# Patient Record
Sex: Male | Born: 1960 | Race: White | Hispanic: Yes | Marital: Married | Smoking: Never smoker
Health system: Southern US, Community
[De-identification: ages and names within clinical notes are randomized; demographics above are authoritative.]

## PROBLEM LIST (undated history)

## (undated) DIAGNOSIS — I10 Essential (primary) hypertension: Secondary | ICD-10-CM

---

## 2018-07-01 ENCOUNTER — Other Ambulatory Visit: Payer: Self-pay

## 2018-07-01 DIAGNOSIS — R6889 Other general symptoms and signs: Secondary | ICD-10-CM

## 2018-07-01 NOTE — Progress Notes (Unsigned)
[place] 

## 2018-07-07 LAB — NOVEL CORONAVIRUS, NAA: SARS-CoV-2, NAA: NOT DETECTED

## 2020-03-24 ENCOUNTER — Emergency Department (HOSPITAL_COMMUNITY): Payer: Self-pay

## 2020-03-24 ENCOUNTER — Encounter (HOSPITAL_COMMUNITY): Payer: Self-pay | Admitting: Emergency Medicine

## 2020-03-24 ENCOUNTER — Other Ambulatory Visit: Payer: Self-pay

## 2020-03-24 ENCOUNTER — Inpatient Hospital Stay (HOSPITAL_COMMUNITY)
Admission: EM | Admit: 2020-03-24 | Discharge: 2020-03-29 | DRG: 871 | Disposition: A | Discharge status: Home / Self Care | Payer: Self-pay | Attending: Internal Medicine | Admitting: Internal Medicine

## 2020-03-24 DIAGNOSIS — R7989 Other specified abnormal findings of blood chemistry: Secondary | ICD-10-CM | POA: Diagnosis present

## 2020-03-24 DIAGNOSIS — A044 Other intestinal Escherichia coli infections: Secondary | ICD-10-CM | POA: Diagnosis present

## 2020-03-24 DIAGNOSIS — Z20822 Contact with and (suspected) exposure to covid-19: Secondary | ICD-10-CM | POA: Diagnosis present

## 2020-03-24 DIAGNOSIS — R911 Solitary pulmonary nodule: Secondary | ICD-10-CM | POA: Diagnosis present

## 2020-03-24 DIAGNOSIS — A419 Sepsis, unspecified organism: Secondary | ICD-10-CM | POA: Diagnosis present

## 2020-03-24 DIAGNOSIS — E872 Acidosis: Secondary | ICD-10-CM | POA: Diagnosis present

## 2020-03-24 DIAGNOSIS — R972 Elevated prostate specific antigen [PSA]: Secondary | ICD-10-CM | POA: Diagnosis present

## 2020-03-24 DIAGNOSIS — N179 Acute kidney failure, unspecified: Secondary | ICD-10-CM | POA: Diagnosis present

## 2020-03-24 DIAGNOSIS — E669 Obesity, unspecified: Secondary | ICD-10-CM | POA: Diagnosis present

## 2020-03-24 DIAGNOSIS — D649 Anemia, unspecified: Secondary | ICD-10-CM | POA: Diagnosis present

## 2020-03-24 DIAGNOSIS — R778 Other specified abnormalities of plasma proteins: Secondary | ICD-10-CM | POA: Diagnosis present

## 2020-03-24 DIAGNOSIS — K8689 Other specified diseases of pancreas: Secondary | ICD-10-CM | POA: Diagnosis present

## 2020-03-24 DIAGNOSIS — K869 Disease of pancreas, unspecified: Secondary | ICD-10-CM | POA: Diagnosis present

## 2020-03-24 DIAGNOSIS — Z6831 Body mass index (BMI) 31.0-31.9, adult: Secondary | ICD-10-CM

## 2020-03-24 DIAGNOSIS — I248 Other forms of acute ischemic heart disease: Secondary | ICD-10-CM | POA: Diagnosis present

## 2020-03-24 DIAGNOSIS — A4152 Sepsis due to Pseudomonas: Secondary | ICD-10-CM | POA: Diagnosis present

## 2020-03-24 DIAGNOSIS — K529 Noninfective gastroenteritis and colitis, unspecified: Secondary | ICD-10-CM

## 2020-03-24 DIAGNOSIS — E871 Hypo-osmolality and hyponatremia: Secondary | ICD-10-CM | POA: Diagnosis present

## 2020-03-24 DIAGNOSIS — I10 Essential (primary) hypertension: Secondary | ICD-10-CM | POA: Diagnosis present

## 2020-03-24 DIAGNOSIS — E876 Hypokalemia: Secondary | ICD-10-CM | POA: Diagnosis present

## 2020-03-24 DIAGNOSIS — A4151 Sepsis due to Escherichia coli [E. coli]: Principal | ICD-10-CM | POA: Diagnosis present

## 2020-03-24 DIAGNOSIS — R6521 Severe sepsis with septic shock: Secondary | ICD-10-CM | POA: Diagnosis present

## 2020-03-24 DIAGNOSIS — E861 Hypovolemia: Secondary | ICD-10-CM | POA: Diagnosis present

## 2020-03-24 HISTORY — DX: Essential (primary) hypertension: I10

## 2020-03-24 LAB — LACTIC ACID, PLASMA: Lactic Acid, Venous: 5.6 mmol/L (ref 0.5–1.9)

## 2020-03-24 LAB — COMPREHENSIVE METABOLIC PANEL
ALT: 93 U/L — ABNORMAL HIGH (ref 0–44)
AST: 137 U/L — ABNORMAL HIGH (ref 15–41)
Albumin: 3.2 g/dL — ABNORMAL LOW (ref 3.5–5.0)
Alkaline Phosphatase: 49 U/L (ref 38–126)
Anion gap: 12 (ref 5–15)
BUN: 21 mg/dL — ABNORMAL HIGH (ref 6–20)
CO2: 19 mmol/L — ABNORMAL LOW (ref 22–32)
Calcium: 8.3 mg/dL — ABNORMAL LOW (ref 8.9–10.3)
Chloride: 101 mmol/L (ref 98–111)
Creatinine, Ser: 1.98 mg/dL — ABNORMAL HIGH (ref 0.61–1.24)
GFR, Estimated: 39 mL/min — ABNORMAL LOW (ref >60)
Glucose, Bld: 113 mg/dL — ABNORMAL HIGH (ref 70–99)
Potassium: 3.2 mmol/L — ABNORMAL LOW (ref 3.5–5.1)
Sodium: 132 mmol/L — ABNORMAL LOW (ref 135–145)
Total Bilirubin: 0.8 mg/dL (ref 0.3–1.2)
Total Protein: 6.5 g/dL (ref 6.5–8.1)

## 2020-03-24 LAB — TYPE AND SCREEN
ABO/RH(D): O POS
Antibody Screen: NEGATIVE

## 2020-03-24 LAB — TROPONIN I (HIGH SENSITIVITY): Troponin I (High Sensitivity): 1266 ng/L (ref <18)

## 2020-03-24 LAB — CBC WITH DIFFERENTIAL/PLATELET
Abs Immature Granulocytes: 0.2 10*3/uL — ABNORMAL HIGH (ref 0.00–0.07)
Basophils Absolute: 0.1 10*3/uL (ref 0.0–0.1)
Basophils Relative: 1 %
Eosinophils Absolute: 0 10*3/uL (ref 0.0–0.5)
Eosinophils Relative: 1 %
HCT: 46.4 % (ref 39.0–52.0)
Hemoglobin: 16.2 g/dL (ref 13.0–17.0)
Immature Granulocytes: 3 %
Lymphocytes Relative: 22 %
Lymphs Abs: 1.3 10*3/uL (ref 0.7–4.0)
MCH: 32.9 pg (ref 26.0–34.0)
MCHC: 34.9 g/dL (ref 30.0–36.0)
MCV: 94.3 fL (ref 80.0–100.0)
Monocytes Absolute: 0.1 10*3/uL (ref 0.1–1.0)
Monocytes Relative: 2 %
Neutro Abs: 4.1 10*3/uL (ref 1.7–7.7)
Neutrophils Relative %: 71 %
Platelets: 189 10*3/uL (ref 150–400)
RBC: 4.92 MIL/uL (ref 4.22–5.81)
RDW: 13.3 % (ref 11.5–15.5)
WBC: 5.6 10*3/uL (ref 4.0–10.5)
nRBC: 0 % (ref 0.0–0.2)

## 2020-03-24 LAB — I-STAT CHEM 8, ED
BUN: 22 mg/dL — ABNORMAL HIGH (ref 6–20)
Calcium, Ion: 1.14 mmol/L — ABNORMAL LOW (ref 1.15–1.40)
Chloride: 100 mmol/L (ref 98–111)
Creatinine, Ser: 1.8 mg/dL — ABNORMAL HIGH (ref 0.61–1.24)
Glucose, Bld: 106 mg/dL — ABNORMAL HIGH (ref 70–99)
HCT: 40 % (ref 39.0–52.0)
Hemoglobin: 13.6 g/dL (ref 13.0–17.0)
Potassium: 3.3 mmol/L — ABNORMAL LOW (ref 3.5–5.1)
Sodium: 133 mmol/L — ABNORMAL LOW (ref 135–145)
TCO2: 20 mmol/L — ABNORMAL LOW (ref 22–32)

## 2020-03-24 LAB — PROTIME-INR
INR: 1 (ref 0.8–1.2)
Prothrombin Time: 13 seconds (ref 11.4–15.2)

## 2020-03-24 LAB — APTT: aPTT: 22 seconds — ABNORMAL LOW (ref 24–36)

## 2020-03-24 LAB — ABO/RH: ABO/RH(D): O POS

## 2020-03-24 MED ORDER — ACETAMINOPHEN 650 MG RE SUPP: 650.0000 mg | Freq: Four times a day (QID) | RECTAL | Status: DC | PRN

## 2020-03-24 MED ORDER — SODIUM CHLORIDE 0.9 % IV SOLN: INTRAVENOUS | Status: DC

## 2020-03-24 MED ORDER — ACETAMINOPHEN 325 MG PO TABS
650.0000 mg | ORAL_TABLET | Freq: Four times a day (QID) | ORAL | Status: DC | PRN
  Administered 2020-03-27 – 2020-03-28 (×4): 650 mg via ORAL
  Filled 2020-03-24 (×5): qty 2

## 2020-03-24 MED ORDER — ONDANSETRON HCL 4 MG/2ML IJ SOLN: 4.0000 mg | Freq: Four times a day (QID) | INTRAMUSCULAR | Status: DC | PRN

## 2020-03-24 MED ORDER — VANCOMYCIN HCL IN DEXTROSE 1-5 GM/200ML-% IV SOLN
1000.0000 mg | Freq: Once | INTRAVENOUS | Status: AC
  Administered 2020-03-25: 01:00:00 1000 mg via INTRAVENOUS
  Filled 2020-03-24: qty 200

## 2020-03-24 MED ORDER — ACETAMINOPHEN 500 MG PO TABS
1000.0000 mg | ORAL_TABLET | Freq: Once | ORAL | Status: AC
  Administered 2020-03-25: 1000 mg via ORAL
  Filled 2020-03-24: qty 2

## 2020-03-24 MED ORDER — ONDANSETRON HCL 4 MG PO TABS: 4.0000 mg | ORAL_TABLET | Freq: Four times a day (QID) | ORAL | Status: DC | PRN

## 2020-03-24 MED ORDER — LACTATED RINGERS IV BOLUS
1000.0000 mL | Freq: Once | INTRAVENOUS | Status: AC
  Administered 2020-03-25: 01:00:00 1000 mL via INTRAVENOUS

## 2020-03-24 MED ORDER — SODIUM CHLORIDE 0.9 % IV BOLUS
1000.0000 mL | Freq: Once | INTRAVENOUS | Status: AC
  Administered 2020-03-24: 1000 mL via INTRAVENOUS

## 2020-03-24 MED ORDER — SODIUM CHLORIDE 0.9 % IV SOLN
1.0000 g | Freq: Once | INTRAVENOUS | Status: DC
  Filled 2020-03-24: qty 10

## 2020-03-24 MED ORDER — SODIUM CHLORIDE 0.9 % IV SOLN
2.0000 g | Freq: Once | INTRAVENOUS | Status: AC
  Administered 2020-03-25: 01:00:00 2 g via INTRAVENOUS
  Filled 2020-03-24: qty 2

## 2020-03-24 MED ORDER — IOHEXOL 350 MG/ML SOLN
100.0000 mL | Freq: Once | INTRAVENOUS | Status: AC | PRN
  Administered 2020-03-24: 100 mL via INTRAVENOUS

## 2020-03-24 MED ORDER — ENOXAPARIN SODIUM 40 MG/0.4ML ~~LOC~~ SOLN
40.0000 mg | SUBCUTANEOUS | Status: DC
  Administered 2020-03-25 – 2020-03-27 (×3): 40 mg via SUBCUTANEOUS
  Filled 2020-03-24 (×4): qty 0.4

## 2020-03-24 MED ORDER — METRONIDAZOLE 500 MG PO TABS
500.0000 mg | ORAL_TABLET | Freq: Three times a day (TID) | ORAL | Status: DC
  Administered 2020-03-25 – 2020-03-29 (×12): 500 mg via ORAL
  Filled 2020-03-24 (×12): qty 1

## 2020-03-24 NOTE — ED Provider Notes (Signed)
11:53 PM Assumed care.  Patient here with rigors, diaphoresis, hypotension.  Concern for possible sepsis.  CT showed no dissection or large pulmonary embolus.  Rectal temperature here 101.3.  Blood pressure improving with IV fluids.  He has received 30 mL/kg IV fluid bolus.  Broad-spectrum antibiotics have been ordered.  Unclear source at this time - ? colitis.  CT scan shows possible colitis and he has had copious diarrhea here per nursing staff but is on stool softeners per previous provider.  We will add on stool studies. Troponin also elevated but patient complains of no chest pain and EKG is nonischemic.  Likely secondary to sepsis.  Urine, blood cultures, Covid pending.     Discussed patient's case with hospitalist, Dr. Julian Reil.  I have recommended admission and patient (and family if present) agree with this plan. Admitting physician will place admission orders.   I reviewed all nursing notes, vitals, pertinent previous records and reviewed/interpreted all EKGs, lab and urine results, imaging (as available).     EKG Interpretation  Date/Time:  Friday March 24 2020 23:46:44 EST Ventricular Rate:  98 PR Interval:    QRS Duration: 107 QT Interval:  333 QTC Calculation: 426 R Axis:   29 Text Interpretation: Sinus rhythm Rate improved compared to prior Confirmed by Timya Trimmer, Baxter Hire [phone]) on 03/24/2020 11:49:11 PM       CRITICAL CARE Performed by: Baxter Hire Amarra Sawyer   Total critical care time: 35 minutes  Critical care time was exclusive of separately billable procedures and treating other patients.  Critical care was necessary to treat or prevent imminent or life-threatening deterioration.  Critical care was time spent personally by me on the following activities: development of treatment plan with patient and/or surrogate as well as nursing, discussions with consultants, evaluation of patient's response to treatment, examination of patient, obtaining history from patient or surrogate,  ordering and performing treatments and interventions, ordering and review of laboratory studies, ordering and review of radiographic studies, pulse oximetry and re-evaluation of patient's condition.    Ginger Leeth, Layla Maw, DO 03/25/20 0005

## 2020-03-24 NOTE — ED Notes (Signed)
Date and time results received: 03/24/20 Test: Lactic Critical Value: 5.6 Name of Provider Notified: Hyacinth Meeker

## 2020-03-24 NOTE — ED Notes (Signed)
Pt transported to CT at this time.

## 2020-03-24 NOTE — ED Provider Notes (Signed)
[facility] EMERGENCY DEPARTMENT Provider Note   CSN: [id number] Arrival date & time: 03/24/20  2056     History Chief Complaint  Patient presents with  . Near Syncope    Pt brought to ED by EMS from home for near syncope episode, AMS and CP, pt very anxious, and diaphoretic on arrival to ED. HR 130, BP 80/50, SPO2 90 on RA, 98% 2L [place], CBG 130    Perry Zamora is a 59 y.o. male.  HPI   Patient is an ill-appearing 59 year old male arriving by paramedic transport after being found to be tachycardic at home, he had a near syncopal episode, the patient was diaphoretic, tachycardic to 140, hypotensive at 80/50 and oxygen of 90% on room air.  He reports that he has had some recent back discomfort, he feels like he is trembling all over, very shaky, having a hard time breathing and occasional chest pain though he is not having any chest pain at this time.  He reports having mild hypertension for which he takes a small amount of blood pressure medication, cannot recall the name, he is not a diabetic, he does not smoke, he was a heavy drinker until 6 months ago when he went and stopped drinking.  He is not had any alcohol in 6 months.   Past Medical History:  Diagnosis Date  . HTN (hypertension)     Patient Active Problem List   Diagnosis Date Noted  . Pulmonary nodule 03/25/2020  . 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 AKI (acute kidney injury) (HCC) 03/24/2020  . Elevated troponin 03/24/2020  . Severe sepsis with septic shock (HCC) 03/24/2020    History reviewed. No pertinent surgical history.     History reviewed. No pertinent family history.  Social History   Tobacco Use  . Smoking status: Never Smoker  . Smokeless tobacco: Never Used  Vaping Use  . Vaping Use: Never used  Substance Use Topics  . Alcohol use: Not Currently    Comment: heavy until Quit 6 months ago  . Drug use: Never    Home Medications Prior to Admission medications   Medication Sig  Start Date End Date Taking? Authorizing Provider  hydroxypropyl methylcellulose / hypromellose (ISOPTO TEARS / GONIOVISC) 2.5 % ophthalmic solution Place 1 drop into both eyes 4 (four) times daily as needed for dry eyes.   Yes [provider]  lisinopril-hydrochlorothiazide (ZESTORETIC) 10-12.5 MG tablet Take 1 tablet by mouth daily. 11/14/19  Yes [provider]    Allergies    Patient has no known allergies.  Review of Systems   Review of Systems  All other systems reviewed and are negative.   Physical Exam Updated Vital Signs BP (!) 143/102 (BP Location: Left Arm)   Pulse 61   Temp 98.7 F (37.1 C) (Oral)   Resp 17   Ht 1.6 m (5\' 3" )   Wt 79.1 kg   SpO2 98%   BMI 30.89 kg/m   Physical Exam Vitals and nursing note reviewed.  Constitutional:      General: He is not in acute distress.    Appearance: He is well-developed and well-nourished. He is ill-appearing and diaphoretic.  HENT:     Head: Normocephalic and atraumatic.     Mouth/Throat:     Mouth: Oropharynx is clear and moist.     Pharynx: No oropharyngeal exudate.  Eyes:     General: No scleral icterus.       Right eye: No discharge.  Left eye: No discharge.     Extraocular Movements: EOM normal.     Conjunctiva/sclera: Conjunctivae normal.     Pupils: Pupils are equal, round, and reactive to light.  Neck:     Thyroid: No thyromegaly.     Vascular: No JVD.  Cardiovascular:     Rate and Rhythm: Regular rhythm. Tachycardia present.     Pulses: Intact distal pulses.     Heart sounds: Normal heart sounds. No murmur heard. No friction rub. No gallop.   Pulmonary:     Effort: Pulmonary effort is normal. No respiratory distress.     Breath sounds: Normal breath sounds. No wheezing or rales.  Abdominal:     General: Bowel sounds are normal. There is no distension.     Palpations: Abdomen is soft. There is no mass.     Tenderness: There is no abdominal tenderness.  Musculoskeletal:         General: No tenderness or edema. Normal range of motion.     Cervical back: Normal range of motion and neck supple.  Lymphadenopathy:     Cervical: No cervical adenopathy.  Skin:    General: Skin is warm.     Findings: No erythema or rash.  Neurological:     Mental Status: He is alert.     Coordination: Coordination normal.  Psychiatric:        Mood and Affect: Mood and affect normal.        Behavior: Behavior normal.     ED Results / Procedures / Treatments   Labs (all labs ordered are listed, but only abnormal results are displayed) Labs Reviewed  BLOOD CULTURE ID PANEL (REFLEXED) - BCID2 - Abnormal; Notable for the following components:      Result Value   Enterobacterales DETECTED (*)    Escherichia coli DETECTED (*)    Pseudomonas aeruginosa DETECTED (*)    All other components within normal limits  LACTIC ACID, PLASMA - Abnormal; Notable for the following components:   Lactic Acid, Venous 5.6 (*)    All other components within normal limits  CBC WITH DIFFERENTIAL/PLATELET - Abnormal; Notable for the following components:   Abs Immature Granulocytes 0.20 (*)    All other components within normal limits  URINALYSIS, ROUTINE W REFLEX MICROSCOPIC - Abnormal; Notable for the following components:   APPearance HAZY (*)    Specific Gravity, Urine >1.046 (*)    Hgb urine dipstick MODERATE (*)    Protein, ur 30 (*)    All other components within normal limits  APTT - Abnormal; Notable for the following components:   aPTT 22 (*)    All other components within normal limits  COMPREHENSIVE METABOLIC PANEL - Abnormal; Notable for the following components:   Sodium 132 (*)    Potassium 3.2 (*)    CO2 19 (*)    Glucose, Bld 113 (*)    BUN 21 (*)    Creatinine, Ser 1.98 (*)    Calcium 8.3 (*)    Albumin 3.2 (*)    AST 137 (*)    ALT 93 (*)    GFR, Estimated 39 (*)    All other components within normal limits  LACTIC ACID, PLASMA - Abnormal; Notable for the following  components:   Lactic Acid, Venous 2.0 (*)    All other components within normal limits  CBC - Abnormal; Notable for the following components:   RBC 3.93 (*)    HCT 37.3 (*)    All other components  within normal limits  COMPREHENSIVE METABOLIC PANEL - Abnormal; Notable for the following components:   Sodium 131 (*)    CO2 21 (*)    Glucose, Bld 120 (*)    Creatinine, Ser 1.60 (*)    Calcium 8.6 (*)    Albumin 3.2 (*)    AST 272 (*)    ALT 162 (*)    GFR, Estimated 50 (*)    All other components within normal limits  CBC - Abnormal; Notable for the following components:   RBC 3.70 (*)    Hemoglobin 11.9 (*)    HCT 35.5 (*)    All other components within normal limits  COMPREHENSIVE METABOLIC PANEL - Abnormal; Notable for the following components:   Sodium 134 (*)    CO2 19 (*)    Calcium 8.4 (*)    Total Protein 6.1 (*)    Albumin 2.9 (*)    AST 311 (*)    ALT 348 (*)    All other components within normal limits  HEMOGLOBIN A1C - Abnormal; Notable for the following components:   Hgb A1c MFr Bld 5.7 (*)    All other components within normal limits  CBC - Abnormal; Notable for the following components:   RBC 3.75 (*)    Hemoglobin 12.3 (*)    HCT 35.4 (*)    All other components within normal limits  COMPREHENSIVE METABOLIC PANEL - Abnormal; Notable for the following components:   Glucose, Bld 108 (*)    Calcium 8.8 (*)    Total Protein 6.2 (*)    Albumin 3.0 (*)    AST 129 (*)    ALT 228 (*)    All other components within normal limits  I-STAT CHEM 8, ED - Abnormal; Notable for the following components:   Sodium 133 (*)    Potassium 3.3 (*)    BUN 22 (*)    Creatinine, Ser 1.80 (*)    Glucose, Bld 106 (*)    Calcium, Ion 1.14 (*)    TCO2 20 (*)    All other components within normal limits  TROPONIN I (HIGH SENSITIVITY) - Abnormal; Notable for the following components:   Troponin I (High Sensitivity) 1,266 (*)    All other components within normal limits   TROPONIN I (HIGH SENSITIVITY) - Abnormal; Notable for the following components:   Troponin I (High Sensitivity) 1,390 (*)    All other components within normal limits  TROPONIN I (HIGH SENSITIVITY) - Abnormal; Notable for the following components:   Troponin I (High Sensitivity) 160 (*)    All other components within normal limits  CULTURE, BLOOD (ROUTINE X 2)  CULTURE, BLOOD (ROUTINE X 2)  RESP PANEL BY RT-PCR (FLU A&B, COVID) ARPGX2  GASTROINTESTINAL PANEL BY PCR, STOOL (REPLACES STOOL CULTURE)  C DIFFICILE QUICK SCREEN W PCR REFLEX  URINE CULTURE  MRSA PCR SCREENING  PROTIME-INR  LACTIC ACID, PLASMA  HIV ANTIBODY (ROUTINE TESTING W REFLEX)  PROTIME-INR  CORTISOL-AM, BLOOD  PROCALCITONIN  MAGNESIUM  HEPATITIS PANEL, ACUTE  PROCALCITONIN  PROCALCITONIN  TYPE AND SCREEN  ABO/RH    EKG EKG Interpretation  Date/Time:  Friday March 24 2020 23:46:44 EST Ventricular Rate:  98 PR Interval:    QRS Duration: 107 QT Interval:  333 QTC Calculation: 426 R Axis:   29 Text Interpretation: Sinus rhythm Rate improved compared to prior Confirmed by Rochele RaringWard, Kristen [phone]([id number]) on 03/24/2020 11:49:11 PM Also confirmed by Ward, Baxter HireKristen [phone]), editor Erenest RasherWray, Angela ([phone])  on 03/25/2020 8:17:00  AM   Radiology MR ABDOMEN W WO CONTRAST  Result Date: 03/26/2020 CLINICAL DATA:  Characterize pancreatic lesion EXAM: MRI ABDOMEN WITHOUT AND WITH CONTRAST TECHNIQUE: Multiplanar multisequence MR imaging of the abdomen was performed both before and after the administration of intravenous contrast. CONTRAST:  29mL GADAVIST GADOBUTROL 1 MMOL/ML IV SOLN COMPARISON:  CT chest abdomen pelvis, 03/24/2020 FINDINGS: Lower chest: No acute findings. Hepatobiliary: Hepatic steatosis. No mass or other parenchymal abnormality identified. No biliary ductal dilatation. Pancreas: There is no mass or abnormal contrast enhancement in the central pancreatic head to correlate to finding of prior CT (series 21, image 57).  No other evidence of mass, inflammatory changes, or other parenchymal abnormality identified. No pancreatic ductal dilatation. Spleen:  Within normal limits in size and appearance. Adrenals/Urinary Tract: No masses identified. No evidence of hydronephrosis. Stomach/Bowel: Colonic diverticula. Visualized portions within the abdomen are otherwise unremarkable. Vascular/Lymphatic: No pathologically enlarged lymph nodes identified. No abdominal aortic aneurysm demonstrated. Other:  None. Musculoskeletal: No suspicious bone lesions identified. IMPRESSION: 1. No pancreatic mass or other significant abnormality identified. There is no MR correlate for a small lesion in the central pancreatic head suspected to be arterially hyperenhancing on prior CT, although not included in noncontrast images of the chest performed on that examination. In particular there is no focal signal abnormality or hyperenhancement on multiphasic contrast enhanced examination, and this finding on prior CT is of uncertain significance, possibly reflecting a small focus of calcification in the pancreatic parenchyma. In the absence of any suspicious MR finding, a pancreatic neuroendocrine tumor is not favored, and there are no concerning secondary features such as lymphadenopathy or pancreatic ductal dilatation. Consider follow-up CT of the abdomen without and with contrast in 6 months to assess for stability and true contrast enhancement characteristics as lesion was originally appreciated on this modality. 2. Hepatic steatosis. Electronically Signed   By: Lauralyn Primes M.D.   On: 03/26/2020 18:16    Procedures .Critical Care Performed by: Eber Hong, MD Authorized by: Eber Hong, MD   Critical care provider statement:    Critical care time (minutes):  35   Critical care time was exclusive of:  Separately billable procedures and treating other patients and teaching time   Critical care was necessary to treat or prevent imminent or  life-threatening deterioration of the following conditions:  Sepsis   Critical care was time spent personally by me on the following activities:  Blood draw for specimens, development of treatment plan with patient or surrogate, discussions with consultants, evaluation of patient's response to treatment, examination of patient, obtaining history from patient or surrogate, ordering and performing treatments and interventions, ordering and review of laboratory studies, ordering and review of radiographic studies, pulse oximetry, re-evaluation of patient's condition and review of old charts   (including critical care time)  Medications Ordered in ED Medications  metroNIDAZOLE (FLAGYL) tablet 500 mg (500 mg Oral Given 03/27/20 1438)  acetaminophen (TYLENOL) tablet 650 mg (650 mg Oral Given 03/27/20 0517)    Or  acetaminophen (TYLENOL) suppository 650 mg ( Rectal See Alternative 03/27/20 0517)  ondansetron (ZOFRAN) tablet 4 mg (has no administration in time range)    Or  ondansetron (ZOFRAN) injection 4 mg (has no administration in time range)  enoxaparin (LOVENOX) injection 40 mg (40 mg Subcutaneous Given 03/27/20 0517)  aspirin EC tablet 81 mg (81 mg Oral Given 03/27/20 1002)  LORazepam (ATIVAN) injection 0.5 mg (has no administration in time range)  ceFEPIme (MAXIPIME) 2 g in sodium chloride 0.9 %  100 mL IVPB (2 g Intravenous New Bag/Given 03/27/20 1441)  sodium chloride 0.9 % bolus 1,000 mL (0 mLs Intravenous Stopped 03/24/20 2301)  sodium chloride 0.9 % bolus 1,000 mL (0 mLs Intravenous Stopped 03/24/20 2300)  iohexol (OMNIPAQUE) 350 MG/ML injection 100 mL (100 mLs Intravenous Contrast Given 03/24/20 2120)  acetaminophen (TYLENOL) tablet 1,000 mg (1,000 mg Oral Given 03/25/20 0014)  lactated ringers bolus 1,000 mL (0 mLs Intravenous Stopped 03/25/20 0118)  ceFEPIme (MAXIPIME) 2 g in sodium chloride 0.9 % 100 mL IVPB (0 g Intravenous Stopped 03/25/20 0118)  vancomycin (VANCOCIN) IVPB 1000  mg/200 mL premix (0 mg Intravenous Stopped 03/25/20 0217)  potassium chloride SA (KLOR-CON) CR tablet 40 mEq (40 mEq Oral Given 03/25/20 0457)  gadobutrol (GADAVIST) 1 MMOL/ML injection 8 mL (8 mLs Intravenous Contrast Given 03/26/20 1546)  potassium chloride SA (KLOR-CON) CR tablet 40 mEq (40 mEq Oral Given 03/27/20 1007)    ED Course  I have reviewed the triage vital signs and the nursing notes.  Pertinent labs & imaging results that were available during my care of the patient were reviewed by me and considered in my medical decision making (see chart for details).    MDM Rules/Calculators/A&P                          This patient is ill-appearing, heart rate of 140, blood pressure of 85 systolic, weak pulses, no JVD, no peripheral edema and on bedside ultrasound no pericardial effusion, no abdominal aortic aneurysm or free fluid in the abdomen.  He will go for a CT scan of the chest abdomen pelvis angiogram to rule out dissection or pulmonary embolism.  EKG reveals sinus tachycardia, there does appear to be some abnormal ST and T waves which may does be related to repolarization.  Patient is critically ill, 2 L of IV fluid bolus has been started as well as blood cultures.  Pt has ongoing tachycardia and borderline hypotension - has responded to 2 L of IVF with pressures of 90-100 systolic - he has a lactic of > 5 and antibiotics have been given, he has had several days of progressive shaking spells and some urinary frequency - he has an AKI and very little fluid in the bladder after 2 L, will admit for acute kidney injury  Treat for likely sepsis.  Final Clinical Impression(s) / ED Diagnoses Final diagnoses:  Sepsis associated hypotension (HCC)  Colitis      Eber Hong, MD 03/27/20 [phone]

## 2020-03-25 ENCOUNTER — Encounter (HOSPITAL_COMMUNITY): Payer: Self-pay | Admitting: Internal Medicine

## 2020-03-25 ENCOUNTER — Inpatient Hospital Stay (HOSPITAL_COMMUNITY): Payer: Self-pay

## 2020-03-25 DIAGNOSIS — R6521 Severe sepsis with septic shock: Secondary | ICD-10-CM

## 2020-03-25 DIAGNOSIS — R911 Solitary pulmonary nodule: Secondary | ICD-10-CM

## 2020-03-25 DIAGNOSIS — I959 Hypotension, unspecified: Secondary | ICD-10-CM

## 2020-03-25 DIAGNOSIS — K8689 Other specified diseases of pancreas: Secondary | ICD-10-CM

## 2020-03-25 DIAGNOSIS — A419 Sepsis, unspecified organism: Secondary | ICD-10-CM

## 2020-03-25 DIAGNOSIS — R55 Syncope and collapse: Secondary | ICD-10-CM

## 2020-03-25 LAB — C DIFFICILE QUICK SCREEN W PCR REFLEX
C Diff antigen: NEGATIVE
C Diff interpretation: NOT DETECTED
C Diff toxin: NEGATIVE

## 2020-03-25 LAB — GASTROINTESTINAL PANEL BY PCR, STOOL (REPLACES STOOL CULTURE)

## 2020-03-25 LAB — URINALYSIS, ROUTINE W REFLEX MICROSCOPIC
Bacteria, UA: NONE SEEN
Bilirubin Urine: NEGATIVE
Glucose, UA: NEGATIVE mg/dL
Ketones, ur: NEGATIVE mg/dL
Leukocytes,Ua: NEGATIVE
Nitrite: NEGATIVE
Protein, ur: 30 mg/dL — AB
Specific Gravity, Urine: >1.046 — ABNORMAL HIGH (ref 1.005–1.030)
pH: 5 (ref 5.0–8.0)

## 2020-03-25 LAB — HIV ANTIBODY (ROUTINE TESTING W REFLEX): HIV Screen 4th Generation wRfx: NONREACTIVE

## 2020-03-25 LAB — ECHOCARDIOGRAM COMPLETE
Area-P 1/2: 3.17 cm2
Height: 63 in
S' Lateral: 3 cm
Weight: 2790.4 oz

## 2020-03-25 LAB — CBC
HCT: 37.3 % — ABNORMAL LOW (ref 39.0–52.0)
Hemoglobin: 13.1 g/dL (ref 13.0–17.0)
MCH: 33.3 pg (ref 26.0–34.0)
MCHC: 35.1 g/dL (ref 30.0–36.0)
MCV: 94.9 fL (ref 80.0–100.0)
Platelets: 156 10*3/uL (ref 150–400)
RBC: 3.93 MIL/uL — ABNORMAL LOW (ref 4.22–5.81)
RDW: 13.2 % (ref 11.5–15.5)
WBC: 8.6 10*3/uL (ref 4.0–10.5)
nRBC: 0 % (ref 0.0–0.2)

## 2020-03-25 LAB — COMPREHENSIVE METABOLIC PANEL
ALT: 162 U/L — ABNORMAL HIGH (ref 0–44)
AST: 272 U/L — ABNORMAL HIGH (ref 15–41)
Albumin: 3.2 g/dL — ABNORMAL LOW (ref 3.5–5.0)
Alkaline Phosphatase: 43 U/L (ref 38–126)
Anion gap: 8 (ref 5–15)
BUN: 19 mg/dL (ref 6–20)
CO2: 21 mmol/L — ABNORMAL LOW (ref 22–32)
Calcium: 8.6 mg/dL — ABNORMAL LOW (ref 8.9–10.3)
Chloride: 102 mmol/L (ref 98–111)
Creatinine, Ser: 1.6 mg/dL — ABNORMAL HIGH (ref 0.61–1.24)
GFR, Estimated: 50 mL/min — ABNORMAL LOW (ref >60)
Glucose, Bld: 120 mg/dL — ABNORMAL HIGH (ref 70–99)
Potassium: 3.7 mmol/L (ref 3.5–5.1)
Sodium: 131 mmol/L — ABNORMAL LOW (ref 135–145)
Total Bilirubin: 0.4 mg/dL (ref 0.3–1.2)
Total Protein: 6.8 g/dL (ref 6.5–8.1)

## 2020-03-25 LAB — RESP PANEL BY RT-PCR (FLU A&B, COVID) ARPGX2
Influenza A by PCR: NEGATIVE
Influenza B by PCR: NEGATIVE
SARS Coronavirus 2 by RT PCR: NEGATIVE

## 2020-03-25 LAB — PROCALCITONIN: Procalcitonin: 52 ng/mL

## 2020-03-25 LAB — MRSA PCR SCREENING: MRSA by PCR: NEGATIVE

## 2020-03-25 LAB — PROTIME-INR
INR: 1.2 (ref 0.8–1.2)
Prothrombin Time: 14.7 seconds (ref 11.4–15.2)

## 2020-03-25 LAB — CORTISOL-AM, BLOOD: Cortisol - AM: 21.4 ug/dL (ref 6.7–22.6)

## 2020-03-25 LAB — LACTIC ACID, PLASMA
Lactic Acid, Venous: 1.4 mmol/L (ref 0.5–1.9)
Lactic Acid, Venous: 2 mmol/L (ref 0.5–1.9)

## 2020-03-25 LAB — TROPONIN I (HIGH SENSITIVITY): Troponin I (High Sensitivity): 1390 ng/L (ref <18)

## 2020-03-25 MED ORDER — ASPIRIN EC 81 MG PO TBEC
81.0000 mg | DELAYED_RELEASE_TABLET | Freq: Every day | ORAL | Status: DC
  Administered 2020-03-25 – 2020-03-29 (×5): 81 mg via ORAL
  Filled 2020-03-25 (×5): qty 1

## 2020-03-25 MED ORDER — POTASSIUM CHLORIDE CRYS ER 20 MEQ PO TBCR
40.0000 meq | EXTENDED_RELEASE_TABLET | Freq: Once | ORAL | Status: AC
  Administered 2020-03-25: 05:00:00 40 meq via ORAL
  Filled 2020-03-25: qty 2

## 2020-03-25 MED ORDER — LACTATED RINGERS IV SOLN: INTRAVENOUS | Status: DC

## 2020-03-25 MED ORDER — SODIUM CHLORIDE 0.9 % IV SOLN
2.0000 g | Freq: Two times a day (BID) | INTRAVENOUS | Status: DC
  Administered 2020-03-25 – 2020-03-26 (×3): 2 g via INTRAVENOUS
  Filled 2020-03-25 (×3): qty 2

## 2020-03-25 MED ORDER — VANCOMYCIN HCL IN DEXTROSE 1-5 GM/200ML-% IV SOLN
1000.0000 mg | INTRAVENOUS | Status: DC
  Administered 2020-03-26: 06:00:00 1000 mg via INTRAVENOUS
  Filled 2020-03-25 (×2): qty 200

## 2020-03-25 NOTE — Progress Notes (Signed)
Pharmacy Antibiotic Note  Perry Zamora is a 59 y.o. male admitted on 03/24/2020 with sepsis.  Pharmacy has been consulted for vancomycin and cefepime dosing. Cefepime 2gm and vancomycin 1gm ordered in ED  Plan: Continue cefepime 2gm IV q12 hours Vancomycin 1gm IV q24 hours F.u renal function, cultures and clinical course  Height: 5\' 3"  (160 cm) Weight: 63.5 kg (140 lb) IBW/kg (Calculated) : 56.9  Temp (24hrs), Avg:100.5 F (38.1 C), Min:99.4 F (37.4 C), Max:101.6 F (38.7 C)  Recent Labs  Lab 03/24/20 2116 03/24/20 2125 03/24/20 2239 03/24/20 2247  WBC 5.6  --   --   --   CREATININE  --   --  1.98* 1.80*  LATICACIDVEN  --  5.6*  --   --     Estimated Creatinine Clearance: 36.4 mL/min (A) (by C-G formula based on SCr of 1.8 mg/dL (H)).    No Known Allergies  Thank you for allowing pharmacy to be a part of this patient's care.  [name] 03/25/2020 12:09 AM

## 2020-03-25 NOTE — ED Notes (Signed)
Report given to 6E. 

## 2020-03-25 NOTE — Progress Notes (Signed)
[facility] PROGRESS NOTE   Perry Zamora XBM:[id number] DOB: 1960-06-03 DOA: 03/24/2020  PCP: Patient, No Pcp Per  Brief History/Interval Summary: 59 y.o. male with medical history significant of HTN on just lisinopril.  Has not followed up with his primary care provider in many years.  Presented with acute diarrhea feeling poorly.  Noted to be hypotensive in the emergency department.  Was also tachycardic and diaphoretic.  Had a fever.  Was given IV fluid bolus.  Underwent CT scan which raised concern for colitis.  He was hospitalized for further management.  Reason for Visit: Severe sepsis with septic shock  Consultants: None  Procedures: None yet  Antibiotics: Anti-infectives (From admission, onward)   Start     Dose/Rate Route Frequency Ordered Stop   03/26/20 0000  vancomycin (VANCOCIN) IVPB 1000 mg/200 mL premix        1,000 mg 200 mL/hr over 60 Minutes Intravenous Every 24 hours 03/25/20 0017     03/25/20 1000  ceFEPIme (MAXIPIME) 2 g in sodium chloride 0.9 % 100 mL IVPB        2 g 200 mL/hr over 30 Minutes Intravenous Every 12 hours 03/25/20 0017     03/25/20 0000  ceFEPIme (MAXIPIME) 2 g in sodium chloride 0.9 % 100 mL IVPB        2 g 200 mL/hr over 30 Minutes Intravenous  Once 03/24/20 2359 03/25/20 0118   03/25/20 0000  vancomycin (VANCOCIN) IVPB 1000 mg/200 mL premix        1,000 mg 200 mL/hr over 60 Minutes Intravenous  Once 03/24/20 2359 03/25/20 0217   03/25/20 0000  metroNIDAZOLE (FLAGYL) tablet 500 mg        500 mg Oral Every 8 hours 03/24/20 2359     03/24/20 2330  cefTRIAXone (ROCEPHIN) 1 g in sodium chloride 0.9 % 100 mL IVPB  Status:  Discontinued        1 g 200 mL/hr over 30 Minutes Intravenous  Once 03/24/20 2319 03/25/20 0000      Subjective/Interval History: Patient states that he is feeling much better this morning.  Continues to feel a little fatigued but denies any abdominal pain nausea vomiting.  Has not had any diarrhea since he has been  up on the floor.  No chest pain or shortness of breath    Assessment/Plan:  Acute colitis with severe sepsis with septic shock No other infectious etiology found.  Elevated lactic acid level at 5.6 and was noted to be hypotensive and tachycardic.  Procalcitonin level noted to be extremely elevated at.  Surprisingly patient did not have any leukocytosis however.  Cortisol level 21.4.  C. difficile testing negative.  GI pathogen panel is pending.  Continue broad-spectrum antibiotics with vancomycin and cefepime for now.  He is also on metronidazole.  Follow-up on cultures.  MRSA PCR is negative.  If cultures remain negative anticipate vancomycin can be discontinued tomorrow. We will also trend procalcitonin levels. Lactic acid level has improved.    Acute kidney injury/hyponatremia/hypokalemia Creatinine 1.98 at presentation.  Improved to 1.6 this morning.  Likely due to hypovolemia and sepsis.  Continue to hydrate.  Monitor urine output.  Monitor sodium levels.  Potassium noted to be better.  Transaminitis Most likely due to sepsis.  Does not have any abdominal tenderness on the right upper quadrant.  CT scan did not raise any concern for any hepatobiliary process.  Continue to trend LFTs.  Check hepatitis panel.  Elevated troponin Patient denies any chest  pain.  Initial troponin was 1266, then 1390.  EKG.  Patient denies any history of heart disease.  We will proceed with echocardiogram.  This could be due to demand ischemia.  No active bleeding noted.  We will place him on aspirin for now.  Pancreatic nodule Incidentally noted on CT scan.  Will need MRI at some point in time.  May need to do this in the hospital prior to discharge since the patient does not have reliable follow-up in the outpatient setting.  Pulmonary nodule He denies being a smoker.  May not need further work-up or repeat CT in 1 year could be considered.  Obesity Estimated body mass index is 30.89 kg/m as calculated  from the following:   Height as of this encounter: 5\' 3"  (1.6 m).   Weight as of this encounter: 79.1 kg.   DVT Prophylaxis: Lovenox Code Status: Full code Family Communication: Discussed with the patient.  No family at bedside Disposition Plan: Hopefully return home when improved  Status is: Inpatient  Remains inpatient appropriate because:IV treatments appropriate due to intensity of illness or inability to take PO and Inpatient level of care appropriate due to severity of illness   Dispo: The patient is from: Home              Anticipated d/c is to: Home              Anticipated d/c date is: 2 days              Patient currently is not medically stable to d/c.       Medications:  Scheduled: . enoxaparin (LOVENOX) injection  40 mg Subcutaneous Q24H  . metroNIDAZOLE  500 mg Oral Q8H   Continuous: . ceFEPime (MAXIPIME) IV 2 g (03/25/20 0943)  . lactated ringers 150 mL/hr at 03/25/20 0305  . [START ON 03/26/2020] vancomycin     14/03/2020 **OR** acetaminophen, ondansetron **OR** ondansetron (ZOFRAN) IV   Objective:  Vital Signs  Vitals:   03/25/20 0315 03/25/20 0400 03/25/20 0417 03/25/20 0900  BP: 98/71  91/70 91/68  Pulse: 73  66 60  Resp: (!) 24  16 16   Temp:   97.7 F (36.5 C) 98.2 F (36.8 C)  TempSrc:   Oral Oral  SpO2: 96%  99% 98%  Weight:  79.1 kg    Height:  5\' 3"  (1.6 m)      Intake/Output Summary (Last 24 hours) at 03/25/2020 0948 Last data filed at 03/24/2020 2301 Gross per 24 hour  Intake 2000 ml  Output --  Net 2000 ml   Filed Weights   03/24/20 2137 03/25/20 0400  Weight: 63.5 kg 79.1 kg    General appearance: Awake alert.  In no distress Resp: Clear to auscultation bilaterally.  Normal effort Cardio: S1-S2 is normal regular.  No S3-S4.  No rubs murmurs or bruit GI: Abdomen is soft.  Nontender nondistended.  Bowel sounds are present normal.  No masses organomegaly Extremities: No edema.  Full range of motion of lower  extremities. Neurologic: Alert and oriented x3.  No focal neurological deficits.    Lab Results:  Data Reviewed: I have personally reviewed following labs and imaging studies  CBC: Recent Labs  Lab 03/24/20 2116 03/24/20 2247 03/25/20 0406  WBC 5.6  --  8.6  NEUTROABS 4.1  --   --   HGB 16.2 13.6 13.1  HCT 46.4 40.0 37.3*  MCV 94.3  --  94.9  PLT 189  --  156  Basic Metabolic Panel: Recent Labs  Lab 03/24/20 2239 03/24/20 2247 03/25/20 0406  NA 132* 133* 131*  K 3.2* 3.3* 3.7  CL 101 100 102  CO2 19*  --  21*  GLUCOSE 113* 106* 120*  BUN 21* 22* 19  CREATININE 1.98* 1.80* 1.60*  CALCIUM 8.3*  --  8.6*    GFR: Estimated Creatinine Clearance: 47.4 mL/min (A) (by C-G formula based on SCr of 1.6 mg/dL (H)).  Liver Function Tests: Recent Labs  Lab 03/24/20 2239 03/25/20 0406  AST 137* 272*  ALT 93* 162*  ALKPHOS 49 43  BILITOT 0.8 0.4  PROT 6.5 6.8  ALBUMIN 3.2* 3.2*     Coagulation Profile: Recent Labs  Lab 03/24/20 2121 03/25/20 0406  INR 1.0 1.2     Recent Results (from the past 240 hour(s))  Resp Panel by RT-PCR (Flu A&B, Covid) Nasopharyngeal Swab     Status: None   Collection Time: 03/25/20 12:14 AM   Specimen: Nasopharyngeal Swab; Nasopharyngeal(NP) swabs in vial transport medium  Result Value Ref Range Status   SARS Coronavirus 2 by RT PCR NEGATIVE NEGATIVE Final    Comment: (NOTE) SARS-CoV-2 target nucleic acids are NOT DETECTED.  The SARS-CoV-2 RNA is generally detectable in upper respiratory specimens during the acute phase of infection. The lowest concentration of SARS-CoV-2 viral copies this assay can detect is 138 copies/mL. A negative result does not preclude SARS-Cov-2 infection and should not be used as the sole basis for treatment or other patient management decisions. A negative result may occur with  improper specimen collection/handling, submission of specimen other than nasopharyngeal swab, presence of viral  mutation(s) within the areas targeted by this assay, and inadequate number of viral copies(<138 copies/mL). A negative result must be combined with clinical observations, patient history, and epidemiological information. The expected result is Negative.  Fact Sheet for Patients:  BloggerCourse.comhttps://www.fda.gov/media/152166/download  Fact Sheet for Healthcare Providers:  SeriousBroker.ithttps://www.fda.gov/media/152162/download  This test is no t yet approved or cleared by the [place] FDA and  has been authorized for detection and/or diagnosis of SARS-CoV-2 by FDA under an Emergency Use Authorization (EUA). This EUA will remain  in effect (meaning this test can be used) for the duration of the COVID-19 declaration under Section 564(b)(1) of the Act, 21 U.S.C.section 360bbb-3(b)(1), unless the authorization is terminated  or revoked sooner.       Influenza A by PCR NEGATIVE NEGATIVE Final   Influenza B by PCR NEGATIVE NEGATIVE Final    Comment: (NOTE) The Xpert Xpress SARS-CoV-2/FLU/RSV plus assay is intended as an aid in the diagnosis of influenza from Nasopharyngeal swab specimens and should not be used as a sole basis for treatment. Nasal washings and aspirates are unacceptable for Xpert Xpress SARS-CoV-2/FLU/RSV testing.  Fact Sheet for Patients: BloggerCourse.comhttps://www.fda.gov/media/152166/download  Fact Sheet for Healthcare Providers: SeriousBroker.ithttps://www.fda.gov/media/152162/download  This test is not yet approved or cleared by the [place] FDA and has been authorized for detection and/or diagnosis of SARS-CoV-2 by FDA under an Emergency Use Authorization (EUA). This EUA will remain in effect (meaning this test can be used) for the duration of the COVID-19 declaration under Section 564(b)(1) of the Act, 21 U.S.C. section 360bbb-3(b)(1), unless the authorization is terminated or revoked.  Performed at [facility], [address] [place], [place] [address]   C Difficile Quick Screen w PCR  reflex     Status: None   Collection Time: 03/25/20  1:35 AM   Specimen: STOOL  Result Value Ref Range Status  C Diff antigen NEGATIVE NEGATIVE Final   C Diff toxin NEGATIVE NEGATIVE Final   C Diff interpretation No C. difficile detected.  Final    Comment: Performed at [facility], [address] [place], [place] [address]  MRSA PCR Screening     Status: None   Collection Time: 03/25/20  5:06 AM   Specimen: Nasal Mucosa; Nasopharyngeal  Result Value Ref Range Status   MRSA by PCR NEGATIVE NEGATIVE Final    Comment:        The GeneXpert MRSA Assay (FDA approved for NASAL specimens only), is one component of a comprehensive MRSA colonization surveillance program. It is not intended to diagnose MRSA infection nor to guide or monitor treatment for MRSA infections. Performed at [facility], [address], [place], [place] [address]       Radiology Studies: CT Angio Chest/Abd/Pel for Dissection W and/or Wo Contrast  Result Date: 03/24/2020 CLINICAL DATA:  Abdominal pain.  Concern for aortic dissection. EXAM: CT ANGIOGRAPHY CHEST, ABDOMEN AND PELVIS TECHNIQUE: Non-contrast CT of the chest was initially obtained. Multidetector CT imaging through the chest, abdomen and pelvis was performed using the standard protocol during bolus administration of intravenous contrast. Multiplanar reconstructed images and MIPs were obtained and reviewed to evaluate the vascular anatomy. CONTRAST:  OMNIPAQUE IOHEXOL 350 MG/ML SOLN COMPARISON:  None. FINDINGS: CTA CHEST FINDINGS Cardiovascular: There is no evidence for thoracic aortic dissection or aneurysm. The heart size is normal. There is no large pulmonary embolism. Mediastinum/Nodes: -- No mediastinal lymphadenopathy. -- No hilar lymphadenopathy. -- No axillary lymphadenopathy. -- No supraclavicular lymphadenopathy. -- Normal thyroid gland where visualized. -  Unremarkable esophagus. Lungs/Pleura: There are two 4 mm pulmonary  nodules in the left lower lobe (axial series 8, image 89). There is no pneumothorax. No large pleural effusion. No focal infiltrate. The trachea is unremarkable. Musculoskeletal: No chest wall abnormality. No bony spinal canal stenosis. Review of the MIP images confirms the above findings. CTA ABDOMEN AND PELVIS FINDINGS VASCULAR Aorta: Normal caliber aorta without aneurysm, dissection, vasculitis or significant stenosis. Celiac: Patent without evidence of aneurysm, dissection, vasculitis or significant stenosis. SMA: Patent without evidence of aneurysm, dissection, vasculitis or significant stenosis. Renals: Both renal arteries are patent without evidence of aneurysm, dissection, vasculitis, fibromuscular dysplasia or significant stenosis. IMA: Patent without evidence of aneurysm, dissection, vasculitis or significant stenosis. Inflow: Patent without evidence of aneurysm, dissection, vasculitis or significant stenosis. Veins: No obvious venous abnormality within the limitations of this arterial phase study. Review of the MIP images confirms the above findings. NON-VASCULAR Hepatobiliary: The liver is normal. Normal gallbladder.There is no biliary ductal dilation. Pancreas: There is a small hyperattenuating nodule in the pancreatic head measuring approximately 7 mm (series 6, image 156). Spleen: Unremarkable. Adrenals/Urinary Tract: --Adrenal glands: Unremarkable. --Right kidney/ureter: No hydronephrosis or radiopaque kidney stones. --Left kidney/ureter: No hydronephrosis or radiopaque kidney stones. --Urinary bladder: Unremarkable. Stomach/Bowel: --Stomach/Duodenum: No hiatal hernia or other gastric abnormality. Normal duodenal course and caliber. --Small bowel: Unremarkable. --Colon: There is diffuse circumferential wall thickening the sigmoid colon. There is scattered colonic diverticula. There is liquid stool throughout the. --Appendix: Normal. Lymphatic: --No retroperitoneal lymphadenopathy. --No mesenteric  lymphadenopathy. --No pelvic or inguinal lymphadenopathy. Reproductive: The prostate gland is enlarged. Other: No ascites or free air. The abdominal wall is normal. Musculoskeletal. No acute displaced fractures. Review of the MIP images confirms the above findings. IMPRESSION: 1. No evidence for aortic dissection or aneurysm. 2. Diffuse circumferential wall thickening the sigmoid colon, consistent  with infectious or inflammatory colitis. 3. There is a small hyperattenuating nodule in the pancreatic head measuring approximately 7 mm. Further evaluation with a contrast-enhanced outpatient MRI is recommended as this could represent a neuroendocrine tumor. 4. Enlarged prostate gland. 5. There is a 4 mm pulmonary nodule in the left lower lobe. No follow-up needed if patient is low-risk. Non-contrast chest CT can be considered in 12 months if patient is high-risk. This recommendation follows the consensus statement: Guidelines for Management of Incidental Pulmonary Nodules Detected on CT Images: From the Fleischner Society 2017; Radiology 2017; 284:228-243. Aortic Atherosclerosis (ICD10-I70.0). Electronically Signed   By: Katherine Mantle M.D.   On: 03/24/2020 22:00       LOS: 0 days   Annick Dimaio Rito Ehrlich  [facility] Pager on [email or web address]  03/25/2020, 9:48 AM

## 2020-03-25 NOTE — Progress Notes (Signed)
  Echocardiogram 2D Echocardiogram has been performed.  Pieter Partridge 03/25/2020, 2:23 PM

## 2020-03-25 NOTE — H&P (Signed)
History and Physical    Perry Zamora:[id number] DOB: 15-Apr-1961 DOA: 03/24/2020  PCP: No primary care provider on file.  Patient coming from: Home  I have personally briefly reviewed patient's old medical records in [facility]  Chief Complaint: Sick, diarrhea  HPI: Perry Zamora is a 59 y.o. male with medical history significant of HTN on just lisinopril.  Pt presents to ED with c/o acute onset of illness today.  Feeling very ill initially.  Pts only localizing signs are diarrhea and headache.  Diarrhea described as watery.  Not diabetic.  Was heavy drinker until he quit 6 months ago, no EtOH in 6 months now.  Denies neck stiffness, denies chest pain, denies skin changes, rash, or foot ulcers.  No recent courses of antibiotics before today.   ED Course: Initially pt in septic shock with BP 80/50, tachy to 140s, diaphoretic, Tm 101.6 now in ED, WBC only 5.6k, Lactate also 5.6 initially.  Has creat 1.98 (presumably acute).  Got 2L NS bolus, Tachycardia resolved, BP still remains soft (mid 80s systolic).  3rd bolus with LR is starting now.  Trop 1233, pt denies CP.  EKG shows Q waves and inverted T waves in lead III.  CTA chest/abd/pelvis: 1) no big PE 2) no dissection 3) has colitis of sigmoid colon (infectious vs inflammatory) 4) has incidental 85mm pancreatic nodule - recommend f/u MRI 5) lungs clear other than a 36mm pulm nodule  UA pending.   Review of Systems: As per HPI, otherwise all review of systems negative.  Past Medical History:  Diagnosis Date  . HTN (hypertension)     History reviewed. No pertinent surgical history.   reports that he has never smoked. He does not have any smokeless tobacco history on file. He reports previous alcohol use. He reports that he does not use drugs.  No Known Allergies  No family history on file. No sick contacts in household.  Prior to Admission medications   Medication Sig Start Date End Date Taking?  Authorizing Provider  hydroxypropyl methylcellulose / hypromellose (ISOPTO TEARS / GONIOVISC) 2.5 % ophthalmic solution Place 1 drop into both eyes 4 (four) times daily as needed for dry eyes.   Yes [provider]  lisinopril-hydrochlorothiazide (ZESTORETIC) 10-12.5 MG tablet Take 1 tablet by mouth daily. 11/14/19  Yes [provider]    Physical Exam: Vitals:   03/24/20 2225 03/24/20 2230 03/24/20 2304 03/24/20 2358  BP: 101/70 100/68 92/69   Pulse: (!) 107 (!) 108 99   Resp: (!) 25 (!) 21 20   Temp:    (!) 101.6 F (38.7 C)  TempSrc:    Rectal  SpO2: 96% 97% 94%   Weight:      Height:        Constitutional: NAD, calm, comfortable Eyes: PERRL, lids and conjunctivae normal ENMT: Mucous membranes are moist. Posterior pharynx clear of any exudate or lesions.Normal dentition.  Neck: normal, supple, no masses, no thyromegaly Respiratory: clear to auscultation bilaterally, no wheezing, no crackles. Normal respiratory effort. No accessory muscle use.  Cardiovascular: Regular rate and rhythm, no murmurs / rubs / gallops. No extremity edema. 2+ pedal pulses. No carotid bruits.  Abdomen: no tenderness, no masses palpated. No hepatosplenomegaly. Bowel sounds positive.  Musculoskeletal: no clubbing / cyanosis. No joint deformity upper and lower extremities. Good ROM, no contractures. Normal muscle tone.  Skin: no rashes, lesions, ulcers. No induration Neurologic: CN 2-12 grossly intact. Sensation intact, DTR normal. Strength 5/5 in all 4.  Psychiatric: Normal judgment and insight. Alert and oriented x 3. Normal mood.    Labs on Admission: I have personally reviewed following labs and imaging studies  CBC: Recent Labs  Lab 03/24/20 2116 03/24/20 2247  WBC 5.6  --   NEUTROABS 4.1  --   HGB 16.2 13.6  HCT 46.4 40.0  MCV 94.3  --   PLT 189  --    Basic Metabolic Panel: Recent Labs  Lab 03/24/20 2239 03/24/20 2247  NA 132* 133*  K 3.2* 3.3*  CL 101 100  CO2 19*   --   GLUCOSE 113* 106*  BUN 21* 22*  CREATININE 1.98* 1.80*  CALCIUM 8.3*  --    GFR: Estimated Creatinine Clearance: 36.4 mL/min (A) (by C-G formula based on SCr of 1.8 mg/dL (H)). Liver Function Tests: Recent Labs  Lab 03/24/20 2239  AST 137*  ALT 93*  ALKPHOS 49  BILITOT 0.8  PROT 6.5  ALBUMIN 3.2*   No results for input(s): LIPASE, AMYLASE in the last 168 hours. No results for input(s): AMMONIA in the last 168 hours. Coagulation Profile: Recent Labs  Lab 03/24/20 2121  INR 1.0   Cardiac Enzymes: No results for input(s): CKTOTAL, CKMB, CKMBINDEX, TROPONINI in the last 168 hours. BNP (last 3 results) No results for input(s): PROBNP in the last 8760 hours. HbA1C: No results for input(s): HGBA1C in the last 72 hours. CBG: No results for input(s): GLUCAP in the last 168 hours. Lipid Profile: No results for input(s): CHOL, HDL, LDLCALC, TRIG, CHOLHDL, LDLDIRECT in the last 72 hours. Thyroid Function Tests: No results for input(s): TSH, T4TOTAL, FREET4, T3FREE, THYROIDAB in the last 72 hours. Anemia Panel: No results for input(s): VITAMINB12, FOLATE, FERRITIN, TIBC, IRON, RETICCTPCT in the last 72 hours. Urine analysis: No results found for: COLORURINE, APPEARANCEUR, LABSPEC, PHURINE, GLUCOSEU, HGBUR, BILIRUBINUR, KETONESUR, PROTEINUR, UROBILINOGEN, NITRITE, LEUKOCYTESUR  Radiological Exams on Admission: CT Angio Chest/Abd/Pel for Dissection W and/or Wo Contrast  Result Date: 03/24/2020 CLINICAL DATA:  Abdominal pain.  Concern for aortic dissection. EXAM: CT ANGIOGRAPHY CHEST, ABDOMEN AND PELVIS TECHNIQUE: Non-contrast CT of the chest was initially obtained. Multidetector CT imaging through the chest, abdomen and pelvis was performed using the standard protocol during bolus administration of intravenous contrast. Multiplanar reconstructed images and MIPs were obtained and reviewed to evaluate the vascular anatomy. CONTRAST:  100mL OMNIPAQUE IOHEXOL 350 MG/ML SOLN  COMPARISON:  None. FINDINGS: CTA CHEST FINDINGS Cardiovascular: There is no evidence for thoracic aortic dissection or aneurysm. The heart size is normal. There is no large pulmonary embolism. Mediastinum/Nodes: -- No mediastinal lymphadenopathy. -- No hilar lymphadenopathy. -- No axillary lymphadenopathy. -- No supraclavicular lymphadenopathy. -- Normal thyroid gland where visualized. -  Unremarkable esophagus. Lungs/Pleura: There are two 4 mm pulmonary nodules in the left lower lobe (axial series 8, image 89). There is no pneumothorax. No large pleural effusion. No focal infiltrate. The trachea is unremarkable. Musculoskeletal: No chest wall abnormality. No bony spinal canal stenosis. Review of the MIP images confirms the above findings. CTA ABDOMEN AND PELVIS FINDINGS VASCULAR Aorta: Normal caliber aorta without aneurysm, dissection, vasculitis or significant stenosis. Celiac: Patent without evidence of aneurysm, dissection, vasculitis or significant stenosis. SMA: Patent without evidence of aneurysm, dissection, vasculitis or significant stenosis. Renals: Both renal arteries are patent without evidence of aneurysm, dissection, vasculitis, fibromuscular dysplasia or significant stenosis. IMA: Patent without evidence of aneurysm, dissection, vasculitis or significant stenosis. Inflow: Patent without evidence of aneurysm, dissection, vasculitis or significant stenosis. Veins: No obvious venous abnormality  within the limitations of this arterial phase study. Review of the MIP images confirms the above findings. NON-VASCULAR Hepatobiliary: The liver is normal. Normal gallbladder.There is no biliary ductal dilation. Pancreas: There is a small hyperattenuating nodule in the pancreatic head measuring approximately 7 mm (series 6, image 156). Spleen: Unremarkable. Adrenals/Urinary Tract: --Adrenal glands: Unremarkable. --Right kidney/ureter: No hydronephrosis or radiopaque kidney stones. --Left kidney/ureter: No  hydronephrosis or radiopaque kidney stones. --Urinary bladder: Unremarkable. Stomach/Bowel: --Stomach/Duodenum: No hiatal hernia or other gastric abnormality. Normal duodenal course and caliber. --Small bowel: Unremarkable. --Colon: There is diffuse circumferential wall thickening the sigmoid colon. There is scattered colonic diverticula. There is liquid stool throughout the. --Appendix: Normal. Lymphatic: --No retroperitoneal lymphadenopathy. --No mesenteric lymphadenopathy. --No pelvic or inguinal lymphadenopathy. Reproductive: The prostate gland is enlarged. Other: No ascites or free air. The abdominal wall is normal. Musculoskeletal. No acute displaced fractures. Review of the MIP images confirms the above findings. IMPRESSION: 1. No evidence for aortic dissection or aneurysm. 2. Diffuse circumferential wall thickening the sigmoid colon, consistent with infectious or inflammatory colitis. 3. There is a small hyperattenuating nodule in the pancreatic head measuring approximately 7 mm. Further evaluation with a contrast-enhanced outpatient MRI is recommended as this could represent a neuroendocrine tumor. 4. Enlarged prostate gland. 5. There is a 4 mm pulmonary nodule in the left lower lobe. No follow-up needed if patient is low-risk. Non-contrast chest CT can be considered in 12 months if patient is high-risk. This recommendation follows the consensus statement: Guidelines for Management of Incidental Pulmonary Nodules Detected on CT Images: From the Fleischner Society 2017; Radiology 2017; 284:228-243. Aortic Atherosclerosis (ICD10-I70.0). Electronically Signed   By: Katherine Mantle M.D.   On: 03/24/2020 22:00    EKG: Independently reviewed.  Assessment/Plan Principal Problem:   Severe sepsis with septic shock (HCC) Active Problems:   Mass of pancreas   AKI (acute kidney injury) (HCC)   Elevated troponin   Pulmonary nodule    1. Severe sepsis with septic shock - 1. Sepsis pathway 2. IVF: 2L  bolus completed -> tachycardia resolved, still soft BPs -> 3rd bolus started now, will then start LR at 150. 3. Empiric cefepime / flagyl / vanc 4. Right now source seems to be colitis 1. GI pathogen pnl pending 2. C diff ordered by EDP and pending 5. Serial lactates 2. AKI - 1. Due to #1 above 2. Strict intake and output 3. IVF as above 4. Hold lisinopril 5. Repeat BMP in AM 3. Mild transaminitis - 1. Repeat CMP in AM 2. No liver or gallbladder pathology mentioned on CT today... 4. Troponin elevation - 1. Demand ischemia due to #1 2. Serial trops 3. Tele monitor 5. Pancreatic nodule - 1. Needs f/u MRI after acute issues managed 6. Pulmonary nodule - 1. Consider f/u CT in 1 yr 7. Hypokalemia - 1. Replace K  DVT prophylaxis: Lovenox Code Status: Full Family Communication: Wife at bedside Disposition Plan: Home after sepsis resolved Consults called: None Admission status: Admit to inpatient  Severity of Illness: The appropriate patient status for this patient is INPATIENT. Inpatient status is judged to be reasonable and necessary in order to provide the required intensity of service to ensure the patient's safety. The patient's presenting symptoms, physical exam findings, and initial radiographic and laboratory data in the context of their chronic comorbidities is felt to place them at high risk for further clinical deterioration. Furthermore, it is not anticipated that the patient will be medically stable for discharge from the hospital within  2 midnights of admission. The following factors support the patient status of inpatient.   IP status due to sepsis with hypotension, AKI, and lactate of 5.6.  * I certify that at the point of admission it is my clinical judgment that the patient will require inpatient hospital care spanning beyond 2 midnights from the point of admission due to high intensity of service, high risk for further deterioration and high frequency of surveillance  required.*    Tillman Kazmierski M. DO [facility]  How to contact the [facility] Attending or Consulting provider 7A - 7P or covering provider during after hours 7P -7A, for this patient?  1. Check the care team in [facility] and look for a) attending/consulting [facility] provider listed and b) the [facility] team listed 2. Log into [email or web address]  Am[employer] Physician Scheduling and messaging for groups and whole hospitals  On call and physician scheduling software for group practices, residents, hospitalists and other medical providers for call, clinic, rotation and shift schedules. On[employer] is a hospital-wide system for scheduling doctors and paging doctors on call. EasyPlot is for scientific plotting and data analysis.  [email or web address]  and use [place]'s universal password to access. If you do not have the password, please contact the hospital operator.  3. Locate the [facility] provider you are looking for under [facility] and page to a number that you can be directly reached. 4. If you still have difficulty reaching the provider, please page the [facility] (Director on Call) for the Hospitalists listed on amion for assistance.  03/25/2020, 12:27 AM

## 2020-03-26 ENCOUNTER — Inpatient Hospital Stay (HOSPITAL_COMMUNITY): Payer: Self-pay

## 2020-03-26 DIAGNOSIS — R778 Other specified abnormalities of plasma proteins: Secondary | ICD-10-CM

## 2020-03-26 LAB — CBC
HCT: 35.5 % — ABNORMAL LOW (ref 39.0–52.0)
Hemoglobin: 11.9 g/dL — ABNORMAL LOW (ref 13.0–17.0)
MCH: 32.2 pg (ref 26.0–34.0)
MCHC: 33.5 g/dL (ref 30.0–36.0)
MCV: 95.9 fL (ref 80.0–100.0)
Platelets: 163 10*3/uL (ref 150–400)
RBC: 3.7 MIL/uL — ABNORMAL LOW (ref 4.22–5.81)
RDW: 13.4 % (ref 11.5–15.5)
WBC: 6.7 10*3/uL (ref 4.0–10.5)
nRBC: 0 % (ref 0.0–0.2)

## 2020-03-26 LAB — BLOOD CULTURE ID PANEL (REFLEXED) - BCID2
A.calcoaceticus-baumannii: NOT DETECTED
Bacteroides fragilis: NOT DETECTED
CTX-M ESBL: NOT DETECTED
Candida albicans: NOT DETECTED
Candida auris: NOT DETECTED
Candida glabrata: NOT DETECTED
Candida krusei: NOT DETECTED
Candida parapsilosis: NOT DETECTED
Candida tropicalis: NOT DETECTED
Carbapenem resist OXA 48 LIKE: NOT DETECTED
Carbapenem resistance IMP: NOT DETECTED
Carbapenem resistance KPC: NOT DETECTED
Carbapenem resistance NDM: NOT DETECTED
Carbapenem resistance VIM: NOT DETECTED
Cryptococcus neoformans/gattii: NOT DETECTED
Enterobacter cloacae complex: NOT DETECTED
Enterobacterales: DETECTED — AB
Enterococcus Faecium: NOT DETECTED
Enterococcus faecalis: NOT DETECTED
Escherichia coli: DETECTED — AB
Haemophilus influenzae: NOT DETECTED
Klebsiella aerogenes: NOT DETECTED
Klebsiella oxytoca: NOT DETECTED
Klebsiella pneumoniae: NOT DETECTED
Listeria monocytogenes: NOT DETECTED
Neisseria meningitidis: NOT DETECTED
Proteus species: NOT DETECTED
Pseudomonas aeruginosa: DETECTED — AB
Salmonella species: NOT DETECTED
Serratia marcescens: NOT DETECTED
Staphylococcus aureus (BCID): NOT DETECTED
Staphylococcus epidermidis: NOT DETECTED
Staphylococcus lugdunensis: NOT DETECTED
Staphylococcus species: NOT DETECTED
Stenotrophomonas maltophilia: NOT DETECTED
Streptococcus agalactiae: NOT DETECTED
Streptococcus pneumoniae: NOT DETECTED
Streptococcus pyogenes: NOT DETECTED
Streptococcus species: NOT DETECTED

## 2020-03-26 LAB — HEPATITIS PANEL, ACUTE
HCV Ab: NONREACTIVE
Hep A IgM: NONREACTIVE
Hep B C IgM: NONREACTIVE
Hepatitis B Surface Ag: NONREACTIVE

## 2020-03-26 LAB — URINE CULTURE: Culture: NO GROWTH

## 2020-03-26 LAB — COMPREHENSIVE METABOLIC PANEL
ALT: 348 U/L — ABNORMAL HIGH (ref 0–44)
AST: 311 U/L — ABNORMAL HIGH (ref 15–41)
Albumin: 2.9 g/dL — ABNORMAL LOW (ref 3.5–5.0)
Alkaline Phosphatase: 41 U/L (ref 38–126)
Anion gap: 10 (ref 5–15)
BUN: 16 mg/dL (ref 6–20)
CO2: 19 mmol/L — ABNORMAL LOW (ref 22–32)
Calcium: 8.4 mg/dL — ABNORMAL LOW (ref 8.9–10.3)
Chloride: 105 mmol/L (ref 98–111)
Creatinine, Ser: 1.08 mg/dL (ref 0.61–1.24)
GFR, Estimated: >60 mL/min (ref >60)
Glucose, Bld: 81 mg/dL (ref 70–99)
Potassium: 3.8 mmol/L (ref 3.5–5.1)
Sodium: 134 mmol/L — ABNORMAL LOW (ref 135–145)
Total Bilirubin: 0.8 mg/dL (ref 0.3–1.2)
Total Protein: 6.1 g/dL — ABNORMAL LOW (ref 6.5–8.1)

## 2020-03-26 LAB — HEMOGLOBIN A1C
Hgb A1c MFr Bld: 5.7 % — ABNORMAL HIGH (ref 4.8–5.6)
Mean Plasma Glucose: 116.89 mg/dL

## 2020-03-26 LAB — MAGNESIUM: Magnesium: 1.8 mg/dL (ref 1.7–2.4)

## 2020-03-26 LAB — PROCALCITONIN: Procalcitonin: 26.99 ng/mL

## 2020-03-26 MED ORDER — SODIUM CHLORIDE 0.9 % IV SOLN
2.0000 g | Freq: Three times a day (TID) | INTRAVENOUS | Status: DC
  Administered 2020-03-26 – 2020-03-29 (×8): 2 g via INTRAVENOUS
  Filled 2020-03-26 (×9): qty 2

## 2020-03-26 MED ORDER — LORAZEPAM 2 MG/ML IJ SOLN: 0.5000 mg | Freq: Once | INTRAMUSCULAR | Status: DC | PRN

## 2020-03-26 MED ORDER — CIPROFLOXACIN HCL 500 MG PO TABS: 500.0000 mg | ORAL_TABLET | Freq: Two times a day (BID) | ORAL | Status: DC

## 2020-03-26 MED ORDER — GADOBUTROL 1 MMOL/ML IV SOLN
8.0000 mL | Freq: Once | INTRAVENOUS | Status: AC | PRN
  Administered 2020-03-26: 16:00:00 8 mL via INTRAVENOUS

## 2020-03-26 MED ORDER — SODIUM CHLORIDE 0.9 % IV SOLN: 2.0000 g | Freq: Three times a day (TID) | INTRAVENOUS | Status: DC

## 2020-03-26 NOTE — Progress Notes (Signed)
[facility] PROGRESS NOTE   Georgina Snellercy C Sloniker QMV:[id number] DOB: 03/07/1961 DOA: 03/24/2020  PCP: Patient, No Pcp Per  Brief History/Interval Summary: 59 y.o. male with medical history significant of HTN on just lisinopril.  Has not followed up with his primary care provider in many years.  Presented with acute diarrhea feeling poorly.  Noted to be hypotensive in the emergency department.  Was also tachycardic and diaphoretic.  Had a fever.  Was given IV fluid bolus.  Underwent CT scan which raised concern for colitis.  He was hospitalized for further management.  Reason for Visit: Severe sepsis with septic shock  Consultants: None  Procedures: None yet  Antibiotics: Anti-infectives (From admission, onward)   Start     Dose/Rate Route Frequency Ordered Stop   03/26/20 1700  ceFEPIme (MAXIPIME) 2 g in sodium chloride 0.9 % 100 mL IVPB        2 g 200 mL/hr over 30 Minutes Intravenous Every 8 hours 03/26/20 0917     03/26/20 0000  vancomycin (VANCOCIN) IVPB 1000 mg/200 mL premix        1,000 mg 200 mL/hr over 60 Minutes Intravenous Every 24 hours 03/25/20 0017     03/25/20 1000  ceFEPIme (MAXIPIME) 2 g in sodium chloride 0.9 % 100 mL IVPB  Status:  Discontinued        2 g 200 mL/hr over 30 Minutes Intravenous Every 12 hours 03/25/20 0017 03/26/20 0917   03/25/20 0000  ceFEPIme (MAXIPIME) 2 g in sodium chloride 0.9 % 100 mL IVPB        2 g 200 mL/hr over 30 Minutes Intravenous  Once 03/24/20 2359 03/25/20 0118   03/25/20 0000  vancomycin (VANCOCIN) IVPB 1000 mg/200 mL premix        1,000 mg 200 mL/hr over 60 Minutes Intravenous  Once 03/24/20 2359 03/25/20 0217   03/25/20 0000  metroNIDAZOLE (FLAGYL) tablet 500 mg        500 mg Oral Every 8 hours 03/24/20 2359     03/24/20 2330  cefTRIAXone (ROCEPHIN) 1 g in sodium chloride 0.9 % 100 mL IVPB  Status:  Discontinued        1 g 200 mL/hr over 30 Minutes Intravenous  Once 03/24/20 2319 03/25/20 0000      Subjective/Interval  History: Patient mentions that he is feeling better.  Occasional soft stools but not as watery as before.  No abdominal pain.  No nausea vomiting.  Denies any chest pain or shortness of breath.      Assessment/Plan:  Acute colitis with severe sepsis with septic shock No other infectious etiology found.  Elevated lactic acid level at 5.6 and was noted to be hypotensive and tachycardic.  Procalcitonin level noted to be extremely elevated at 52.  Surprisingly patient did not have any leukocytosis however.  Cortisol level 21.4.  C. difficile testing negative.  GI pathogen panel is pending.   Procalcitonin improved to 26.99 today.  Lactic acid level had also improved.   Patient was placed on broad-spectrum antibiotics with vancomycin and cefepime and metronidazole.  Cultures are negative so far.  MRSA PCR is negative.  We will stop vancomycin.  Will change cefepime to ciprofloxacin.    Acute kidney injury/hyponatremia/hypokalemia Creatinine 1.98 at presentation.  Now back to normal.  Continue to monitor urine output.  Sodium level improved.  Potassium is normal today.    Transaminitis Most likely due to sepsis.  Does not have any abdominal tenderness on the right upper quadrant.  CT scan did not raise any concern for any hepatobiliary process.  AST ALT continue to rise.  Abdomen remains benign.  Hepatitis panel is pending.   Elevated troponin Patient denies any chest pain.  Initial troponin was 1266, then 1390.  EKG did not show any acute ischemic changes.  Patient denies any history of heart disease.   Echocardiogram shows normal systolic function.  No wall motion abnormalities noted.  Presentation likely due to demand ischemia as the patient was quite hypotensive and tachycardic at presentation.  Continue aspirin for now.  Check troponin level to make sure that it has been trending down.  Do not anticipate any further inpatient work-up.  May need to refer him to cardiology in the outpatient  setting.  Pancreatic nodule Incidentally noted on CT scan.  Proceed with MRI in the hospital since patient without insurance and does not have a primary care provider.    Pulmonary nodule He denies being a smoker.  May not need further work-up or repeat CT in 1 year could be considered.  Normocytic anemia No evidence of overt bleeding.  Drop in hemoglobin is likely dilutional.  Obesity Estimated body mass index is 30.89 kg/m as calculated from the following:   Height as of this encounter:  (1.6 m).   Weight as of this encounter: 79.1 kg.   DVT Prophylaxis: Lovenox Code Status: Full code Family Communication: Discussed with the patient.  With his sister over the phone with his permission. Disposition Plan: Hopefully return home when improved  Status is: Inpatient  Remains inpatient appropriate because:IV treatments appropriate due to intensity of illness or inability to take PO and Inpatient level of care appropriate due to severity of illness   Dispo: The patient is from: Home              Anticipated d/c is to: Home              Anticipated d/c date is: 2 days              Patient currently is not medically stable to d/c.       Medications:  Scheduled: . aspirin EC  81 mg Oral Daily  . enoxaparin (LOVENOX) injection  40 mg Subcutaneous Q24H  . metroNIDAZOLE  500 mg Oral Q8H   Continuous: . ceFEPime (MAXIPIME) IV    . lactated ringers 100 mL/hr at 03/25/20 1205  . vancomycin 1,000 mg (03/26/20 0606)   ZOX:[id number] **OR** acetaminophen, LORazepam, ondansetron **OR** ondansetron (ZOFRAN) IV   Objective:  Vital Signs  Vitals:   03/25/20 0900 03/25/20 1400 03/25/20 1954 03/26/20 0600  BP: 91/68 105/75 102/72 112/68  Pulse: 60  64 61  Resp: 16  17   Temp: 98.2 F (36.8 C)  98.8 F (37.1 C) 98.5 F (36.9 C)  TempSrc: Oral  Oral Oral  SpO2: 98%  99% 97%  Weight:      Height:        Intake/Output Summary (Last 24 hours) at 03/26/2020 1041 Last  data filed at 03/26/2020 0700 Gross per 24 hour  Intake 2575.58 ml  Output 1100 ml  Net 1475.58 ml   Filed Weights   03/24/20 2137 03/25/20 0400  Weight: 63.5 kg 79.1 kg    General appearance: Awake alert.  In no distress Resp: Clear to auscultation bilaterally.  Normal effort Cardio: S1-S2 is normal regular.  No S3-S4.  No rubs murmurs or bruit GI: Abdomen is soft.  Nontender nondistended.  Bowel sounds are present  normal.  No masses organomegaly Extremities: No edema.  Full range of motion of lower extremities. Neurologic: Alert and oriented x3.  No focal neurological deficits.     Lab Results:  Data Reviewed: I have personally reviewed following labs and imaging studies  CBC: Recent Labs  Lab 03/24/20 2116 03/24/20 2247 03/25/20 0406 03/26/20 0545  WBC 5.6  --  8.6 6.7  NEUTROABS 4.1  --   --   --   HGB 16.2 13.6 13.1 11.9*  HCT 46.4 40.0 37.3* 35.5*  MCV 94.3  --  94.9 95.9  PLT 189  --  156 163    Basic Metabolic Panel: Recent Labs  Lab 03/24/20 2239 03/24/20 2247 03/25/20 0406 03/26/20 0545  NA 132* 133* 131* 134*  K 3.2* 3.3* 3.7 3.8  CL 101 100 102 105  CO2 19*  --  21* 19*  GLUCOSE 113* 106* 120* 81  BUN 21* 22* 19 16  CREATININE 1.98* 1.80* 1.60* 1.08  CALCIUM 8.3*  --  8.6* 8.4*  MG  --   --   --  1.8    GFR: Estimated Creatinine Clearance: 70.2 mL/min (by C-G formula based on SCr of 1.08 mg/dL).  Liver Function Tests: Recent Labs  Lab 03/24/20 2239 03/25/20 0406 03/26/20 0545  AST 137* 272* 311*  ALT 93* 162* 348*  ALKPHOS 49 43 41  BILITOT 0.8 0.4 0.8  PROT 6.5 6.8 6.1*  ALBUMIN 3.2* 3.2* 2.9*     Coagulation Profile: Recent Labs  Lab 03/24/20 2121 03/25/20 0406  INR 1.0 1.2     Recent Results (from the past 240 hour(s))  Blood culture (routine x 2)     Status: None (Preliminary result)   Collection Time: 03/24/20  9:36 PM   Specimen: BLOOD  Result Value Ref Range Status   Specimen Description BLOOD RIGHT  ANTECUBITAL  Final   Special Requests   Final    BOTTLES DRAWN AEROBIC AND ANAEROBIC Blood Culture results may not be optimal due to an excessive volume of blood received in culture bottles   Culture   Final    NO GROWTH < 24 HOURS Performed at [facility], [address], [place], [place] [address]    Report Status PENDING  Incomplete  Blood culture (routine x 2)     Status: None (Preliminary result)   Collection Time: 03/24/20  9:37 PM   Specimen: BLOOD  Result Value Ref Range Status   Specimen Description BLOOD BLOOD LEFT FOREARM  Final   Special Requests   Final    BOTTLES DRAWN AEROBIC AND ANAEROBIC Blood Culture results may not be optimal due to an excessive volume of blood received in culture bottles   Culture   Final    NO GROWTH < 24 HOURS Performed at [facility], [address] [place], [place] [address]    Report Status PENDING  Incomplete  Culture, Urine     Status: None   Collection Time: 03/25/20 12:00 AM   Specimen: Urine, Random  Result Value Ref Range Status   Specimen Description URINE, RANDOM  Final   Special Requests NONE  Final   Culture   Final    NO GROWTH Performed at [facility], [address], [place], [place] [address]    Report Status 03/26/2020 FINAL  Final  Resp Panel by RT-PCR (Flu A&B, Covid) Nasopharyngeal Swab     Status: None   Collection Time: 03/25/20 12:14 AM   Specimen:  Nasopharyngeal Swab; Nasopharyngeal(NP) swabs in vial transport medium  Result Value Ref Range Status   SARS Coronavirus 2 by RT PCR NEGATIVE NEGATIVE Final    Comment: (NOTE) SARS-CoV-2 target nucleic acids are NOT DETECTED.  The SARS-CoV-2 RNA is generally detectable in upper respiratory specimens during the acute phase of infection. The lowest concentration of SARS-CoV-2 viral copies this assay can detect is 138 copies/mL. A negative result does not preclude SARS-Cov-2 infection and should not be used as the sole basis for treatment or other  patient management decisions. A negative result may occur with  improper specimen collection/handling, submission of specimen other than nasopharyngeal swab, presence of viral mutation(s) within the areas targeted by this assay, and inadequate number of viral copies(<138 copies/mL). A negative result must be combined with clinical observations, patient history, and epidemiological information. The expected result is Negative.  Fact Sheet for Patients:  BloggerCourse.com  Fact Sheet for Healthcare Providers:  SeriousBroker.it  This test is no t yet approved or cleared by the [place] FDA and  has been authorized for detection and/or diagnosis of SARS-CoV-2 by FDA under an Emergency Use Authorization (EUA). This EUA will remain  in effect (meaning this test can be used) for the duration of the COVID-19 declaration under Section 564(b)(1) of the Act, 21 U.S.C.section 360bbb-3(b)(1), unless the authorization is terminated  or revoked sooner.       Influenza A by PCR NEGATIVE NEGATIVE Final   Influenza B by PCR NEGATIVE NEGATIVE Final    Comment: (NOTE) The Xpert Xpress SARS-CoV-2/FLU/RSV plus assay is intended as an aid in the diagnosis of influenza from Nasopharyngeal swab specimens and should not be used as a sole basis for treatment. Nasal washings and aspirates are unacceptable for Xpert Xpress SARS-CoV-2/FLU/RSV testing.  Fact Sheet for Patients: BloggerCourse.com  Fact Sheet for Healthcare Providers: SeriousBroker.it  This test is not yet approved or cleared by the [place] FDA and has been authorized for detection and/or diagnosis of SARS-CoV-2 by FDA under an Emergency Use Authorization (EUA). This EUA will remain in effect (meaning this test can be used) for the duration of the COVID-19 declaration under Section 564(b)(1) of the Act, 21 U.S.C. section  360bbb-3(b)(1), unless the authorization is terminated or revoked.  Performed at [facility], [address] [place], [place] [address]   Gastrointestinal Panel by PCR , Stool     Status: None   Collection Time: 03/25/20  1:35 AM   Specimen: STOOL  Result Value Ref Range Status   Campylobacter species NOT DETECTED NOT DETECTED Final   Plesimonas shigelloides NOT DETECTED NOT DETECTED Final   Salmonella species NOT DETECTED NOT DETECTED Final   Yersinia enterocolitica NOT DETECTED NOT DETECTED Final   Vibrio species NOT DETECTED NOT DETECTED Final   Vibrio cholerae NOT DETECTED NOT DETECTED Final   Enteroaggregative E coli (EAEC) NOT DETECTED NOT DETECTED Final   Enteropathogenic E coli (EPEC) NOT DETECTED NOT DETECTED Final   Enterotoxigenic E coli (ETEC) NOT DETECTED NOT DETECTED Final   Shiga like toxin producing E coli (STEC) NOT DETECTED NOT DETECTED Final   Shigella/Enteroinvasive E coli (EIEC) NOT DETECTED NOT DETECTED Final   Cryptosporidium NOT DETECTED NOT DETECTED Final   Cyclospora cayetanensis NOT DETECTED NOT DETECTED Final   Entamoeba histolytica NOT DETECTED NOT DETECTED Final   Giardia lamblia NOT DETECTED NOT DETECTED Final   Adenovirus F40/41 NOT DETECTED NOT DETECTED Final   Astrovirus NOT DETECTED NOT DETECTED Final   Norovirus GI/GII NOT  DETECTED NOT DETECTED Final   Rotavirus A NOT DETECTED NOT DETECTED Final   Sapovirus (I, II, IV, and V) NOT DETECTED NOT DETECTED Final    Comment: Performed at [facility], [address] [place], [place] [address]  C Difficile Quick Screen w PCR reflex     Status: None   Collection Time: 03/25/20  1:35 AM   Specimen: STOOL  Result Value Ref Range Status   C Diff antigen NEGATIVE NEGATIVE Final   C Diff toxin NEGATIVE NEGATIVE Final   C Diff interpretation No C. difficile detected.  Final    Comment: Performed at [facility], [address], [place], [place] [address]  MRSA PCR Screening      Status: None   Collection Time: 03/25/20  5:06 AM   Specimen: Nasal Mucosa; Nasopharyngeal  Result Value Ref Range Status   MRSA by PCR NEGATIVE NEGATIVE Final    Comment:        The GeneXpert MRSA Assay (FDA approved for NASAL specimens only), is one component of a comprehensive MRSA colonization surveillance program. It is not intended to diagnose MRSA infection nor to guide or monitor treatment for MRSA infections. Performed at [facility], [address], [place], [place] [address]       Radiology Studies: ECHOCARDIOGRAM COMPLETE  Result Date: 03/25/2020    ECHOCARDIOGRAM REPORT   Patient Name:   ABDUL BEIRNE Date of Exam: 03/25/2020 Medical Rec #:  [id number]     Height:       63.0 in Accession #:    [id number]    Weight:       174.4 lb Date of Birth:  12/05/1960      BSA:          1.824 m Patient Age:    59 years      BP:           91/68 mmHg Patient Gender: M             HR:           60 bpm. Exam Location:  Inpatient Procedure: 2D Echo, Cardiac Doppler and Color Doppler Indications:    Elevated troponin  History:        Patient has no prior history of Echocardiogram examinations.                 Risk Factors:Hypertension. Sepsis, colitis.  Sonographer:    Lavenia Atlas Referring Phys: [id number] Osvaldo Shipper IMPRESSIONS  1. Left ventricular ejection fraction, by estimation, is 55 to 60%. The left ventricle has normal function. The left ventricle has no regional wall motion abnormalities. Left ventricular diastolic parameters were normal.  2. Right ventricular systolic function is normal. The right ventricular size is mildly enlarged.  3. The mitral valve is normal in structure. Trivial mitral valve regurgitation. No evidence of mitral stenosis.  4. The aortic valve is tricuspid. Aortic valve regurgitation is not visualized. No aortic stenosis is present.  5. The inferior vena cava is normal in size with greater than 50% respiratory variability, suggesting right atrial pressure  of 3 mmHg. FINDINGS  Left Ventricle: Left ventricular ejection fraction, by estimation, is 55 to 60%. The left ventricle has normal function. The left ventricle has no regional wall motion abnormalities. The left ventricular internal cavity size was normal in size. There is  no left ventricular hypertrophy. Left ventricular diastolic parameters were normal. Right Ventricle: The right ventricular size is mildly enlarged. No increase  in right ventricular wall thickness. Right ventricular systolic function is normal. Left Atrium: Left atrial size was normal in size. Right Atrium: Right atrial size was normal in size. Pericardium: There is no evidence of pericardial effusion. Mitral Valve: The mitral valve is normal in structure. Trivial mitral valve regurgitation. No evidence of mitral valve stenosis. Tricuspid Valve: The tricuspid valve is normal in structure. Tricuspid valve regurgitation is not demonstrated. No evidence of tricuspid stenosis. Aortic Valve: The aortic valve is tricuspid. Aortic valve regurgitation is not visualized. No aortic stenosis is present. Pulmonic Valve: The pulmonic valve was normal in structure. Pulmonic valve regurgitation is not visualized. No evidence of pulmonic stenosis. Aorta: The aortic root is normal in size and structure. Venous: The inferior vena cava is normal in size with greater than 50% respiratory variability, suggesting right atrial pressure of 3 mmHg. IAS/Shunts: No atrial level shunt detected by color flow Doppler.  LEFT VENTRICLE PLAX 2D LVIDd:         5.10 cm  Diastology LVIDs:         3.00 cm  LV e' medial:    9.46 cm/s LV PW:         1.10 cm  LV E/e' medial:  5.7 LV IVS:        1.00 cm  LV e' lateral:   12.30 cm/s LVOT diam:     2.00 cm  LV E/e' lateral: 4.4 LV SV:         51 LV SV Index:   28 LVOT Area:     3.14 cm  RIGHT VENTRICLE RV Basal diam:  3.30 cm RV S prime:     5.87 cm/s TAPSE (M-mode): 2.5 cm LEFT ATRIUM             Index       RIGHT ATRIUM           Index  LA diam:        3.40 cm 1.86 cm/m  RA Area:     12.40 cm LA Vol (A2C):   47.0 ml 25.76 ml/m RA Volume:   30.50 ml  16.72 ml/m LA Vol (A4C):   33.8 ml 18.53 ml/m LA Biplane Vol: 43.9 ml 24.06 ml/m  AORTIC VALVE LVOT Vmax:   75.70 cm/s LVOT Vmean:  53.200 cm/s LVOT VTI:    0.161 m  AORTA Ao Root diam: 2.80 cm MITRAL VALVE MV Area (PHT): 3.17 cm    SHUNTS MV Decel Time: 239 msec    Systemic VTI:  0.16 m MV E velocity: 53.60 cm/s  Systemic Diam: 2.00 cm MV A velocity: 39.40 cm/s MV E/A ratio:  1.36 Charlton Haws MD Electronically signed by Charlton Haws MD Signature Date/Time: 03/25/2020/2:37:29 PM    Final    CT Angio Chest/Abd/Pel for Dissection W and/or Wo Contrast  Result Date: 03/24/2020 CLINICAL DATA:  Abdominal pain.  Concern for aortic dissection. EXAM: CT ANGIOGRAPHY CHEST, ABDOMEN AND PELVIS TECHNIQUE: Non-contrast CT of the chest was initially obtained. Multidetector CT imaging through the chest, abdomen and pelvis was performed using the standard protocol during bolus administration of intravenous contrast. Multiplanar reconstructed images and MIPs were obtained and reviewed to evaluate the vascular anatomy. CONTRAST:  OMNIPAQUE IOHEXOL 350 MG/ML SOLN COMPARISON:  None. FINDINGS: CTA CHEST FINDINGS Cardiovascular: There is no evidence for thoracic aortic dissection or aneurysm. The heart size is normal. There is no large pulmonary embolism. Mediastinum/Nodes: -- No mediastinal lymphadenopathy. -- No hilar lymphadenopathy. -- No axillary lymphadenopathy. -- No  supraclavicular lymphadenopathy. -- Normal thyroid gland where visualized. -  Unremarkable esophagus. Lungs/Pleura: There are two 4 mm pulmonary nodules in the left lower lobe (axial series 8, image 89). There is no pneumothorax. No large pleural effusion. No focal infiltrate. The trachea is unremarkable. Musculoskeletal: No chest wall abnormality. No bony spinal canal stenosis. Review of the MIP images confirms the above findings. CTA  ABDOMEN AND PELVIS FINDINGS VASCULAR Aorta: Normal caliber aorta without aneurysm, dissection, vasculitis or significant stenosis. Celiac: Patent without evidence of aneurysm, dissection, vasculitis or significant stenosis. SMA: Patent without evidence of aneurysm, dissection, vasculitis or significant stenosis. Renals: Both renal arteries are patent without evidence of aneurysm, dissection, vasculitis, fibromuscular dysplasia or significant stenosis. IMA: Patent without evidence of aneurysm, dissection, vasculitis or significant stenosis. Inflow: Patent without evidence of aneurysm, dissection, vasculitis or significant stenosis. Veins: No obvious venous abnormality within the limitations of this arterial phase study. Review of the MIP images confirms the above findings. NON-VASCULAR Hepatobiliary: The liver is normal. Normal gallbladder.There is no biliary ductal dilation. Pancreas: There is a small hyperattenuating nodule in the pancreatic head measuring approximately 7 mm (series 6, image 156). Spleen: Unremarkable. Adrenals/Urinary Tract: --Adrenal glands: Unremarkable. --Right kidney/ureter: No hydronephrosis or radiopaque kidney stones. --Left kidney/ureter: No hydronephrosis or radiopaque kidney stones. --Urinary bladder: Unremarkable. Stomach/Bowel: --Stomach/Duodenum: No hiatal hernia or other gastric abnormality. Normal duodenal course and caliber. --Small bowel: Unremarkable. --Colon: There is diffuse circumferential wall thickening the sigmoid colon. There is scattered colonic diverticula. There is liquid stool throughout the. --Appendix: Normal. Lymphatic: --No retroperitoneal lymphadenopathy. --No mesenteric lymphadenopathy. --No pelvic or inguinal lymphadenopathy. Reproductive: The prostate gland is enlarged. Other: No ascites or free air. The abdominal wall is normal. Musculoskeletal. No acute displaced fractures. Review of the MIP images confirms the above findings. IMPRESSION: 1. No evidence for  aortic dissection or aneurysm. 2. Diffuse circumferential wall thickening the sigmoid colon, consistent with infectious or inflammatory colitis. 3. There is a small hyperattenuating nodule in the pancreatic head measuring approximately 7 mm. Further evaluation with a contrast-enhanced outpatient MRI is recommended as this could represent a neuroendocrine tumor. 4. Enlarged prostate gland. 5. There is a 4 mm pulmonary nodule in the left lower lobe. No follow-up needed if patient is low-risk. Non-contrast chest CT can be considered in 12 months if patient is high-risk. This recommendation follows the consensus statement: Guidelines for Management of Incidental Pulmonary Nodules Detected on CT Images: From the Fleischner Society 2017; Radiology 2017; 284:228-243. Aortic Atherosclerosis (ICD10-I70.0). Electronically Signed   By: Katherine Mantle M.D.   On: 03/24/2020 22:00       LOS: 1 day   Corydon Schweiss [employer] on [email or web address]  03/26/2020, 10:41 AM

## 2020-03-26 NOTE — Progress Notes (Signed)
PHARMACY - PHYSICIAN COMMUNICATION CRITICAL VALUE ALERT - BLOOD CULTURE IDENTIFICATION (BCID)  Perry Zamora is an 59 y.o. male who presented to [facility] on 03/24/2020 with severe sepsis from colitis  Assessment: blood cultures show GNR with BCID showing ecoli and pseudomonas.   Name of physician (or Provider) Contacted: Dr. Cindie Laroche  Current antibiotics: cipro and flagyl  Changes to prescribed antibiotics recommended:  -Change to cefepime and follow sensitivities -Continue flagyl for now per MD  Results for orders placed or performed during the hospital encounter of 03/24/20  Blood Culture ID Panel (Reflexed) (Collected: 03/24/2020  9:37 PM)  Result Value Ref Range   Enterococcus faecalis NOT DETECTED NOT DETECTED   Enterococcus Faecium NOT DETECTED NOT DETECTED   Listeria monocytogenes NOT DETECTED NOT DETECTED   Staphylococcus species NOT DETECTED NOT DETECTED   Staphylococcus aureus (BCID) NOT DETECTED NOT DETECTED   Staphylococcus epidermidis NOT DETECTED NOT DETECTED   Staphylococcus lugdunensis NOT DETECTED NOT DETECTED   Streptococcus species NOT DETECTED NOT DETECTED   Streptococcus agalactiae NOT DETECTED NOT DETECTED   Streptococcus pneumoniae NOT DETECTED NOT DETECTED   Streptococcus pyogenes NOT DETECTED NOT DETECTED   A.calcoaceticus-baumannii NOT DETECTED NOT DETECTED   Bacteroides fragilis NOT DETECTED NOT DETECTED   Enterobacterales DETECTED (A) NOT DETECTED   Enterobacter cloacae complex NOT DETECTED NOT DETECTED   Escherichia coli DETECTED (A) NOT DETECTED   Klebsiella aerogenes NOT DETECTED NOT DETECTED   Klebsiella oxytoca NOT DETECTED NOT DETECTED   Klebsiella pneumoniae NOT DETECTED NOT DETECTED   Proteus species NOT DETECTED NOT DETECTED   Salmonella species NOT DETECTED NOT DETECTED   Serratia marcescens NOT DETECTED NOT DETECTED   Haemophilus influenzae NOT DETECTED NOT DETECTED   Neisseria meningitidis NOT DETECTED NOT DETECTED   Pseudomonas  aeruginosa DETECTED (A) NOT DETECTED   Stenotrophomonas maltophilia NOT DETECTED NOT DETECTED   Candida albicans NOT DETECTED NOT DETECTED   Candida auris NOT DETECTED NOT DETECTED   Candida glabrata NOT DETECTED NOT DETECTED   Candida krusei NOT DETECTED NOT DETECTED   Candida parapsilosis NOT DETECTED NOT DETECTED   Candida tropicalis NOT DETECTED NOT DETECTED   Cryptococcus neoformans/gattii NOT DETECTED NOT DETECTED   CTX-M ESBL NOT DETECTED NOT DETECTED   Carbapenem resistance IMP NOT DETECTED NOT DETECTED   Carbapenem resistance KPC NOT DETECTED NOT DETECTED   Carbapenem resistance NDM NOT DETECTED NOT DETECTED   Carbapenem resist OXA 48 LIKE NOT DETECTED NOT DETECTED   Carbapenem resistance VIM NOT DETECTED NOT DETECTED   Harland German, PharmD Clinical Pharmacist **Pharmacist phone directory can now be found on [email or web address] ([email or web address]).  Listed under [facility].

## 2020-03-27 LAB — CBC
HCT: 35.4 % — ABNORMAL LOW (ref 39.0–52.0)
Hemoglobin: 12.3 g/dL — ABNORMAL LOW (ref 13.0–17.0)
MCH: 32.8 pg (ref 26.0–34.0)
MCHC: 34.7 g/dL (ref 30.0–36.0)
MCV: 94.4 fL (ref 80.0–100.0)
Platelets: 192 10*3/uL (ref 150–400)
RBC: 3.75 MIL/uL — ABNORMAL LOW (ref 4.22–5.81)
RDW: 13.2 % (ref 11.5–15.5)
WBC: 5 10*3/uL (ref 4.0–10.5)
nRBC: 0 % (ref 0.0–0.2)

## 2020-03-27 LAB — COMPREHENSIVE METABOLIC PANEL
ALT: 228 U/L — ABNORMAL HIGH (ref 0–44)
AST: 129 U/L — ABNORMAL HIGH (ref 15–41)
Albumin: 3 g/dL — ABNORMAL LOW (ref 3.5–5.0)
Alkaline Phosphatase: 56 U/L (ref 38–126)
Anion gap: 10 (ref 5–15)
BUN: 13 mg/dL (ref 6–20)
CO2: 23 mmol/L (ref 22–32)
Calcium: 8.8 mg/dL — ABNORMAL LOW (ref 8.9–10.3)
Chloride: 104 mmol/L (ref 98–111)
Creatinine, Ser: 0.93 mg/dL (ref 0.61–1.24)
GFR, Estimated: >60 mL/min (ref >60)
Glucose, Bld: 108 mg/dL — ABNORMAL HIGH (ref 70–99)
Potassium: 3.5 mmol/L (ref 3.5–5.1)
Sodium: 137 mmol/L (ref 135–145)
Total Bilirubin: 0.7 mg/dL (ref 0.3–1.2)
Total Protein: 6.2 g/dL — ABNORMAL LOW (ref 6.5–8.1)

## 2020-03-27 LAB — TROPONIN I (HIGH SENSITIVITY): Troponin I (High Sensitivity): 160 ng/L (ref <18)

## 2020-03-27 LAB — PROCALCITONIN: Procalcitonin: 9.3 ng/mL

## 2020-03-27 MED ORDER — POTASSIUM CHLORIDE CRYS ER 20 MEQ PO TBCR
40.0000 meq | EXTENDED_RELEASE_TABLET | Freq: Once | ORAL | Status: AC
  Administered 2020-03-27: 40 meq via ORAL

## 2020-03-27 NOTE — Progress Notes (Signed)
[facility] PROGRESS NOTE   Perry Zamora JJO:[id number] DOB: Aug 14, 1960 DOA: 03/24/2020  PCP: Patient, No Pcp Per  Brief History/Interval Summary: 59 y.o. male with medical history significant of HTN on just lisinopril.  Has not followed up with his primary care provider in many years.  Presented with acute diarrhea feeling poorly.  Noted to be hypotensive in the emergency department.  Was also tachycardic and diaphoretic.  Had a fever.  Was given IV fluid bolus.  Underwent CT scan which raised concern for colitis.  He was hospitalized for further management.  Reason for Visit: Severe sepsis with septic shock.  Gram-negative bacteremia  Consultants: None  Procedures: None yet  Antibiotics: Anti-infectives (From admission, onward)   Start     Dose/Rate Route Frequency Ordered Stop   03/26/20 2000  ciprofloxacin (CIPRO) tablet 500 mg  Status:  Discontinued        500 mg Oral 2 times daily 03/26/20 1048 03/26/20 1803   03/26/20 1900  ceFEPIme (MAXIPIME) 2 g in sodium chloride 0.9 % 100 mL IVPB        2 g 200 mL/hr over 30 Minutes Intravenous Every 8 hours 03/26/20 1803     03/26/20 1700  ceFEPIme (MAXIPIME) 2 g in sodium chloride 0.9 % 100 mL IVPB  Status:  Discontinued        2 g 200 mL/hr over 30 Minutes Intravenous Every 8 hours 03/26/20 0917 03/26/20 1048   03/26/20 0000  vancomycin (VANCOCIN) IVPB 1000 mg/200 mL premix  Status:  Discontinued        1,000 mg 200 mL/hr over 60 Minutes Intravenous Every 24 hours 03/25/20 0017 03/26/20 1048   03/25/20 1000  ceFEPIme (MAXIPIME) 2 g in sodium chloride 0.9 % 100 mL IVPB  Status:  Discontinued        2 g 200 mL/hr over 30 Minutes Intravenous Every 12 hours 03/25/20 0017 03/26/20 0917   03/25/20 0000  ceFEPIme (MAXIPIME) 2 g in sodium chloride 0.9 % 100 mL IVPB        2 g 200 mL/hr over 30 Minutes Intravenous  Once 03/24/20 2359 03/25/20 0118   03/25/20 0000  vancomycin (VANCOCIN) IVPB 1000 mg/200 mL premix        1,000 mg 200  mL/hr over 60 Minutes Intravenous  Once 03/24/20 2359 03/25/20 0217   03/25/20 0000  metroNIDAZOLE (FLAGYL) tablet 500 mg        500 mg Oral Every 8 hours 03/24/20 2359     03/24/20 2330  cefTRIAXone (ROCEPHIN) 1 g in sodium chloride 0.9 % 100 mL IVPB  Status:  Discontinued        1 g 200 mL/hr over 30 Minutes Intravenous  Once 03/24/20 2319 03/25/20 0000      Subjective/Interval History: Patient mentions that he is feeling well.  Denies any complaints.  Diarrhea appears to have resolved.  No abdominal pain.  No nausea vomiting.  Denies any shortness of breath.      Assessment/Plan:  Acute colitis with severe sepsis with septic shock/gram-negative bacteremia Patient found to have evidence for colitis on CT scan.  Elevated lactic acid level at 5.6 and was noted to be hypotensive and tachycardic.  Procalcitonin level noted to be extremely elevated at 52.  Surprisingly patient did not have any leukocytosis however.  Cortisol level 21.4.  C. difficile testing negative.  GI pathogen panel is pending.   She was aggressively hydrated.  Improvement in lactic acid level noted.  Procalcitonin improved to  26.99 from 52.  Patient was initially placed on broad-spectrum antibiotics with vancomycin cefepime and metronidazole.  MRSA PCR was negative.  Vancomycin was discontinued.  Cefepime was changed to ciprofloxacin. However late yesterday evening his cultures started growing bacteria.  Noted to have E. coli and Pseudomonas.  Waiting on final reports.  Ciprofloxacin was changed back to cefepime.  Continue Flagyl for total of 5 days.  Reports discussed with patient and his sister.  He was told that he will need to stay in the hospital for now.  Acute kidney injury/hyponatremia/hypokalemia Creatinine 1.98 at presentation.  Resolved with IV hydration.  Sodium is now normal.  Give additional dose of potassium today.  Magnesium was 1.8 yesterday.  Transaminitis Most likely due to sepsis.  Abdomen remains  benign.  Hepatitis panel as noted to be unremarkable.  Liver function tests are improving.  Continue to monitor.    Elevated troponin Patient denied any chest pain.  Initial troponin was 1266, then 1390.  EKG did not show any acute ischemic changes.  Patient denies any history of heart disease.   Echocardiogram shows normal systolic function.  No wall motion abnormalities noted.  Presentation likely due to demand ischemia as the patient was quite hypotensive and tachycardic at presentation.   Patient started on aspirin.  His troponin level is noted to be 160 this morning.  Do not anticipate any further inpatient work-up.  He will need to be referred to cardiology in the outpatient setting.    Pancreatic nodule Incidentally noted on CT scan.  MRI was done which does not show any pancreatic lesions.  Communicated to the patient.     Pulmonary nodule He denies being a smoker.  May not need further work-up or repeat CT in 1 year could be considered.  Normocytic anemia No evidence of overt bleeding.  Drop in hemoglobin is likely dilutional.  Hemoglobin remained stable.  Obesity Estimated body mass index is 30.89 kg/m as calculated from the following:   Height as of this encounter:  (1.6 m).   Weight as of this encounter: 79.1 kg.   DVT Prophylaxis: Lovenox Code Status: Full code Family Communication: Discussed with the patient.  Sister updated as well.   Disposition Plan: Hopefully return home when improved  Status is: Inpatient  Remains inpatient appropriate because:IV treatments appropriate due to intensity of illness or inability to take PO and Inpatient level of care appropriate due to severity of illness   Dispo: The patient is from: Home              Anticipated d/c is to: Home              Anticipated d/c date is: 2 days              Patient currently is not medically stable to d/c.       Medications:  Scheduled: . aspirin EC  81 mg Oral Daily  . enoxaparin  (LOVENOX) injection  40 mg Subcutaneous Q24H  . metroNIDAZOLE  500 mg Oral Q8H   Continuous: . ceFEPime (MAXIPIME) IV 2 g (03/27/20 0519)  . lactated ringers 50 mL/hr at 03/26/20 1146   ZOX:[id number] **OR** acetaminophen, LORazepam, ondansetron **OR** ondansetron (ZOFRAN) IV   Objective:  Vital Signs  Vitals:   03/26/20 0600 03/26/20 1301 03/26/20 1909 03/27/20 0517  BP: 112/68 (!) 138/92 (!) 132/96 (!) 143/102  Pulse: 61 64 69 61  Resp:  Temp: 98.5 F (36.9 C) 98.7 F (37.1  C) 99.1 F (37.3 C) 98.7 F (37.1 C)  TempSrc: Oral Oral Oral Oral  SpO2: 97% 100% 100% 98%  Weight:      Height:        Intake/Output Summary (Last 24 hours) at 03/27/2020 0937 Last data filed at 03/27/2020 0600 Gross per 24 hour  Intake 1775.75 ml  Output 1750 ml  Net 25.75 ml   Filed Weights   03/24/20 2137 03/25/20 0400  Weight: 63.5 kg 79.1 kg    General appearance: Awake alert.  In no distress Resp: Clear to auscultation bilaterally.  Normal effort Cardio: S1-S2 is normal regular.  No S3-S4.  No rubs murmurs or bruit GI: Abdomen is soft.  Nontender nondistended.  Bowel sounds are present normal.  No masses organomegaly Extremities: No edema.  Full range of motion of lower extremities. Neurologic: Alert and oriented x3.  No focal neurological deficits.      Lab Results:  Data Reviewed: I have personally reviewed following labs and imaging studies  CBC: Recent Labs  Lab 03/24/20 2116 03/24/20 2247 03/25/20 0406 03/26/20 0545 03/27/20 0628  WBC 5.6  --  8.6 6.7 5.0  NEUTROABS 4.1  --   --   --   --   HGB 16.2 13.6 13.1 11.9* 12.3*  HCT 46.4 40.0 37.3* 35.5* 35.4*  MCV 94.3  --  94.9 95.9 94.4  PLT 189  --  156 163 192    Basic Metabolic Panel: Recent Labs  Lab 03/24/20 2239 03/24/20 2247 03/25/20 0406 03/26/20 0545 03/27/20 0628  NA 132* 133* 131* 134* 137  K 3.2* 3.3* 3.7 3.8 3.5  CL 101 100 102 105 104  CO2 19*  --  21* 19* 23  GLUCOSE 113*  106* 120* 81 108*  BUN 21* 22* 19 16 13   CREATININE 1.98* 1.80* 1.60* 1.08 0.93  CALCIUM 8.3*  --  8.6* 8.4* 8.8*  MG  --   --   --  1.8  --     GFR: Estimated Creatinine Clearance: 81.6 mL/min (by C-G formula based on SCr of 0.93 mg/dL).  Liver Function Tests: Recent Labs  Lab 03/24/20 2239 03/25/20 0406 03/26/20 0545 03/27/20 0628  AST 137* 272* 311* 129*  ALT 93* 162* 348* 228*  ALKPHOS 49 43 41 56  BILITOT 0.8 0.4 0.8 0.7  PROT 6.5 6.8 6.1* 6.2*  ALBUMIN 3.2* 3.2* 2.9* 3.0*     Coagulation Profile: Recent Labs  Lab 03/24/20 2121 03/25/20 0406  INR 1.0 1.2     Recent Results (from the past 240 hour(s))  Blood culture (routine x 2)     Status: None (Preliminary result)   Collection Time: 03/24/20  9:36 PM   Specimen: BLOOD  Result Value Ref Range Status   Specimen Description BLOOD RIGHT ANTECUBITAL  Final   Special Requests   Final    BOTTLES DRAWN AEROBIC AND ANAEROBIC Blood Culture results may not be optimal due to an excessive volume of blood received in culture bottles   Culture   Final    NO GROWTH 2 DAYS Performed at [facility], [address], [place], [place]    Report Status PENDING  Incomplete  Blood culture (routine x 2)     Status: None (Preliminary result)   Collection Time: 03/24/20  9:37 PM   Specimen: BLOOD  Result Value Ref Range Status   Specimen Description BLOOD BLOOD LEFT FOREARM  Final   Special Requests   Final    BOTTLES  DRAWN AEROBIC AND ANAEROBIC Blood Culture results may not be optimal due to an excessive volume of blood received in culture bottles   Culture  Setup Time   Final    GRAM NEGATIVE RODS ANAEROBIC BOTTLE ONLY Organism ID to follow CRITICAL RESULT CALLED TO, READ BACK BY AND VERIFIED WITH: PHARMD A MEYER [id number] Performed at [facility], [address] [place], [place] [address]    Culture GRAM NEGATIVE RODS  Final   Report Status PENDING  Incomplete  Blood Culture ID Panel  (Reflexed)     Status: Abnormal   Collection Time: 03/24/20  9:37 PM  Result Value Ref Range Status   Enterococcus faecalis NOT DETECTED NOT DETECTED Final   Enterococcus Faecium NOT DETECTED NOT DETECTED Final   Listeria monocytogenes NOT DETECTED NOT DETECTED Final   Staphylococcus species NOT DETECTED NOT DETECTED Final   Staphylococcus aureus (BCID) NOT DETECTED NOT DETECTED Final   Staphylococcus epidermidis NOT DETECTED NOT DETECTED Final   Staphylococcus lugdunensis NOT DETECTED NOT DETECTED Final   Streptococcus species NOT DETECTED NOT DETECTED Final   Streptococcus agalactiae NOT DETECTED NOT DETECTED Final   Streptococcus pneumoniae NOT DETECTED NOT DETECTED Final   Streptococcus pyogenes NOT DETECTED NOT DETECTED Final   A.calcoaceticus-baumannii NOT DETECTED NOT DETECTED Final   Bacteroides fragilis NOT DETECTED NOT DETECTED Final   Enterobacterales DETECTED (A) NOT DETECTED Final    Comment: Enterobacterales represent a large order of gram negative bacteria, not a single organism. CRITICAL RESULT CALLED TO, READ BACK BY AND VERIFIED WITH: PHARMD A MEYER [phone]    Enterobacter cloacae complex NOT DETECTED NOT DETECTED Final   Escherichia coli DETECTED (A) NOT DETECTED Final    Comment: CRITICAL RESULT CALLED TO, READ BACK BY AND VERIFIED WITH: PHARMD A MEYER [phone]    Klebsiella aerogenes NOT DETECTED NOT DETECTED Final   Klebsiella oxytoca NOT DETECTED NOT DETECTED Final   Klebsiella pneumoniae NOT DETECTED NOT DETECTED Final   Proteus species NOT DETECTED NOT DETECTED Final   Salmonella species NOT DETECTED NOT DETECTED Final   Serratia marcescens NOT DETECTED NOT DETECTED Final   Haemophilus influenzae NOT DETECTED NOT DETECTED Final   Neisseria meningitidis NOT DETECTED NOT DETECTED Final   Pseudomonas aeruginosa DETECTED (A) NOT DETECTED Final    Comment: CRITICAL RESULT CALLED TO, READ BACK BY AND VERIFIED WITH: PHARMD A MEYER [phone]     Stenotrophomonas maltophilia NOT DETECTED NOT DETECTED Final   Candida albicans NOT DETECTED NOT DETECTED Final   Candida auris NOT DETECTED NOT DETECTED Final   Candida glabrata NOT DETECTED NOT DETECTED Final   Candida krusei NOT DETECTED NOT DETECTED Final   Candida parapsilosis NOT DETECTED NOT DETECTED Final   Candida tropicalis NOT DETECTED NOT DETECTED Final   Cryptococcus neoformans/gattii NOT DETECTED NOT DETECTED Final   CTX-M ESBL NOT DETECTED NOT DETECTED Final   Carbapenem resistance IMP NOT DETECTED NOT DETECTED Final   Carbapenem resistance KPC NOT DETECTED NOT DETECTED Final   Carbapenem resistance NDM NOT DETECTED NOT DETECTED Final   Carbapenem resist OXA 48 LIKE NOT DETECTED NOT DETECTED Final   Carbapenem resistance VIM NOT DETECTED NOT DETECTED Final    Comment: Performed at [facility], [address] [place], [place] [address]  Culture, Urine     Status: None   Collection Time: 03/25/20 12:00 AM   Specimen: Urine, Random  Result Value Ref Range Status   Specimen Description URINE,  RANDOM  Final   Special Requests NONE  Final   Culture   Final    NO GROWTH Performed at [facility], [address] [place], [place] [address]    Report Status 03/26/2020 FINAL  Final  Resp Panel by RT-PCR (Flu A&B, Covid) Nasopharyngeal Swab     Status: None   Collection Time: 03/25/20 12:14 AM   Specimen: Nasopharyngeal Swab; Nasopharyngeal(NP) swabs in vial transport medium  Result Value Ref Range Status   SARS Coronavirus 2 by RT PCR NEGATIVE NEGATIVE Final    Comment: (NOTE) SARS-CoV-2 target nucleic acids are NOT DETECTED.  The SARS-CoV-2 RNA is generally detectable in upper respiratory specimens during the acute phase of infection. The lowest concentration of SARS-CoV-2 viral copies this assay can detect is 138 copies/mL. A negative result does not preclude SARS-Cov-2 infection and should not be used as the sole basis for treatment or other patient  management decisions. A negative result may occur with  improper specimen collection/handling, submission of specimen other than nasopharyngeal swab, presence of viral mutation(s) within the areas targeted by this assay, and inadequate number of viral copies(<138 copies/mL). A negative result must be combined with clinical observations, patient history, and epidemiological information. The expected result is Negative.  Fact Sheet for Patients:  BloggerCourse.com  Fact Sheet for Healthcare Providers:  SeriousBroker.it  This test is no t yet approved or cleared by the [place] FDA and  has been authorized for detection and/or diagnosis of SARS-CoV-2 by FDA under an Emergency Use Authorization (EUA). This EUA will remain  in effect (meaning this test can be used) for the duration of the COVID-19 declaration under Section 564(b)(1) of the Act, 21 U.S.C.section 360bbb-3(b)(1), unless the authorization is terminated  or revoked sooner.       Influenza A by PCR NEGATIVE NEGATIVE Final   Influenza B by PCR NEGATIVE NEGATIVE Final    Comment: (NOTE) The Xpert Xpress SARS-CoV-2/FLU/RSV plus assay is intended as an aid in the diagnosis of influenza from Nasopharyngeal swab specimens and should not be used as a sole basis for treatment. Nasal washings and aspirates are unacceptable for Xpert Xpress SARS-CoV-2/FLU/RSV testing.  Fact Sheet for Patients: BloggerCourse.com  Fact Sheet for Healthcare Providers: SeriousBroker.it  This test is not yet approved or cleared by the [place] FDA and has been authorized for detection and/or diagnosis of SARS-CoV-2 by FDA under an Emergency Use Authorization (EUA). This EUA will remain in effect (meaning this test can be used) for the duration of the COVID-19 declaration under Section 564(b)(1) of the Act, 21 U.S.C. section 360bbb-3(b)(1),  unless the authorization is terminated or revoked.  Performed at [facility], [address] [place], [place] [address]   Gastrointestinal Panel by PCR , Stool     Status: None   Collection Time: 03/25/20  1:35 AM   Specimen: STOOL  Result Value Ref Range Status   Campylobacter species NOT DETECTED NOT DETECTED Final   Plesimonas shigelloides NOT DETECTED NOT DETECTED Final   Salmonella species NOT DETECTED NOT DETECTED Final   Yersinia enterocolitica NOT DETECTED NOT DETECTED Final   Vibrio species NOT DETECTED NOT DETECTED Final   Vibrio cholerae NOT DETECTED NOT DETECTED Final   Enteroaggregative E coli (EAEC) NOT DETECTED NOT DETECTED Final   Enteropathogenic E coli (EPEC) NOT DETECTED NOT DETECTED Final   Enterotoxigenic E coli (ETEC) NOT DETECTED NOT DETECTED Final   Shiga like toxin producing E coli (STEC) NOT DETECTED NOT DETECTED  Final   Shigella/Enteroinvasive E coli (EIEC) NOT DETECTED NOT DETECTED Final   Cryptosporidium NOT DETECTED NOT DETECTED Final   Cyclospora cayetanensis NOT DETECTED NOT DETECTED Final   Entamoeba histolytica NOT DETECTED NOT DETECTED Final   Giardia lamblia NOT DETECTED NOT DETECTED Final   Adenovirus F40/41 NOT DETECTED NOT DETECTED Final   Astrovirus NOT DETECTED NOT DETECTED Final   Norovirus GI/GII NOT DETECTED NOT DETECTED Final   Rotavirus A NOT DETECTED NOT DETECTED Final   Sapovirus (I, II, IV, and V) NOT DETECTED NOT DETECTED Final    Comment: Performed at [facility], [address], [place], [place] [address]  C Difficile Quick Screen w PCR reflex     Status: None   Collection Time: 03/25/20  1:35 AM   Specimen: STOOL  Result Value Ref Range Status   C Diff antigen NEGATIVE NEGATIVE Final   C Diff toxin NEGATIVE NEGATIVE Final   C Diff interpretation No C. difficile detected.  Final    Comment: Performed at [facility], [address], [place], [place] [address]  MRSA PCR Screening     Status: None    Collection Time: 03/25/20  5:06 AM   Specimen: Nasal Mucosa; Nasopharyngeal  Result Value Ref Range Status   MRSA by PCR NEGATIVE NEGATIVE Final    Comment:        The GeneXpert MRSA Assay (FDA approved for NASAL specimens only), is one component of a comprehensive MRSA colonization surveillance program. It is not intended to diagnose MRSA infection nor to guide or monitor treatment for MRSA infections. Performed at [facility], [address], [place], [place] [address]       Radiology Studies: MR ABDOMEN W WO CONTRAST  Result Date: 03/26/2020 CLINICAL DATA:  Characterize pancreatic lesion EXAM: MRI ABDOMEN WITHOUT AND WITH CONTRAST TECHNIQUE: Multiplanar multisequence MR imaging of the abdomen was performed both before and after the administration of intravenous contrast. CONTRAST:  8mL GADAVIST GADOBUTROL 1 MMOL/ML IV SOLN COMPARISON:  CT chest abdomen pelvis, 03/24/2020 FINDINGS: Lower chest: No acute findings. Hepatobiliary: Hepatic steatosis. No mass or other parenchymal abnormality identified. No biliary ductal dilatation. Pancreas: There is no mass or abnormal contrast enhancement in the central pancreatic head to correlate to finding of prior CT (series 21, image 57). No other evidence of mass, inflammatory changes, or other parenchymal abnormality identified. No pancreatic ductal dilatation. Spleen:  Within normal limits in size and appearance. Adrenals/Urinary Tract: No masses identified. No evidence of hydronephrosis. Stomach/Bowel: Colonic diverticula. Visualized portions within the abdomen are otherwise unremarkable. Vascular/Lymphatic: No pathologically enlarged lymph nodes identified. No abdominal aortic aneurysm demonstrated. Other:  None. Musculoskeletal: No suspicious bone lesions identified. IMPRESSION: 1. No pancreatic mass or other significant abnormality identified. There is no MR correlate for a small lesion in the central pancreatic head suspected to be  arterially hyperenhancing on prior CT, although not included in noncontrast images of the chest performed on that examination. In particular there is no focal signal abnormality or hyperenhancement on multiphasic contrast enhanced examination, and this finding on prior CT is of uncertain significance, possibly reflecting a small focus of calcification in the pancreatic parenchyma. In the absence of any suspicious MR finding, a pancreatic neuroendocrine tumor is not favored, and there are no concerning secondary features such as lymphadenopathy or pancreatic ductal dilatation. Consider follow-up CT of the abdomen without and with contrast in 6 months to assess for stability and true contrast enhancement characteristics as lesion was originally  appreciated on this modality. 2. Hepatic steatosis. Electronically Signed   By: Lauralyn Primes M.D.   On: 03/26/2020 18:16   ECHOCARDIOGRAM COMPLETE  Result Date: 03/25/2020    ECHOCARDIOGRAM REPORT   Patient Name:   MERIT MAYBEE Date of Exam: 03/25/2020 Medical Rec #:  [id number]     Height:       63.0 in Accession #:    [id number]    Weight:       174.4 lb Date of Birth:  25-Mar-1961      BSA:          1.824 m Patient Age:    59 years      BP:           91/68 mmHg Patient Gender: M             HR:           60 bpm. Exam Location:  Inpatient Procedure: 2D Echo, Cardiac Doppler and Color Doppler Indications:    Elevated troponin  History:        Patient has no prior history of Echocardiogram examinations.                 Risk Factors:Hypertension. Sepsis, colitis.  Sonographer:    Lavenia Atlas Referring Phys: [phone] Osvaldo Shipper IMPRESSIONS  1. Left ventricular ejection fraction, by estimation, is 55 to 60%. The left ventricle has normal function. The left ventricle has no regional wall motion abnormalities. Left ventricular diastolic parameters were normal.  2. Right ventricular systolic function is normal. The right ventricular size is mildly enlarged.  3. The mitral  valve is normal in structure. Trivial mitral valve regurgitation. No evidence of mitral stenosis.  4. The aortic valve is tricuspid. Aortic valve regurgitation is not visualized. No aortic stenosis is present.  5. The inferior vena cava is normal in size with greater than 50% respiratory variability, suggesting right atrial pressure of 3 mmHg. FINDINGS  Left Ventricle: Left ventricular ejection fraction, by estimation, is 55 to 60%. The left ventricle has normal function. The left ventricle has no regional wall motion abnormalities. The left ventricular internal cavity size was normal in size. There is  no left ventricular hypertrophy. Left ventricular diastolic parameters were normal. Right Ventricle: The right ventricular size is mildly enlarged. No increase in right ventricular wall thickness. Right ventricular systolic function is normal. Left Atrium: Left atrial size was normal in size. Right Atrium: Right atrial size was normal in size. Pericardium: There is no evidence of pericardial effusion. Mitral Valve: The mitral valve is normal in structure. Trivial mitral valve regurgitation. No evidence of mitral valve stenosis. Tricuspid Valve: The tricuspid valve is normal in structure. Tricuspid valve regurgitation is not demonstrated. No evidence of tricuspid stenosis. Aortic Valve: The aortic valve is tricuspid. Aortic valve regurgitation is not visualized. No aortic stenosis is present. Pulmonic Valve: The pulmonic valve was normal in structure. Pulmonic valve regurgitation is not visualized. No evidence of pulmonic stenosis. Aorta: The aortic root is normal in size and structure. Venous: The inferior vena cava is normal in size with greater than 50% respiratory variability, suggesting right atrial pressure of 3 mmHg. IAS/Shunts: No atrial level shunt detected by color flow Doppler.  LEFT VENTRICLE PLAX 2D LVIDd:         5.10 cm  Diastology LVIDs:         3.00 cm  LV e' medial:    9.46 cm/s LV PW:  1.10  cm  LV E/e' medial:  5.7 LV IVS:        1.00 cm  LV e' lateral:   12.30 cm/s LVOT diam:     2.00 cm  LV E/e' lateral: 4.4 LV SV:         51 LV SV Index:   28 LVOT Area:     3.14 cm  RIGHT VENTRICLE RV Basal diam:  3.30 cm RV S prime:     5.87 cm/s TAPSE (M-mode): 2.5 cm LEFT ATRIUM             Index       RIGHT ATRIUM           Index LA diam:        3.40 cm 1.86 cm/m  RA Area:     12.40 cm LA Vol (A2C):   47.0 ml 25.76 ml/m RA Volume:   30.50 ml  16.72 ml/m LA Vol (A4C):   33.8 ml 18.53 ml/m LA Biplane Vol: 43.9 ml 24.06 ml/m  AORTIC VALVE LVOT Vmax:   75.70 cm/s LVOT Vmean:  53.200 cm/s LVOT VTI:    0.161 m  AORTA Ao Root diam: 2.80 cm MITRAL VALVE MV Area (PHT): 3.17 cm    SHUNTS MV Decel Time: 239 msec    Systemic VTI:  0.16 m MV E velocity: 53.60 cm/s  Systemic Diam: 2.00 cm MV A velocity: 39.40 cm/s MV E/A ratio:  1.36 Charlton Haws MD Electronically signed by Charlton Haws MD Signature Date/Time: 03/25/2020/2:37:29 PM    Final        LOS: 2 days   Osvaldo Shipper  [facility] Pager on [email or web address]  03/27/2020, 9:37 AM

## 2020-03-28 LAB — COMPREHENSIVE METABOLIC PANEL
ALT: 174 U/L — ABNORMAL HIGH (ref 0–44)
AST: 90 U/L — ABNORMAL HIGH (ref 15–41)
Albumin: 2.9 g/dL — ABNORMAL LOW (ref 3.5–5.0)
Alkaline Phosphatase: 55 U/L (ref 38–126)
Anion gap: 8 (ref 5–15)
BUN: 15 mg/dL (ref 6–20)
CO2: 24 mmol/L (ref 22–32)
Calcium: 8.9 mg/dL (ref 8.9–10.3)
Chloride: 106 mmol/L (ref 98–111)
Creatinine, Ser: 0.99 mg/dL (ref 0.61–1.24)
GFR, Estimated: >60 mL/min (ref >60)
Glucose, Bld: 108 mg/dL — ABNORMAL HIGH (ref 70–99)
Potassium: 4 mmol/L (ref 3.5–5.1)
Sodium: 138 mmol/L (ref 135–145)
Total Bilirubin: 0.6 mg/dL (ref 0.3–1.2)
Total Protein: 6.2 g/dL — ABNORMAL LOW (ref 6.5–8.1)

## 2020-03-28 LAB — CBC
HCT: 37.3 % — ABNORMAL LOW (ref 39.0–52.0)
Hemoglobin: 12.3 g/dL — ABNORMAL LOW (ref 13.0–17.0)
MCH: 31.7 pg (ref 26.0–34.0)
MCHC: 33 g/dL (ref 30.0–36.0)
MCV: 96.1 fL (ref 80.0–100.0)
Platelets: 225 10*3/uL (ref 150–400)
RBC: 3.88 MIL/uL — ABNORMAL LOW (ref 4.22–5.81)
RDW: 13.2 % (ref 11.5–15.5)
WBC: 5.2 10*3/uL (ref 4.0–10.5)
nRBC: 0 % (ref 0.0–0.2)

## 2020-03-28 MED ORDER — LISINOPRIL 10 MG PO TABS
10.0000 mg | ORAL_TABLET | Freq: Every day | ORAL | Status: DC
  Administered 2020-03-28 – 2020-03-29 (×2): 10 mg via ORAL
  Filled 2020-03-28 (×2): qty 1

## 2020-03-28 NOTE — Progress Notes (Signed)
Notified Dr. Rito Ehrlich that pt's HR has been maintaining in the 40's and is asymptomatic. No new orders. Emelda Brothers RN

## 2020-03-28 NOTE — Progress Notes (Addendum)
[facility] PROGRESS NOTE   [name]:[id number] DOB: 28-Feb-1961 DOA: 03/24/2020  PCP: Perry Zamora, No Pcp Per  Brief History/Interval Summary: 59 y.o. male with medical history significant of HTN on just lisinopril.  Has not followed up with his primary care provider in many years.  Presented with acute diarrhea feeling poorly.  Noted to be hypotensive in the emergency department.  Was also tachycardic and diaphoretic.  Had a fever.  Was given IV fluid bolus.  Underwent CT scan which raised concern for colitis.  He was hospitalized for further management.  Reason for Visit: Severe sepsis with septic shock.  Gram-negative bacteremia  Consultants: None  Procedures: None yet  Antibiotics: Anti-infectives (From admission, onward)   Start     Dose/Rate Route Frequency Ordered Stop   03/26/20 2000  ciprofloxacin (CIPRO) tablet 500 mg  Status:  Discontinued        500 mg Oral 2 times daily 03/26/20 1048 03/26/20 1803   03/26/20 1900  ceFEPIme (MAXIPIME) 2 g in sodium chloride 0.9 % 100 mL IVPB        2 g 200 mL/hr over 30 Minutes Intravenous Every 8 hours 03/26/20 1803     03/26/20 1700  ceFEPIme (MAXIPIME) 2 g in sodium chloride 0.9 % 100 mL IVPB  Status:  Discontinued        2 g 200 mL/hr over 30 Minutes Intravenous Every 8 hours 03/26/20 0917 03/26/20 1048   03/26/20 0000  vancomycin (VANCOCIN) IVPB 1000 mg/200 mL premix  Status:  Discontinued        1,000 mg 200 mL/hr over 60 Minutes Intravenous Every 24 hours 03/25/20 0017 03/26/20 1048   03/25/20 1000  ceFEPIme (MAXIPIME) 2 g in sodium chloride 0.9 % 100 mL IVPB  Status:  Discontinued        2 g 200 mL/hr over 30 Minutes Intravenous Every 12 hours 03/25/20 0017 03/26/20 0917   03/25/20 0000  ceFEPIme (MAXIPIME) 2 g in sodium chloride 0.9 % 100 mL IVPB        2 g 200 mL/hr over 30 Minutes Intravenous  Once 03/24/20 2359 03/25/20 0118   03/25/20 0000  vancomycin (VANCOCIN) IVPB 1000 mg/200 mL premix        1,000 mg 200  mL/hr over 60 Minutes Intravenous  Once 03/24/20 2359 03/25/20 0217   03/25/20 0000  metroNIDAZOLE (FLAGYL) tablet 500 mg        500 mg Oral Every 8 hours 03/24/20 2359     03/24/20 2330  cefTRIAXone (ROCEPHIN) 1 g in sodium chloride 0.9 % 100 mL IVPB  Status:  Discontinued        1 g 200 mL/hr over 30 Minutes Intravenous  Once 03/24/20 2319 03/25/20 0000      Subjective/Interval History: Perry Zamora mentions that he is feeling well.  Occasional mild headaches which he has had previously as well.  Denies any vision impairment or any weakness in any 1 side.  Denies any nausea vomiting.  No abdominal pain.  Wants to go home     Assessment/Plan:  Acute colitis with severe sepsis with septic shock/gram-negative bacteremia Perry Zamora found to have evidence for colitis on CT scan.  Elevated lactic acid level at 5.6 and was noted to be hypotensive and tachycardic.  Procalcitonin level noted to be extremely elevated at 52.  Surprisingly Perry Zamora did not have any leukocytosis however.  Cortisol level 21.4.  C. difficile testing negative.  GI pathogen panel is pending.   He was aggressively  hydrated.  Improvement in lactic acid level noted.  Calcitonin improved from 52-9.3.  WBC remains normal.  He was initially treated with vancomycin cefepime and metronidazole.  Vancomycin was subsequently discontinued.  MRSA PCR was negative. Cefepime was initially changed to Cipro however blood cultures were subsequently reported as growing gram-negative bacteria.  Placed back on cefepime.  He is growing E. coli and Pseudomonas.  Waiting on final sensitivities.   Remains on metronidazole which we will give him for a total of 5 days.  Remains afebrile.    Acute kidney injury/hyponatremia/hypokalemia Creatinine 1.98 at presentation.  Resolved with IV hydration.  Sodium is now normal.  Potassium is better as well  Transaminitis Most likely due to sepsis.  Abdomen remains benign.  Hepatitis panel as noted to be unremarkable.   LFTs are improving daily.    Elevated troponin Perry Zamora denied any chest pain.  Initial troponin was 1266, then 1390.  EKG did not show any acute ischemic changes.  Perry Zamora denies any history of heart disease.   Echocardiogram shows normal systolic function.  No wall motion abnormalities noted.  Presentation likely due to demand ischemia as the Perry Zamora was quite hypotensive and tachycardic at presentation.   Perry Zamora started on aspirin.  His troponin level is noted to be 160 on 12/13.  Do not anticipate any further inpatient work-up.  He will need to be referred to cardiology in the outpatient setting.    Essential hypertension Perry Zamora on ACE inhibitor at home which was held due to renal failure.  Blood pressure now in the hypertensive range.  We will resume it.  Abnormal-appearing pancreas on CT scan Incidentally noted on CT scan.  MRI was done which does not show any pancreatic lesions.  Communicated to the Perry Zamora.     Pulmonary nodule He denies being a smoker.  May not need further work-up or repeat CT in 1 year could be considered.  This was communicated to the Perry Zamora.  He will need to establish with primary care provider.  Normocytic anemia No evidence of overt bleeding.  Drop in hemoglobin is likely dilutional.  Hemoglobin remained stable.  Obesity Estimated body mass index is 31.09 kg/m as calculated from the following:   Height as of this encounter: 5\' 3"  (1.6 m).   Weight as of this encounter: 79.6 kg.   DVT Prophylaxis: Lovenox Code Status: Full code Family Communication: Discussed with the Perry Zamora.  Sister updated as well.   Disposition Plan: Hopefully return home when improved  Status is: Inpatient  Remains inpatient appropriate because:IV treatments appropriate due to intensity of illness or inability to take PO and Inpatient level of care appropriate due to severity of illness   Dispo: The Perry Zamora is from: Home              Anticipated d/c is to: Home               Anticipated d/c date is: 2 days              Perry Zamora currently is not medically stable to d/c.       Medications:  Scheduled: . aspirin EC  81 mg Oral Daily  . enoxaparin (LOVENOX) injection  40 mg Subcutaneous Q24H  . metroNIDAZOLE  500 mg Oral Q8H   Continuous: . ceFEPime (MAXIPIME) IV Stopped (03/28/20 0730)   XBJ:[id number]:acetaminophen **OR** acetaminophen, LORazepam, ondansetron **OR** ondansetron (ZOFRAN) IV   Objective:  Vital Signs  Vitals:   03/26/20 1909 03/27/20 0517 03/27/20 2251 03/28/20 [id number]  BP: (!) 132/96 (!) 143/102 (!) 142/98 (!) 147/95  Pulse: 69 61 (!) 59 67  Resp: 16 17 16 16   Temp: 99.1 F (37.3 C) 98.7 F (37.1 C) 98 F (36.7 C) 97.7 F (36.5 C)  TempSrc: Oral Oral Oral Oral  SpO2: 100% 98% 98% 96%  Weight:    79.6 kg  Height:        Intake/Output Summary (Last 24 hours) at 03/28/2020 0945 Last data filed at 03/28/2020 0734 Gross per 24 hour  Intake 323.89 ml  Output 625 ml  Net -301.11 ml   Filed Weights   03/24/20 2137 03/25/20 0400 03/28/20 0622  Weight: 63.5 kg 79.1 kg 79.6 kg    General appearance: Awake alert.  In no distress Resp: Clear to auscultation bilaterally.  Normal effort Cardio: S1-S2 is normal regular.  No S3-S4.  No rubs murmurs or bruit GI: Abdomen is soft.  Nontender nondistended.  Bowel sounds are present normal.  No masses organomegaly Extremities: No edema.  Full range of motion of lower extremities. Neurologic: Alert and oriented x3.  No focal neurological deficits.      Lab Results:  Data Reviewed: I have personally reviewed following labs and imaging studies  CBC: Recent Labs  Lab 03/24/20 2116 03/24/20 2247 03/25/20 0406 03/26/20 0545 03/27/20 0628 03/28/20 0253  WBC 5.6  --  8.6 6.7 5.0 5.2  NEUTROABS 4.1  --   --   --   --   --   HGB 16.2 13.6 13.1 11.9* 12.3* 12.3*  HCT 46.4 40.0 37.3* 35.5* 35.4* 37.3*  MCV 94.3  --  94.9 95.9 94.4 96.1  PLT 189  --  156 163 192 225    Basic Metabolic  Panel: Recent Labs  Lab 03/24/20 2239 03/24/20 2247 03/25/20 0406 03/26/20 0545 03/27/20 0628 03/28/20 0253  NA 132* 133* 131* 134* 137 138  K 3.2* 3.3* 3.7 3.8 3.5 4.0  CL 101 100 102 105 104 106  CO2 19*  --  21* 19* 23 24  GLUCOSE 113* 106* 120* 81 108* 108*  BUN 21* 22* 19 16 13 15   CREATININE 1.98* 1.80* 1.60* 1.08 0.93 0.99  CALCIUM 8.3*  --  8.6* 8.4* 8.8* 8.9  MG  --   --   --  1.8  --   --     GFR: Estimated Creatinine Clearance: 76.9 mL/min (by C-G formula based on SCr of 0.99 mg/dL).  Liver Function Tests: Recent Labs  Lab 03/24/20 2239 03/25/20 0406 03/26/20 0545 03/27/20 0628 03/28/20 0253  AST 137* 272* 311* 129* 90*  ALT 93* 162* 348* 228* 174*  ALKPHOS 49 43 41 56 55  BILITOT 0.8 0.4 0.8 0.7 0.6  PROT 6.5 6.8 6.1* 6.2* 6.2*  ALBUMIN 3.2* 3.2* 2.9* 3.0* 2.9*     Coagulation Profile: Recent Labs  Lab 03/24/20 2121 03/25/20 0406  INR 1.0 1.2     Recent Results (from the past 240 hour(s))  Blood culture (routine x 2)     Status: None (Preliminary result)   Collection Time: 03/24/20  9:36 PM   Specimen: BLOOD  Result Value Ref Range Status   Specimen Description BLOOD RIGHT ANTECUBITAL  Final   Special Requests   Final    BOTTLES DRAWN AEROBIC AND ANAEROBIC Blood Culture results may not be optimal due to an excessive volume of blood received in culture bottles   Culture   Final    NO GROWTH 3 DAYS Performed at [facility] Lab, [address] [place], [place] [address]    Report Status PENDING  Incomplete  Blood culture (routine x 2)     Status: Abnormal (Preliminary result)   Collection Time: 03/24/20  9:37 PM   Specimen: BLOOD  Result Value Ref Range Status   Specimen Description BLOOD BLOOD LEFT FOREARM  Final   Special Requests   Final    BOTTLES DRAWN AEROBIC AND ANAEROBIC Blood Culture results may not be optimal due to an excessive volume of blood received in culture bottles   Culture  Setup Time   Final    GRAM NEGATIVE  RODS ANAEROBIC BOTTLE ONLY Organism ID to follow CRITICAL RESULT CALLED TO, READ BACK BY AND VERIFIED WITH: PHARMD A MEYER [id number] Performed at [facility], [address], [place], [place] [address]    Culture ESCHERICHIA COLI PSEUDOMONAS AERUGINOSA  (A)  Final   Report Status PENDING  Incomplete   Organism ID, Bacteria ESCHERICHIA COLI  Final      Susceptibility   Escherichia coli - MIC*    AMPICILLIN <=2 SENSITIVE Sensitive     CEFAZOLIN <=4 SENSITIVE Sensitive     CEFEPIME <=0.12 SENSITIVE Sensitive     CEFTAZIDIME <=1 SENSITIVE Sensitive     CEFTRIAXONE <=0.25 SENSITIVE Sensitive     CIPROFLOXACIN <=0.25 SENSITIVE Sensitive     GENTAMICIN <=1 SENSITIVE Sensitive     IMIPENEM <=0.25 SENSITIVE Sensitive     TRIMETH/SULFA <=20 SENSITIVE Sensitive     AMPICILLIN/SULBACTAM <=2 SENSITIVE Sensitive     PIP/TAZO <=4 SENSITIVE Sensitive     * ESCHERICHIA COLI  Blood Culture ID Panel (Reflexed)     Status: Abnormal   Collection Time: 03/24/20  9:37 PM  Result Value Ref Range Status   Enterococcus faecalis NOT DETECTED NOT DETECTED Final   Enterococcus Faecium NOT DETECTED NOT DETECTED Final   Listeria monocytogenes NOT DETECTED NOT DETECTED Final   Staphylococcus species NOT DETECTED NOT DETECTED Final   Staphylococcus aureus (BCID) NOT DETECTED NOT DETECTED Final   Staphylococcus epidermidis NOT DETECTED NOT DETECTED Final   Staphylococcus lugdunensis NOT DETECTED NOT DETECTED Final   Streptococcus species NOT DETECTED NOT DETECTED Final   Streptococcus agalactiae NOT DETECTED NOT DETECTED Final   Streptococcus pneumoniae NOT DETECTED NOT DETECTED Final   Streptococcus pyogenes NOT DETECTED NOT DETECTED Final   A.calcoaceticus-baumannii NOT DETECTED NOT DETECTED Final   Bacteroides fragilis NOT DETECTED NOT DETECTED Final   Enterobacterales DETECTED (A) NOT DETECTED Final    Comment: Enterobacterales represent a large order of gram negative bacteria, not a single  organism. CRITICAL RESULT CALLED TO, READ BACK BY AND VERIFIED WITH: PHARMD A MEYER [phone] MLM    Enterobacter cloacae complex NOT DETECTED NOT DETECTED Final   Escherichia coli DETECTED (A) NOT DETECTED Final    Comment: CRITICAL RESULT CALLED TO, READ BACK BY AND VERIFIED WITH: PHARMD A MEYER [phone] MLM    Klebsiella aerogenes NOT DETECTED NOT DETECTED Final   Klebsiella oxytoca NOT DETECTED NOT DETECTED Final   Klebsiella pneumoniae NOT DETECTED NOT DETECTED Final   Proteus species NOT DETECTED NOT DETECTED Final   Salmonella species NOT DETECTED NOT DETECTED Final   Serratia marcescens NOT DETECTED NOT DETECTED Final   Haemophilus influenzae NOT DETECTED NOT DETECTED Final   Neisseria meningitidis NOT DETECTED NOT DETECTED Final   Pseudomonas aeruginosa DETECTED (A) NOT DETECTED Final    Comment: CRITICAL RESULT CALLED TO, READ BACK BY AND VERIFIED WITH: PHARMD A MEYER [id number]  1727 MLM    Stenotrophomonas maltophilia NOT DETECTED NOT DETECTED Final   Candida albicans NOT DETECTED NOT DETECTED Final   Candida auris NOT DETECTED NOT DETECTED Final   Candida glabrata NOT DETECTED NOT DETECTED Final   Candida krusei NOT DETECTED NOT DETECTED Final   Candida parapsilosis NOT DETECTED NOT DETECTED Final   Candida tropicalis NOT DETECTED NOT DETECTED Final   Cryptococcus neoformans/gattii NOT DETECTED NOT DETECTED Final   CTX-M ESBL NOT DETECTED NOT DETECTED Final   Carbapenem resistance IMP NOT DETECTED NOT DETECTED Final   Carbapenem resistance KPC NOT DETECTED NOT DETECTED Final   Carbapenem resistance NDM NOT DETECTED NOT DETECTED Final   Carbapenem resist OXA 48 LIKE NOT DETECTED NOT DETECTED Final   Carbapenem resistance VIM NOT DETECTED NOT DETECTED Final    Comment: Performed at [facility], [address], [place], [place] [address]  Culture, Urine     Status: None   Collection Time: 03/25/20 12:00 AM   Specimen: Urine, Random  Result Value Ref Range  Status   Specimen Description URINE, RANDOM  Final   Special Requests NONE  Final   Culture   Final    NO GROWTH Performed at [facility], [address] [place], [place] [address]    Report Status 03/26/2020 FINAL  Final  Resp Panel by RT-PCR (Flu A&B, Covid) Nasopharyngeal Swab     Status: None   Collection Time: 03/25/20 12:14 AM   Specimen: Nasopharyngeal Swab; Nasopharyngeal(NP) swabs in vial transport medium  Result Value Ref Range Status   SARS Coronavirus 2 by RT PCR NEGATIVE NEGATIVE Final    Comment: (NOTE) SARS-CoV-2 target nucleic acids are NOT DETECTED.  The SARS-CoV-2 RNA is generally detectable in upper respiratory specimens during the acute phase of infection. The lowest concentration of SARS-CoV-2 viral copies this assay can detect is 138 copies/mL. A negative result does not preclude SARS-Cov-2 infection and should not be used as the sole basis for treatment or other Perry Zamora management decisions. A negative result may occur with  improper specimen collection/handling, submission of specimen other than nasopharyngeal swab, presence of viral mutation(s) within the areas targeted by this assay, and inadequate number of viral copies(<138 copies/mL). A negative result must be combined with clinical observations, Perry Zamora history, and epidemiological information. The expected result is Negative.  Fact Sheet for Patients:  BloggerCourse.com  Fact Sheet for Healthcare Providers:  SeriousBroker.it  This test is no t yet approved or cleared by the [place] FDA and  has been authorized for detection and/or diagnosis of SARS-CoV-2 by FDA under an Emergency Use Authorization (EUA). This EUA will remain  in effect (meaning this test can be used) for the duration of the COVID-19 declaration under Section 564(b)(1) of the Act, 21 U.S.C.section 360bbb-3(b)(1), unless the authorization is terminated  or revoked  sooner.       Influenza A by PCR NEGATIVE NEGATIVE Final   Influenza B by PCR NEGATIVE NEGATIVE Final    Comment: (NOTE) The Xpert Xpress SARS-CoV-2/FLU/RSV plus assay is intended as an aid in the diagnosis of influenza from Nasopharyngeal swab specimens and should not be used as a sole basis for treatment. Nasal washings and aspirates are unacceptable for Xpert Xpress SARS-CoV-2/FLU/RSV testing.  Fact Sheet for Patients: BloggerCourse.com  Fact Sheet for Healthcare Providers: SeriousBroker.it  This test is not yet approved or cleared by the [place] FDA and has been authorized for detection and/or diagnosis of SARS-CoV-2 by FDA under an Emergency  Use Authorization (EUA). This EUA will remain in effect (meaning this test can be used) for the duration of the COVID-19 declaration under Section 564(b)(1) of the Act, 21 U.S.C. section 360bbb-3(b)(1), unless the authorization is terminated or revoked.  Performed at [facility] Lab, [address] [place], [place] [address]   Gastrointestinal Panel by PCR , Stool     Status: None   Collection Time: 03/25/20  1:35 AM   Specimen: STOOL  Result Value Ref Range Status   Campylobacter species NOT DETECTED NOT DETECTED Final   Plesimonas shigelloides NOT DETECTED NOT DETECTED Final   Salmonella species NOT DETECTED NOT DETECTED Final   Yersinia enterocolitica NOT DETECTED NOT DETECTED Final   Vibrio species NOT DETECTED NOT DETECTED Final   Vibrio cholerae NOT DETECTED NOT DETECTED Final   Enteroaggregative E coli (EAEC) NOT DETECTED NOT DETECTED Final   Enteropathogenic E coli (EPEC) NOT DETECTED NOT DETECTED Final   Enterotoxigenic E coli (ETEC) NOT DETECTED NOT DETECTED Final   Shiga like toxin producing E coli (STEC) NOT DETECTED NOT DETECTED Final   Shigella/Enteroinvasive E coli (EIEC) NOT DETECTED NOT DETECTED Final   Cryptosporidium NOT DETECTED NOT DETECTED Final    Cyclospora cayetanensis NOT DETECTED NOT DETECTED Final   Entamoeba histolytica NOT DETECTED NOT DETECTED Final   Giardia lamblia NOT DETECTED NOT DETECTED Final   Adenovirus F40/41 NOT DETECTED NOT DETECTED Final   Astrovirus NOT DETECTED NOT DETECTED Final   Norovirus GI/GII NOT DETECTED NOT DETECTED Final   Rotavirus A NOT DETECTED NOT DETECTED Final   Sapovirus (I, II, IV, and V) NOT DETECTED NOT DETECTED Final    Comment: Performed at [facility], [address] [place], [place] [address]  C Difficile Quick Screen w PCR reflex     Status: None   Collection Time: 03/25/20  1:35 AM   Specimen: STOOL  Result Value Ref Range Status   C Diff antigen NEGATIVE NEGATIVE Final   C Diff toxin NEGATIVE NEGATIVE Final   C Diff interpretation No C. difficile detected.  Final    Comment: Performed at [facility], [address], [place], [place] [address]  MRSA PCR Screening     Status: None   Collection Time: 03/25/20  5:06 AM   Specimen: Nasal Mucosa; Nasopharyngeal  Result Value Ref Range Status   MRSA by PCR NEGATIVE NEGATIVE Final    Comment:        The GeneXpert MRSA Assay (FDA approved for NASAL specimens only), is one component of a comprehensive MRSA colonization surveillance program. It is not intended to diagnose MRSA infection nor to guide or monitor treatment for MRSA infections. Performed at [facility], [address] [place], [place] [address]       Radiology Studies: MR ABDOMEN W WO CONTRAST  Result Date: 03/26/2020 CLINICAL DATA:  Characterize pancreatic lesion EXAM: MRI ABDOMEN WITHOUT AND WITH CONTRAST TECHNIQUE: Multiplanar multisequence MR imaging of the abdomen was performed both before and after the administration of intravenous contrast. CONTRAST:  8mL GADAVIST GADOBUTROL 1 MMOL/ML IV SOLN COMPARISON:  CT chest abdomen pelvis, 03/24/2020 FINDINGS: Lower chest: No acute findings. Hepatobiliary: Hepatic steatosis. No mass or other  parenchymal abnormality identified. No biliary ductal dilatation. Pancreas: There is no mass or abnormal contrast enhancement in the central pancreatic head to correlate to finding of prior CT (series 21, image 57). No other evidence of mass, inflammatory changes, or other parenchymal abnormality identified. No pancreatic ductal dilatation. Spleen:  Within normal limits in size and appearance. Adrenals/Urinary Tract: No masses identified. No evidence of hydronephrosis. Stomach/Bowel: Colonic diverticula. Visualized portions within the abdomen are otherwise unremarkable. Vascular/Lymphatic: No pathologically enlarged lymph nodes identified. No abdominal aortic aneurysm demonstrated. Other:  None. Musculoskeletal: No suspicious bone lesions identified. IMPRESSION: 1. No pancreatic mass or other significant abnormality identified. There is no MR correlate for a small lesion in the central pancreatic head suspected to be arterially hyperenhancing on prior CT, although not included in noncontrast images of the chest performed on that examination. In particular there is no focal signal abnormality or hyperenhancement on multiphasic contrast enhanced examination, and this finding on prior CT is of uncertain significance, possibly reflecting a small focus of calcification in the pancreatic parenchyma. In the absence of any suspicious MR finding, a pancreatic neuroendocrine tumor is not favored, and there are no concerning secondary features such as lymphadenopathy or pancreatic ductal dilatation. Consider follow-up CT of the abdomen without and with contrast in 6 months to assess for stability and true contrast enhancement characteristics as lesion was originally appreciated on this modality. 2. Hepatic steatosis. Electronically Signed   By: Lauralyn Primes M.D.   On: 03/26/2020 18:16       LOS: 3 days   Harbour Nordmeyer Rito Ehrlich  [facility] Pager on [email or web address]  03/28/2020, 9:45 AM

## 2020-03-29 ENCOUNTER — Other Ambulatory Visit (HOSPITAL_COMMUNITY): Payer: Self-pay | Admitting: Internal Medicine

## 2020-03-29 DIAGNOSIS — R7401 Elevation of levels of liver transaminase levels: Secondary | ICD-10-CM

## 2020-03-29 DIAGNOSIS — N179 Acute kidney failure, unspecified: Secondary | ICD-10-CM

## 2020-03-29 DIAGNOSIS — K529 Noninfective gastroenteritis and colitis, unspecified: Secondary | ICD-10-CM

## 2020-03-29 DIAGNOSIS — R7881 Bacteremia: Secondary | ICD-10-CM

## 2020-03-29 LAB — COMPREHENSIVE METABOLIC PANEL
ALT: 134 U/L — ABNORMAL HIGH (ref 0–44)
AST: 63 U/L — ABNORMAL HIGH (ref 15–41)
Albumin: 3.2 g/dL — ABNORMAL LOW (ref 3.5–5.0)
Alkaline Phosphatase: 59 U/L (ref 38–126)
Anion gap: 8 (ref 5–15)
BUN: 14 mg/dL (ref 6–20)
CO2: 24 mmol/L (ref 22–32)
Calcium: 9.2 mg/dL (ref 8.9–10.3)
Chloride: 103 mmol/L (ref 98–111)
Creatinine, Ser: 0.85 mg/dL (ref 0.61–1.24)
GFR, Estimated: >60 mL/min (ref >60)
Glucose, Bld: 115 mg/dL — ABNORMAL HIGH (ref 70–99)
Potassium: 4 mmol/L (ref 3.5–5.1)
Sodium: 135 mmol/L (ref 135–145)
Total Bilirubin: 0.6 mg/dL (ref 0.3–1.2)
Total Protein: 6.4 g/dL — ABNORMAL LOW (ref 6.5–8.1)

## 2020-03-29 LAB — CULTURE, BLOOD (ROUTINE X 2): Culture: NO GROWTH

## 2020-03-29 LAB — CBC
HCT: 38.7 % — ABNORMAL LOW (ref 39.0–52.0)
Hemoglobin: 13.1 g/dL (ref 13.0–17.0)
MCH: 32.1 pg (ref 26.0–34.0)
MCHC: 33.9 g/dL (ref 30.0–36.0)
MCV: 94.9 fL (ref 80.0–100.0)
Platelets: 275 10*3/uL (ref 150–400)
RBC: 4.08 MIL/uL — ABNORMAL LOW (ref 4.22–5.81)
RDW: 12.9 % (ref 11.5–15.5)
WBC: 6.2 10*3/uL (ref 4.0–10.5)
nRBC: 0 % (ref 0.0–0.2)

## 2020-03-29 MED ORDER — ASPIRIN 81 MG PO TBEC: 81.0000 mg | DELAYED_RELEASE_TABLET | Freq: Every day | ORAL | 1 refills | Status: DC

## 2020-03-29 MED ORDER — METRONIDAZOLE 500 MG PO TABS: 500.0000 mg | ORAL_TABLET | Freq: Three times a day (TID) | ORAL | 0 refills | Status: DC

## 2020-03-29 MED ORDER — CIPROFLOXACIN HCL 750 MG PO TABS: 750.0000 mg | ORAL_TABLET | Freq: Two times a day (BID) | ORAL | 0 refills | Status: DC

## 2020-03-29 MED ORDER — LISINOPRIL 10 MG PO TABS: 10.0000 mg | ORAL_TABLET | Freq: Every day | ORAL | 1 refills | Status: DC

## 2020-03-29 MED FILL — CIPROFLOXACIN HCL 750 MG TA: 750 | 5 days supply | Qty: 5 | Fill #0

## 2020-03-29 MED FILL — LISINOPRIL 10 MG TABS: 10 | 30 days supply | Qty: 30 | Fill #0

## 2020-03-29 MED FILL — ASPIRIN LOW DOSE 81 MG TBEC: 81 | 30 days supply | Qty: 30 | Fill #0

## 2020-03-29 MED FILL — metroNIDAZOLE 500 MG TABS: 500 | 7 days supply | Qty: 7 | Fill #0

## 2020-03-29 NOTE — Care Management (Signed)
1151 03-29-20 Case Manager spoke with patient and he is agreeable for Case Manager to call the [facility] to establish care. Appointment scheduled and placed on the AVS. Patient is aware that a pharmacy is onsite. No further needs from Case Manager at this time. Gala Lewandowsky, RN,BSN Case Manager

## 2020-03-29 NOTE — Consult Note (Signed)
[facility]    Date of Admission:  03/24/2020     Current antibiotics: Day 5 Cefepime Day 5 Metronidazole  Previous antibiotics: Vancomycin 12/10-12/11  Reason for Consult: Gram negative bacteremia     Referring Physician: Dr Rito Ehrlich  ASSESSMENT:    #E. coli and Pseudomonas aeruginosa bacteremia Secondary to acute colitis of unclear etiology.  Patient has responded very well to empiric antimicrobial therapy and is anxious to discharge home today.  Antibiotic susceptibilities of come back this morning which will enable transitioning to an oral regimen to complete his therapy.  #Acute kidney injury Secondary to above and normalized this morning.  #Transaminitis Most likely secondary to above with overall improvement and unremarkable hepatitis panel.   PLAN:    --Transition to oral ciprofloxacin and Flagyl to complete antibiotic course.   --Recommend 7 days total with end of therapy 03/31/2020 and discharged home today assuming otherwise stable from medicine perspective.  --Will sign off, please call as needed.    MEDICATIONS:    Scheduled Meds: . aspirin EC  81 mg Oral Daily  . enoxaparin (LOVENOX) injection  40 mg Subcutaneous Q24H  . lisinopril  10 mg Oral Daily  . metroNIDAZOLE  500 mg Oral Q8H    Continuous Infusions: . ceFEPime (MAXIPIME) IV 2 g (03/29/20 0631)    PRN Meds: acetaminophen **OR** acetaminophen, LORazepam, ondansetron **OR** ondansetron (ZOFRAN) IV  HPI:    Perry Zamora is a 59 y.o. male with past medical history of hypertension who presented December 11 with acute onset of headache and watery diarrhea associated with chills and abdominal bloating.  On presentation he was found to be septic with blood pressure 80/50, tachycardic, and febrile.  He also had lactic acidosis, acute kidney injury, and mild transaminitis.  CT angiography of the chest, abdomen, pelvis on admission showed diffuse circumferential wall  thickening of the sigmoid colon consistent with infectious or inflammatory colitis.  Furthermore he had a small hyperattenuating nodule in the pancreatic head measuring approximately 7 mm as well as a 4 mm pulmonary nodule that was felt to be low risk.  Follow-up MRI of the abdomen showed no pancreatic mass or other significant abnormality.  It was recommended to consider a follow-up CT of the abdomen with and without contrast in 6 months to assess for stability given the prior CT findings.  He was started on broad-spectrum antibiotics on admission given his presentation and the CT findings of colitis.  Blood cultures from 12/10 were polymicrobial with E. coli and Pseudomonas Aeruginosa.  GI pathogen panel was negative.  C. difficile screen was negative.  His liver enzyme testing has overall improved and his acute kidney injury has normalized today.  His MRSA nares PCR was negative and vancomycin was discontinued.  He was continued on cefepime and Flagyl pending antibiotic susceptibilities.  He lives in [place] with his wife.  Originally from [place] but in the [place] for many years. Nobody else in the family has been ill.  He denies any other sick contacts.  No new dietary habits or foods introduced to his diet.  He does report frequently ordering takeout from local restaurants but nothing unusual in the days preceding his illness.  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   Past Medical History:  Diagnosis Date  . HTN (hypertension)     Social History   Tobacco Use  . Smoking status: Never Smoker  . Smokeless tobacco: Never Used  Vaping Use  .  Vaping Use: Never used  Substance Use Topics  . Alcohol use: Not Currently    Comment: heavy until Quit 6 months ago  . Drug use: Never    History reviewed. No pertinent family history.  No Known Allergies  Review of Systems  Constitutional: Positive for chills (resolved.). Negative for fever.  HENT: Negative.   Respiratory: Negative.    Cardiovascular: Negative.   Gastrointestinal: Positive for abdominal pain (resolved.) and diarrhea (resolved.).  Genitourinary: Negative.   Musculoskeletal: Negative.   Skin: Negative.   Neurological: Negative.     All other systems reviewed and are negative.  OBJECTIVE:   Blood pressure (!) 133/102, pulse 69, temperature 98.4 F (36.9 C), temperature source Oral, resp. rate 18, height 5\' 3"  (1.6 m), weight 79.5 kg, SpO2 99 %. Body mass index is 31.05 kg/m.  Physical Exam Constitutional:      General: He is not in acute distress.    Appearance: Normal appearance.  HENT:     Head: Normocephalic and atraumatic.  Cardiovascular:     Rate and Rhythm: Normal rate and regular rhythm.  Pulmonary:     Effort: Pulmonary effort is normal. No respiratory distress.     Breath sounds: Normal breath sounds.  Abdominal:     General: Bowel sounds are normal. There is no distension.     Palpations: Abdomen is soft.     Tenderness: There is no abdominal tenderness. There is no guarding or rebound.  Skin:    General: Skin is warm and dry.  Neurological:     General: No focal deficit present.     Mental Status: He is alert and oriented to person, place, and time.  Psychiatric:        Mood and Affect: Mood normal.        Behavior: Behavior normal.       Lab Results & Microbiology Lab Results  Component Value Date   WBC 6.2 03/29/2020   HGB 13.1 03/29/2020   HCT 38.7 (L) 03/29/2020   MCV 94.9 03/29/2020   PLT 275 03/29/2020    Lab Results  Component Value Date   NA 135 03/29/2020   K 4.0 03/29/2020   CO2 24 03/29/2020   GLUCOSE 115 (H) 03/29/2020   BUN 14 03/29/2020   CREATININE 0.85 03/29/2020   CALCIUM 9.2 03/29/2020   GFRNONAA >60 03/29/2020    Lab Results  Component Value Date   ALT 134 (H) 03/29/2020   AST 63 (H) 03/29/2020   ALKPHOS 59 03/29/2020   BILITOT 0.6 03/29/2020    C-Reactive Protein  No results found for: CRP  Erythrocyte Sedimentation Rate  No  results found for: ESRSEDRATE    I have reviewed the micro and lab results in Epic.  Imaging No results found.       03/31/2020 for Infectious Disease [facility] [phone] pager 03/29/2020, 10:19 AM   I have spent a total of 80 minutes with the patient reviewing hospital notes,  test results, labs and examining the patient as well as establishing an assessment and plan.

## 2020-03-29 NOTE — Discharge Summary (Signed)
[facility]  Physician Discharge Summary   Patient ID: Perry Zamora MRN: [id number] DOB/AGE: 1960-11-27 59 y.o.  Admit date: 03/24/2020 Discharge date: 03/29/2020  PCP: Patient, No Pcp Per  DISCHARGE DIAGNOSES:  Acute colitis with severe sepsis and septic shock secondary to E. coli and Pseudomonas Gram-negative bacteremia with E. coli and Pseudomonas Acute kidney injury, resolved Transaminitis, improving Elevated troponin likely due to sepsis/demand ischemia Essential hypertension Pulmonary nodule Normocytic anemia  RECOMMENDATIONS FOR OUTPATIENT FOLLOW UP: 1. Will need LFTs to be checked in 1 to 2 weeks to make sure they have returned to normal 2. CT scan of the lungs in 1 year.  Discussed with the patient 3. CT scan of abdomen pelvis to be repeated in 6 months to ensure stability of the pancreas.  See below. 4. Outpatient referral sent to cardiology    Home Health: None Equipment/Devices: None  CODE STATUS: Full code  DISCHARGE CONDITION: fair  Diet recommendation: Low-sodium  INITIAL HISTORY: 59 y.o.malewith medical history significant ofHTN on just lisinopril.  Has not followed up with his primary care provider in many years.  Presented with acute diarrhea feeling poorly.  Noted to be hypotensive in the emergency department.  Was also tachycardic and diaphoretic.  Had a fever.  Was given IV fluid bolus.  Underwent CT scan which raised concern for colitis.  He was hospitalized for further management.  Consultations:  Infectious disease  Procedures:  None   HOSPITAL COURSE:   Acute colitis with severe sepsis with septic shock/gram-negative bacteremia Patient found to have evidence for colitis on CT scan.  Elevated lactic acid level at 5.6 and was noted to be hypotensive and tachycardic.  Procalcitonin level noted to be extremely elevated at 52.  Surprisingly patient did not have any leukocytosis however.  Cortisol level 21.4.  C. difficile testing  negative.  GI pathogen panel is negative.   He was aggressively hydrated.  Improvement in lactic acid level noted.    Procalcitonin improved from 52-9.3.  WBC remains normal.  He was initially treated with vancomycin cefepime and metronidazole.  Vancomycin was subsequently discontinued.  MRSA PCR was negative. Cefepime was initially changed to Cipro however blood cultures were subsequently reported as growing gram-negative bacteria.  Placed back on cefepime.   Blood cultures are growing E. coli and Pseudomonas.  Final sensitivities available.  Discussed with ID.  They have seen the patient.  Recommend Cipro and Flagyl to be continued to complete a 7-day course.  Patient remained stable.  Has been afebrile for the last several days.  Asymptomatic.  Ready to go home.  Acute kidney injury/hyponatremia/hypokalemia Creatinine 1.98 at presentation.  Resolved with IV hydration.  Sodium is now normal.  Potassium is better as well  Transaminitis Most likely due to sepsis.  Abdomen remains benign.  Hepatitis panel as noted to be unremarkable.  LFTs are improving daily.    Recheck in the outpatient setting.  Elevated troponin Patient denied any chest pain.  Initial troponin was 1266, then 1390.  EKG did not show any acute ischemic changes.  Patient denies any history of heart disease.   Echocardiogram shows normal systolic function.  No wall motion abnormalities noted.  Presentation likely due to demand ischemia as the patient was quite hypotensive and tachycardic at presentation.   Patient started on aspirin.  His troponin level is noted to be 160 on 12/13.  Do not anticipate any further inpatient work-up.  He will need to be referred to cardiology in the outpatient setting.  Message sent to cardiology for same   Essential hypertension Okay to resume ACE inhibitor.  Will discontinue the HCTZ portion of it for now.  Outpatient monitoring.  Abnormal-appearing pancreas on CT scan Incidentally noted on  CT scan.  MRI was done which does not show any pancreatic lesions.  Communicated to the patient.    CT scan abdomen pelvis in 6 months to ensure stability.  Pulmonary nodule He denies being a smoker.  May not need further work-up or repeat CT in 1 year could be considered.  This was communicated to the patient.  He will need to establish with primary care provider.  Normocytic anemia No evidence of overt bleeding.  Drop in hemoglobin is likely dilutional.  Hemoglobin remained stable.  Obesity Estimated body mass index is 31.09 kg/m as calculated from the following:   Height as of this encounter: 5\' 3"  (1.6 m).   Weight as of this encounter: 79.6 kg.   Overall stable.  Patient looking forward to going home today.  Okay for discharge.   PERTINENT LABS:  The results of significant diagnostics from this hospitalization (including imaging, microbiology, ancillary and laboratory) are listed below for reference.    Microbiology: Recent Results (from the past 240 hour(s))  Blood culture (routine x 2)     Status: None   Collection Time: 03/24/20  9:36 PM   Specimen: BLOOD  Result Value Ref Range Status   Specimen Description BLOOD RIGHT ANTECUBITAL  Final   Special Requests   Final    BOTTLES DRAWN AEROBIC AND ANAEROBIC Blood Culture results may not be optimal due to an excessive volume of blood received in culture bottles   Culture   Final    NO GROWTH 5 DAYS Performed at [facility], [address] [place], [place] [address]    Report Status 03/29/2020 FINAL  Final  Blood culture (routine x 2)     Status: Abnormal   Collection Time: 03/24/20  9:37 PM   Specimen: BLOOD  Result Value Ref Range Status   Specimen Description BLOOD BLOOD LEFT FOREARM  Final   Special Requests   Final    BOTTLES DRAWN AEROBIC AND ANAEROBIC Blood Culture results may not be optimal due to an excessive volume of blood received in culture bottles   Culture  Setup Time   Final    GRAM NEGATIVE  RODS ANAEROBIC BOTTLE ONLY Organism ID to follow CRITICAL RESULT CALLED TO, READ BACK BY AND VERIFIED WITH: PHARMD A MEYER [phone] [phone] Performed at [facility], [address] [place], [place] [address]    Culture ESCHERICHIA COLI PSEUDOMONAS AERUGINOSA  (A)  Final   Report Status 03/29/2020 FINAL  Final   Organism ID, Bacteria ESCHERICHIA COLI  Final   Organism ID, Bacteria PSEUDOMONAS AERUGINOSA  Final      Susceptibility   Escherichia coli - MIC*    AMPICILLIN <=2 SENSITIVE Sensitive     CEFAZOLIN <=4 SENSITIVE Sensitive     CEFEPIME <=0.12 SENSITIVE Sensitive     CEFTAZIDIME <=1 SENSITIVE Sensitive     CEFTRIAXONE <=0.25 SENSITIVE Sensitive     CIPROFLOXACIN <=0.25 SENSITIVE Sensitive     GENTAMICIN <=1 SENSITIVE Sensitive     IMIPENEM <=0.25 SENSITIVE Sensitive     TRIMETH/SULFA <=20 SENSITIVE Sensitive     AMPICILLIN/SULBACTAM <=2 SENSITIVE Sensitive     PIP/TAZO <=4 SENSITIVE Sensitive     * ESCHERICHIA COLI   Pseudomonas aeruginosa - MIC*    CEFTAZIDIME 4  SENSITIVE Sensitive     CIPROFLOXACIN <=0.25 SENSITIVE Sensitive     GENTAMICIN <=1 SENSITIVE Sensitive     IMIPENEM <=0.25 SENSITIVE Sensitive     PIP/TAZO <=4 SENSITIVE Sensitive     CEFEPIME 2 SENSITIVE Sensitive     * PSEUDOMONAS AERUGINOSA  Blood Culture ID Panel (Reflexed)     Status: Abnormal   Collection Time: 03/24/20  9:37 PM  Result Value Ref Range Status   Enterococcus faecalis NOT DETECTED NOT DETECTED Final   Enterococcus Faecium NOT DETECTED NOT DETECTED Final   Listeria monocytogenes NOT DETECTED NOT DETECTED Final   Staphylococcus species NOT DETECTED NOT DETECTED Final   Staphylococcus aureus (BCID) NOT DETECTED NOT DETECTED Final   Staphylococcus epidermidis NOT DETECTED NOT DETECTED Final   Staphylococcus lugdunensis NOT DETECTED NOT DETECTED Final   Streptococcus species NOT DETECTED NOT DETECTED Final   Streptococcus agalactiae NOT DETECTED NOT DETECTED Final   Streptococcus  pneumoniae NOT DETECTED NOT DETECTED Final   Streptococcus pyogenes NOT DETECTED NOT DETECTED Final   A.calcoaceticus-baumannii NOT DETECTED NOT DETECTED Final   Bacteroides fragilis NOT DETECTED NOT DETECTED Final   Enterobacterales DETECTED (A) NOT DETECTED Final    Comment: Enterobacterales represent a large order of gram negative bacteria, not a single organism. CRITICAL RESULT CALLED TO, READ BACK BY AND VERIFIED WITH: PHARMD A MEYER [phone] MLM    Enterobacter cloacae complex NOT DETECTED NOT DETECTED Final   Escherichia coli DETECTED (A) NOT DETECTED Final    Comment: CRITICAL RESULT CALLED TO, READ BACK BY AND VERIFIED WITH: PHARMD A MEYER [phone] MLM    Klebsiella aerogenes NOT DETECTED NOT DETECTED Final   Klebsiella oxytoca NOT DETECTED NOT DETECTED Final   Klebsiella pneumoniae NOT DETECTED NOT DETECTED Final   Proteus species NOT DETECTED NOT DETECTED Final   Salmonella species NOT DETECTED NOT DETECTED Final   Serratia marcescens NOT DETECTED NOT DETECTED Final   Haemophilus influenzae NOT DETECTED NOT DETECTED Final   Neisseria meningitidis NOT DETECTED NOT DETECTED Final   Pseudomonas aeruginosa DETECTED (A) NOT DETECTED Final    Comment: CRITICAL RESULT CALLED TO, READ BACK BY AND VERIFIED WITH: PHARMD A MEYER [phone] MLM    Stenotrophomonas maltophilia NOT DETECTED NOT DETECTED Final   Candida albicans NOT DETECTED NOT DETECTED Final   Candida auris NOT DETECTED NOT DETECTED Final   Candida glabrata NOT DETECTED NOT DETECTED Final   Candida krusei NOT DETECTED NOT DETECTED Final   Candida parapsilosis NOT DETECTED NOT DETECTED Final   Candida tropicalis NOT DETECTED NOT DETECTED Final   Cryptococcus neoformans/gattii NOT DETECTED NOT DETECTED Final   CTX-M ESBL NOT DETECTED NOT DETECTED Final   Carbapenem resistance IMP NOT DETECTED NOT DETECTED Final   Carbapenem resistance KPC NOT DETECTED NOT DETECTED Final   Carbapenem resistance NDM NOT DETECTED  NOT DETECTED Final   Carbapenem resist OXA 48 LIKE NOT DETECTED NOT DETECTED Final   Carbapenem resistance VIM NOT DETECTED NOT DETECTED Final    Comment: Performed at [facility], [address], [place], [place] [address]  Culture, Urine     Status: None   Collection Time: 03/25/20 12:00 AM   Specimen: Urine, Random  Result Value Ref Range Status   Specimen Description URINE, RANDOM  Final   Special Requests NONE  Final   Culture   Final    NO GROWTH Performed at [facility], [address] [place], [place] [address]    Report Status 03/26/2020  FINAL  Final  Resp Panel by RT-PCR (Flu A&B, Covid) Nasopharyngeal Swab     Status: None   Collection Time: 03/25/20 12:14 AM   Specimen: Nasopharyngeal Swab; Nasopharyngeal(NP) swabs in vial transport medium  Result Value Ref Range Status   SARS Coronavirus 2 by RT PCR NEGATIVE NEGATIVE Final    Comment: (NOTE) SARS-CoV-2 target nucleic acids are NOT DETECTED.  The SARS-CoV-2 RNA is generally detectable in upper respiratory specimens during the acute phase of infection. The lowest concentration of SARS-CoV-2 viral copies this assay can detect is 138 copies/mL. A negative result does not preclude SARS-Cov-2 infection and should not be used as the sole basis for treatment or other patient management decisions. A negative result may occur with  improper specimen collection/handling, submission of specimen other than nasopharyngeal swab, presence of viral mutation(s) within the areas targeted by this assay, and inadequate number of viral copies(<138 copies/mL). A negative result must be combined with clinical observations, patient history, and epidemiological information. The expected result is Negative.  Fact Sheet for Patients:  BloggerCourse.com  Fact Sheet for Healthcare Providers:  SeriousBroker.it  This test is no t yet approved or cleared by the [place] FDA  and  has been authorized for detection and/or diagnosis of SARS-CoV-2 by FDA under an Emergency Use Authorization (EUA). This EUA will remain  in effect (meaning this test can be used) for the duration of the COVID-19 declaration under Section 564(b)(1) of the Act, 21 U.S.C.section 360bbb-3(b)(1), unless the authorization is terminated  or revoked sooner.       Influenza A by PCR NEGATIVE NEGATIVE Final   Influenza B by PCR NEGATIVE NEGATIVE Final    Comment: (NOTE) The Xpert Xpress SARS-CoV-2/FLU/RSV plus assay is intended as an aid in the diagnosis of influenza from Nasopharyngeal swab specimens and should not be used as a sole basis for treatment. Nasal washings and aspirates are unacceptable for Xpert Xpress SARS-CoV-2/FLU/RSV testing.  Fact Sheet for Patients: BloggerCourse.com  Fact Sheet for Healthcare Providers: SeriousBroker.it  This test is not yet approved or cleared by the [place] FDA and has been authorized for detection and/or diagnosis of SARS-CoV-2 by FDA under an Emergency Use Authorization (EUA). This EUA will remain in effect (meaning this test can be used) for the duration of the COVID-19 declaration under Section 564(b)(1) of the Act, 21 U.S.C. section 360bbb-3(b)(1), unless the authorization is terminated or revoked.  Performed at [facility], [address], [place], [place] [address]   Gastrointestinal Panel by PCR , Stool     Status: None   Collection Time: 03/25/20  1:35 AM   Specimen: STOOL  Result Value Ref Range Status   Campylobacter species NOT DETECTED NOT DETECTED Final   Plesimonas shigelloides NOT DETECTED NOT DETECTED Final   Salmonella species NOT DETECTED NOT DETECTED Final   Yersinia enterocolitica NOT DETECTED NOT DETECTED Final   Vibrio species NOT DETECTED NOT DETECTED Final   Vibrio cholerae NOT DETECTED NOT DETECTED Final   Enteroaggregative E coli (EAEC) NOT  DETECTED NOT DETECTED Final   Enteropathogenic E coli (EPEC) NOT DETECTED NOT DETECTED Final   Enterotoxigenic E coli (ETEC) NOT DETECTED NOT DETECTED Final   Shiga like toxin producing E coli (STEC) NOT DETECTED NOT DETECTED Final   Shigella/Enteroinvasive E coli (EIEC) NOT DETECTED NOT DETECTED Final   Cryptosporidium NOT DETECTED NOT DETECTED Final   Cyclospora cayetanensis NOT DETECTED NOT DETECTED Final   Entamoeba histolytica NOT DETECTED NOT DETECTED Final  Giardia lamblia NOT DETECTED NOT DETECTED Final   Adenovirus F40/41 NOT DETECTED NOT DETECTED Final   Astrovirus NOT DETECTED NOT DETECTED Final   Norovirus GI/GII NOT DETECTED NOT DETECTED Final   Rotavirus A NOT DETECTED NOT DETECTED Final   Sapovirus (I, II, IV, and V) NOT DETECTED NOT DETECTED Final    Comment: Performed at [facility], [address] [place], [place] [address]  C Difficile Quick Screen w PCR reflex     Status: None   Collection Time: 03/25/20  1:35 AM   Specimen: STOOL  Result Value Ref Range Status   C Diff antigen NEGATIVE NEGATIVE Final   C Diff toxin NEGATIVE NEGATIVE Final   C Diff interpretation No C. difficile detected.  Final    Comment: Performed at [facility], [address] [place], [place] [address]  MRSA PCR Screening     Status: None   Collection Time: 03/25/20  5:06 AM   Specimen: Nasal Mucosa; Nasopharyngeal  Result Value Ref Range Status   MRSA by PCR NEGATIVE NEGATIVE Final    Comment:        The GeneXpert MRSA Assay (FDA approved for NASAL specimens only), is one component of a comprehensive MRSA colonization surveillance program. It is not intended to diagnose MRSA infection nor to guide or monitor treatment for MRSA infections. Performed at [facility], [address], [place], [place] [address]      Labs:  COVID-19 Labs   Lab Results  Component Value Date   SARSCOV2NAA NEGATIVE 03/25/2020   SARSCOV2NAA Not Detected 07/01/2018       Basic Metabolic Panel: Recent Labs  Lab 03/25/20 0406 03/26/20 0545 03/27/20 0628 03/28/20 0253 03/29/20 0214  NA 131* 134* 137 138 135  K 3.7 3.8 3.5 4.0 4.0  CL 102 105 104 106 103  CO2 21* 19* GLUCOSE 120* 81 108* 108* 115*  BUN CREATININE 1.60* 1.08 0.93 0.99 0.85  CALCIUM 8.6* 8.4* 8.8* 8.9 9.2  MG  --  1.8  --   --   --    Liver Function Tests: Recent Labs  Lab 03/25/20 0406 03/26/20 0545 03/27/20 0628 03/28/20 0253 03/29/20 0214  AST 272* 311* 129* 90* 63*  ALT 162* 348* 228* 174* 134*  ALKPHOS 43 41 56 55 59  BILITOT 0.4 0.8 0.7 0.6 0.6  PROT 6.8 6.1* 6.2* 6.2* 6.4*  ALBUMIN 3.2* 2.9* 3.0* 2.9* 3.2*   CBC: Recent Labs  Lab 03/24/20 2116 03/24/20 2247 03/25/20 0406 03/26/20 0545 03/27/20 0628 03/28/20 0253 03/29/20 0214  WBC 5.6  --  8.6 6.7 5.0 5.2 6.2  NEUTROABS 4.1  --   --   --   --   --   --   HGB 16.2   < > 13.1 11.9* 12.3* 12.3* 13.1  HCT 46.4   < > 37.3* 35.5* 35.4* 37.3* 38.7*  MCV 94.3  --  94.9 95.9 94.4 96.1 94.9  PLT 189  --  156 163 192 225 275   < > = values in this interval not displayed.     IMAGING STUDIES MR ABDOMEN W WO CONTRAST  Result Date: 03/26/2020 CLINICAL DATA:  Characterize pancreatic lesion EXAM: MRI ABDOMEN WITHOUT AND WITH CONTRAST TECHNIQUE: Multiplanar multisequence MR imaging of the abdomen was performed both before and after the administration of intravenous contrast. CONTRAST:  8mL GADAVIST GADOBUTROL 1 MMOL/ML IV SOLN COMPARISON:  CT chest abdomen  pelvis, 03/24/2020 FINDINGS: Lower chest: No acute findings. Hepatobiliary: Hepatic steatosis. No mass or other parenchymal abnormality identified. No biliary ductal dilatation. Pancreas: There is no mass or abnormal contrast enhancement in the central pancreatic head to correlate to finding of prior CT (series 21, image 57). No other evidence of mass, inflammatory changes, or other parenchymal abnormality identified. No pancreatic ductal  dilatation. Spleen:  Within normal limits in size and appearance. Adrenals/Urinary Tract: No masses identified. No evidence of hydronephrosis. Stomach/Bowel: Colonic diverticula. Visualized portions within the abdomen are otherwise unremarkable. Vascular/Lymphatic: No pathologically enlarged lymph nodes identified. No abdominal aortic aneurysm demonstrated. Other:  None. Musculoskeletal: No suspicious bone lesions identified. IMPRESSION: 1. No pancreatic mass or other significant abnormality identified. There is no MR correlate for a small lesion in the central pancreatic head suspected to be arterially hyperenhancing on prior CT, although not included in noncontrast images of the chest performed on that examination. In particular there is no focal signal abnormality or hyperenhancement on multiphasic contrast enhanced examination, and this finding on prior CT is of uncertain significance, possibly reflecting a small focus of calcification in the pancreatic parenchyma. In the absence of any suspicious MR finding, a pancreatic neuroendocrine tumor is not favored, and there are no concerning secondary features such as lymphadenopathy or pancreatic ductal dilatation. Consider follow-up CT of the abdomen without and with contrast in 6 months to assess for stability and true contrast enhancement characteristics as lesion was originally appreciated on this modality. 2. Hepatic steatosis. Electronically Signed   By: Lauralyn Primes M.D.   On: 03/26/2020 18:16   ECHOCARDIOGRAM COMPLETE  Result Date: 03/25/2020    ECHOCARDIOGRAM REPORT   Patient Name:   NAVEEN CLARDY Date of Exam: 03/25/2020 Medical Rec #:  [id number]     Height:       63.0 in Accession #:    [id number]    Weight:       174.4 lb Date of Birth:  1960/07/09      BSA:          1.824 m Patient Age:    59 years      BP:           91/68 mmHg Patient Gender: M             HR:           60 bpm. Exam Location:  Inpatient Procedure: 2D Echo, Cardiac Doppler and Color  Doppler Indications:    Elevated troponin  History:        Patient has no prior history of Echocardiogram examinations.                 Risk Factors:Hypertension. Sepsis, colitis.  Sonographer:    Lavenia Atlas Referring Phys: [phone] Osvaldo Shipper IMPRESSIONS  1. Left ventricular ejection fraction, by estimation, is 55 to 60%. The left ventricle has normal function. The left ventricle has no regional wall motion abnormalities. Left ventricular diastolic parameters were normal.  2. Right ventricular systolic function is normal. The right ventricular size is mildly enlarged.  3. The mitral valve is normal in structure. Trivial mitral valve regurgitation. No evidence of mitral stenosis.  4. The aortic valve is tricuspid. Aortic valve regurgitation is not visualized. No aortic stenosis is present.  5. The inferior vena cava is normal in size with greater than 50% respiratory variability, suggesting right atrial pressure of 3 mmHg. FINDINGS  Left Ventricle: Left ventricular ejection fraction, by estimation, is 55 to 60%. The left  ventricle has normal function. The left ventricle has no regional wall motion abnormalities. The left ventricular internal cavity size was normal in size. There is  no left ventricular hypertrophy. Left ventricular diastolic parameters were normal. Right Ventricle: The right ventricular size is mildly enlarged. No increase in right ventricular wall thickness. Right ventricular systolic function is normal. Left Atrium: Left atrial size was normal in size. Right Atrium: Right atrial size was normal in size. Pericardium: There is no evidence of pericardial effusion. Mitral Valve: The mitral valve is normal in structure. Trivial mitral valve regurgitation. No evidence of mitral valve stenosis. Tricuspid Valve: The tricuspid valve is normal in structure. Tricuspid valve regurgitation is not demonstrated. No evidence of tricuspid stenosis. Aortic Valve: The aortic valve is tricuspid. Aortic valve  regurgitation is not visualized. No aortic stenosis is present. Pulmonic Valve: The pulmonic valve was normal in structure. Pulmonic valve regurgitation is not visualized. No evidence of pulmonic stenosis. Aorta: The aortic root is normal in size and structure. Venous: The inferior vena cava is normal in size with greater than 50% respiratory variability, suggesting right atrial pressure of 3 mmHg. IAS/Shunts: No atrial level shunt detected by color flow Doppler.  LEFT VENTRICLE PLAX 2D LVIDd:         5.10 cm  Diastology LVIDs:         3.00 cm  LV e' medial:    9.46 cm/s LV PW:         1.10 cm  LV E/e' medial:  5.7 LV IVS:        1.00 cm  LV e' lateral:   12.30 cm/s LVOT diam:     2.00 cm  LV E/e' lateral: 4.4 LV SV:         51 LV SV Index:   28 LVOT Area:     3.14 cm  RIGHT VENTRICLE RV Basal diam:  3.30 cm RV S prime:     5.87 cm/s TAPSE (M-mode): 2.5 cm LEFT ATRIUM             Index       RIGHT ATRIUM           Index LA diam:        3.40 cm 1.86 cm/m  RA Area:     12.40 cm LA Vol (A2C):   47.0 ml 25.76 ml/m RA Volume:   30.50 ml  16.72 ml/m LA Vol (A4C):   33.8 ml 18.53 ml/m LA Biplane Vol: 43.9 ml 24.06 ml/m  AORTIC VALVE LVOT Vmax:   75.70 cm/s LVOT Vmean:  53.200 cm/s LVOT VTI:    0.161 m  AORTA Ao Root diam: 2.80 cm MITRAL VALVE MV Area (PHT): 3.17 cm    SHUNTS MV Decel Time: 239 msec    Systemic VTI:  0.16 m MV E velocity: 53.60 cm/s  Systemic Diam: 2.00 cm MV A velocity: 39.40 cm/s MV E/A ratio:  1.36 Charlton Haws MD Electronically signed by Charlton Haws MD Signature Date/Time: 03/25/2020/2:37:29 PM    Final    CT Angio Chest/Abd/Pel for Dissection W and/or Wo Contrast  Result Date: 03/24/2020 CLINICAL DATA:  Abdominal pain.  Concern for aortic dissection. EXAM: CT ANGIOGRAPHY CHEST, ABDOMEN AND PELVIS TECHNIQUE: Non-contrast CT of the chest was initially obtained. Multidetector CT imaging through the chest, abdomen and pelvis was performed using the standard protocol during bolus  administration of intravenous contrast. Multiplanar reconstructed images and MIPs were obtained and reviewed to evaluate the vascular anatomy. CONTRAST:  OMNIPAQUE  IOHEXOL 350 MG/ML SOLN COMPARISON:  None. FINDINGS: CTA CHEST FINDINGS Cardiovascular: There is no evidence for thoracic aortic dissection or aneurysm. The heart size is normal. There is no large pulmonary embolism. Mediastinum/Nodes: -- No mediastinal lymphadenopathy. -- No hilar lymphadenopathy. -- No axillary lymphadenopathy. -- No supraclavicular lymphadenopathy. -- Normal thyroid gland where visualized. -  Unremarkable esophagus. Lungs/Pleura: There are two 4 mm pulmonary nodules in the left lower lobe (axial series 8, image 89). There is no pneumothorax. No large pleural effusion. No focal infiltrate. The trachea is unremarkable. Musculoskeletal: No chest wall abnormality. No bony spinal canal stenosis. Review of the MIP images confirms the above findings. CTA ABDOMEN AND PELVIS FINDINGS VASCULAR Aorta: Normal caliber aorta without aneurysm, dissection, vasculitis or significant stenosis. Celiac: Patent without evidence of aneurysm, dissection, vasculitis or significant stenosis. SMA: Patent without evidence of aneurysm, dissection, vasculitis or significant stenosis. Renals: Both renal arteries are patent without evidence of aneurysm, dissection, vasculitis, fibromuscular dysplasia or significant stenosis. IMA: Patent without evidence of aneurysm, dissection, vasculitis or significant stenosis. Inflow: Patent without evidence of aneurysm, dissection, vasculitis or significant stenosis. Veins: No obvious venous abnormality within the limitations of this arterial phase study. Review of the MIP images confirms the above findings. NON-VASCULAR Hepatobiliary: The liver is normal. Normal gallbladder.There is no biliary ductal dilation. Pancreas: There is a small hyperattenuating nodule in the pancreatic head measuring approximately 7 mm (series 6,  image 156). Spleen: Unremarkable. Adrenals/Urinary Tract: --Adrenal glands: Unremarkable. --Right kidney/ureter: No hydronephrosis or radiopaque kidney stones. --Left kidney/ureter: No hydronephrosis or radiopaque kidney stones. --Urinary bladder: Unremarkable. Stomach/Bowel: --Stomach/Duodenum: No hiatal hernia or other gastric abnormality. Normal duodenal course and caliber. --Small bowel: Unremarkable. --Colon: There is diffuse circumferential wall thickening the sigmoid colon. There is scattered colonic diverticula. There is liquid stool throughout the. --Appendix: Normal. Lymphatic: --No retroperitoneal lymphadenopathy. --No mesenteric lymphadenopathy. --No pelvic or inguinal lymphadenopathy. Reproductive: The prostate gland is enlarged. Other: No ascites or free air. The abdominal wall is normal. Musculoskeletal. No acute displaced fractures. Review of the MIP images confirms the above findings. IMPRESSION: 1. No evidence for aortic dissection or aneurysm. 2. Diffuse circumferential wall thickening the sigmoid colon, consistent with infectious or inflammatory colitis. 3. There is a small hyperattenuating nodule in the pancreatic head measuring approximately 7 mm. Further evaluation with a contrast-enhanced outpatient MRI is recommended as this could represent a neuroendocrine tumor. 4. Enlarged prostate gland. 5. There is a 4 mm pulmonary nodule in the left lower lobe. No follow-up needed if patient is low-risk. Non-contrast chest CT can be considered in 12 months if patient is high-risk. This recommendation follows the consensus statement: Guidelines for Management of Incidental Pulmonary Nodules Detected on CT Images: From the Fleischner Society 2017; Radiology 2017; 284:228-243. Aortic Atherosclerosis (ICD10-I70.0). Electronically Signed   By: Katherine Mantle M.D.   On: 03/24/2020 22:00    DISCHARGE EXAMINATION: Vitals:   03/28/20 0622 03/28/20 1506 03/29/20 0513 03/29/20 0952  BP: (!) 147/95 (!)  172/95 (!) 169/97 (!) 133/102  Pulse: 67  69   Resp: 16  18   Temp: 97.7 F (36.5 C) 98.5 F (36.9 C) 98.4 F (36.9 C)   TempSrc: Oral Oral Oral   SpO2: 96% 96% 99%   Weight: 79.6 kg  79.5 kg   Height:       General appearance: Awake alert.  In no distress Resp: Clear to auscultation bilaterally.  Normal effort Cardio: S1-S2 is normal regular.  No S3-S4.  No rubs murmurs or bruit GI:  Abdomen is soft.  Nontender nondistended.  Bowel sounds are present normal.  No masses organomegaly    DISPOSITION: Home  Discharge Instructions    Call MD for:  difficulty breathing, headache or visual disturbances   Complete by: As directed    Call MD for:  extreme fatigue   Complete by: As directed    Call MD for:  persistant dizziness or light-headedness   Complete by: As directed    Call MD for:  persistant nausea and vomiting   Complete by: As directed    Call MD for:  severe uncontrolled pain   Complete by: As directed    Call MD for:  temperature >100.4   Complete by: As directed    Diet - low sodium heart healthy   Complete by: As directed    Discharge instructions   Complete by: As directed    Please take your medications as prescribed.  Message will be sent to cardiology office to try and arrange outpatient follow-up for you.  Seek attention immediately if your symptoms were to recur or if you develop chest pain or shortness of breath.  We will also try to set you up to see [facility]. A lung nodule was noted on CT scan as was discussed with you few days ago.  You may need a repeat CT scan in 1 year. You should also talk to your doctor about repeating CT scan of your abdomen and pelvis in 6 months time to make sure everything is stable.  You were cared for by a hospitalist during your hospital stay. If you have any questions about your discharge medications or the care you received while you were in the hospital after you are discharged, you  can call the unit and asked to speak with the hospitalist on call if the hospitalist that took care of you is not available. Once you are discharged, your primary care physician will handle any further medical issues. Please note that NO REFILLS for any discharge medications will be authorized once you are discharged, as it is imperative that you return to your primary care physician (or establish a relationship with a primary care physician if you do not have one) for your aftercare needs so that they can reassess your need for medications and monitor your lab values. If you do not have a primary care physician, you can call [phone] for a physician referral.   Increase activity slowly   Complete by: As directed         Allergies as of 03/29/2020   No Known Allergies     Medication List    STOP taking these medications   lisinopril-hydrochlorothiazide 10-12.5 MG tablet Commonly known as: ZESTORETIC     TAKE these medications   aspirin 81 MG EC tablet Take 1 tablet (81 mg total) by mouth daily. Swallow whole. Start taking on: March 30, 2020   ciprofloxacin 750 MG tablet Commonly known as: CIPRO Take 1 tablet (750 mg total) by mouth 2 (two) times daily for 5 doses. Take 1 dose on 12/15 evening and the twice a day for 2 days   hydroxypropyl methylcellulose / hypromellose 2.5 % ophthalmic solution Commonly known as: ISOPTO TEARS / GONIOVISC Place 1 drop into both eyes 4 (four) times daily as needed for dry eyes.   lisinopril 10 MG tablet Commonly known as: ZESTRIL Take 1 tablet (10 mg total) by mouth daily. Start taking on: March 30, 2020  metroNIDAZOLE 500 MG tablet Commonly known as: FLAGYL Take 1 tablet (500 mg total) by mouth 3 (three) times daily for 7 doses.         Follow-up Information    Anders Simmonds, PA-C Follow up on 04/26/2020.   Specialty: Family Medicine Why: @ 8:30 am for hospital follow up appointment. Onsite pharmacy and cost ranges from  $4.00-$10.00.  Contact information: [address] [place] [address] [phone]               TOTAL DISCHARGE TIME: 35 minutes  Aamina Skiff Rito Ehrlich  [facility] Pager on [email or web address]  03/29/2020, 12:00 PM

## 2020-04-18 NOTE — Progress Notes (Signed)
Perry Zamora, is a 60 y.o. male  TDV:[id number]  GGY:[id number]  DOB - 1960-05-27  Subjective:  Chief Complaint and HPI: Perry Zamora is a 60 y.o. male here today to establish care and for a follow up visit After hospitalization from 12/10-12/15/2021 for colitis and sepsis.  He is feeling great.  Appetite is good.  Bowels moving normally.  No fever.  Says BP at home are 115-120/78-82.    From discharge summary: DISCHARGE DIAGNOSES:  Acute colitis with severe sepsis and septic shock secondary to E. coli and Pseudomonas Gram-negative bacteremia with E. coli and Pseudomonas Acute kidney injury, resolved Transaminitis, improving Elevated troponin likely due to sepsis/demand ischemia Essential hypertension Pulmonary nodule Normocytic anemia  RECOMMENDATIONS FOR OUTPATIENT FOLLOW UP: 1. Will need LFTs to be checked in 1 to 2 weeks to make sure they have returned to normal 2. CT scan of the lungs in 1 year.  Discussed with the patient 3. CT scan of abdomen pelvis to be repeated in 6 months to ensure stability of the pancreas.  See below. 4. Outpatient referral sent to cardiology   HOSPITAL COURSE:   Acute colitis with severe sepsis with septic shock/gram-negative bacteremia Patient found to have evidence for colitis on CT scan. Elevated lactic acid level at 5.6 and was noted to be hypotensive and tachycardic. Procalcitonin level noted to be extremely elevated at 52. Surprisingly patient did not have any leukocytosis however. Cortisol level 21.4. C. difficile testing negative. GI pathogen panel is negative.  He was aggressively hydrated. Improvement in lactic acid level noted.   Procalcitonin improved from 52-9.3. WBC remains normal. He was initially treated with vancomycin cefepime and metronidazole. Vancomycin was subsequently discontinued. MRSA PCR was negative. Cefepime was initially changed to Cipro however blood cultures were subsequently reported as growing  gram-negative bacteria. Placed back on cefepime.  Blood cultures are growing E. coli and Pseudomonas.  Final sensitivities available.  Discussed with ID.  They have seen the patient.  Recommend Cipro and Flagyl to be continued to complete a 7-day course.  Patient remained stable.  Has been afebrile for the last several days.  Asymptomatic.  Ready to go home.  Acute kidney injury/hyponatremia/hypokalemia Creatinine 1.98 at presentation. Resolved with IV hydration. Sodium is now normal. Potassium is better as well  Transaminitis Most likely due to sepsis. Abdomen remains benign. Hepatitis panel as noted to be unremarkable. LFTs are improving daily.  Recheck in the outpatient setting.  Elevated troponin Patient denied any chest pain. Initial troponin was 1266, then 1390. EKG did not show any acute ischemic changes. Patient denies any history of heart disease.  Echocardiogram shows normal systolic function. No wall motion abnormalities noted. Presentation likely due to demand ischemia as the patient was quite hypotensive and tachycardic at presentation.  Patient started on aspirin. His troponin level is noted to be 160 on 12/13.Do not anticipate any further inpatient work-up. He will need to be referred to cardiology in the outpatient setting.  Message sent to cardiology for same  Essential hypertension Okay to resume ACE inhibitor.  Will discontinue the HCTZ portion of it for now.  Outpatient monitoring.  Abnormal-appearing pancreas on CT scan Incidentally noted on CT scan. MRI was done which does not show any pancreatic lesions. Communicated to the patient.   CT scan abdomen pelvis in 6 months to ensure stability.  Pulmonary nodule He denies being a smoker. May not need further work-up or repeat CT in 1 year could be considered.This was communicated to the patient. He  will need to establish with primary care provider.  Normocytic anemia No evidence of overt  bleeding. Drop in hemoglobin is likely dilutional. Hemoglobin remained stable.  Obesity Estimated body mass index is 31.09 kg/m as calculated from the following: Height as of this encounter: 5\' 3"  (1.6 m). Weight as of this encounter: 79.6 kg.  ED/Hospital notes reviewed and summarized above.   ROS:   Constitutional:  No f/c, No night sweats, No unexplained weight loss. EENT:  No vision changes, No blurry vision, No hearing changes. No mouth, throat, or ear problems.  Respiratory: No cough, No SOB Cardiac: No CP, no palpitations GI:  No abd pain, No N/V/D. GU: No Urinary s/sx Musculoskeletal: No joint pain Neuro: No headache, no dizziness, no motor weakness.  Skin: No rash Endocrine:  No polydipsia. No polyuria.  Psych: Denies SI/HI  No problems updated.  ALLERGIES: No Known Allergies  PAST MEDICAL HISTORY: Past Medical History:  Diagnosis Date  . HTN (hypertension)     MEDICATIONS AT HOME: Prior to Admission medications   Medication Sig Start Date End Date Taking? Authorizing Provider  aspirin 81 MG EC tablet Take 1 tablet (81 mg total) by mouth daily. Swallow whole. 04/26/20   06/24/20, PA-C  hydroxypropyl methylcellulose / hypromellose (ISOPTO TEARS / GONIOVISC) 2.5 % ophthalmic solution Place 1 drop into both eyes 4 (four) times daily as needed for dry eyes.    [provider]  lisinopril (ZESTRIL) 10 MG tablet Take 1 tablet (10 mg total) by mouth daily. 04/26/20   06/24/20, PA-C     Objective:  EXAM:   Vitals:   04/26/20 0839  BP: (!) 135/95  Pulse: 73  Resp: 16  Temp: 98.6 F (37 C)  SpO2: 96%  Weight: 178 lb 6.4 oz (80.9 kg)  Height: 5\' 3"  (1.6 m)    General appearance : A&OX3. NAD. Non-toxic-appearing HEENT: Atraumatic and Normocephalic.  PERRLA. EOM intact.  Mouth-MMM, post pharynx WNL w/o erythema, No PND. Neck: supple, no JVD. No cervical lymphadenopathy. No thyromegaly Chest/Lungs:  Breathing-non-labored, Good  air entry bilaterally, breath sounds normal without rales, rhonchi, or wheezing  CVS: S1 S2 regular, no murmurs, gallops, rubs  Abdomen: Bowel sounds present, Non tender and not distended with no gaurding, rigidity or rebound. Extremities: Bilateral Lower Ext shows no edema, both legs are warm to touch with = pulse throughout Neurology:  CN II-XII grossly intact, Non focal.   Psych:  TP linear. J/I WNL. Normal speech. Appropriate eye contact and affect.  Skin:  No Rash  Data Review Lab Results  Component Value Date   HGBA1C 5.7 (H) 03/26/2020     Assessment & Plan   1. Severe sepsis with septic shock (HCC) Resolved clinically - CBC with Differential/Platelet  2. AKI (acute kidney injury) (HCC) - Comprehensive metabolic panel  3. Pulmonary nodule 1 year - CT CHEST LUNG CA SCREEN LOW DOSE W/O CM; Future  4. Mass of pancreas Recommended in 6 months - CT Abdomen Pelvis Wo Contrast; Future  5. Elevated troponin No CP/SOB/pressure of palpitations - Ambulatory referral to Cardiology  6. Hospital discharge follow-up Doing well  7. Transaminitis - Comprehensive metabolic panel  8. Hypertension, unspecified type suboptimal here but controlled at home - Comprehensive metabolic panel - CBC with Differential/Platelet - Ambulatory referral to Cardiology - lisinopril (ZESTRIL) 10 MG tablet; Take 1 tablet (10 mg total) by mouth daily.  Dispense: 90 tablet; Refill: 1  9. Abdominal distension (gaseous) - CT Abdomen Pelvis Wo  Contrast; Future  Patient have been counseled extensively about nutrition and exercise  Return in about 3 months (around 07/25/2020) for assign PCP.  The patient was given clear instructions to go to ER or return to medical center if symptoms don't improve, worsen or new problems develop. The patient verbalized understanding. The patient was told to call to get lab results if they haven't heard anything in the next week.     Georgian Co, PA-C [facility] [place], [place] [phone]   04/26/2020, 9:08 AM

## 2020-04-19 ENCOUNTER — Encounter: Payer: Self-pay | Admitting: General Practice

## 2020-04-26 ENCOUNTER — Other Ambulatory Visit: Payer: Self-pay

## 2020-04-26 ENCOUNTER — Encounter: Payer: Self-pay | Admitting: Physician Assistant

## 2020-04-26 ENCOUNTER — Ambulatory Visit: Payer: Self-pay | Attending: Physician Assistant | Admitting: Physician Assistant

## 2020-04-26 VITALS — BP 135/95 | HR 73 | Temp 98.6°F | Resp 16 | Ht 63.0 in | Wt 178.4 lb

## 2020-04-26 DIAGNOSIS — A419 Sepsis, unspecified organism: Secondary | ICD-10-CM

## 2020-04-26 DIAGNOSIS — I10 Essential (primary) hypertension: Secondary | ICD-10-CM

## 2020-04-26 DIAGNOSIS — N179 Acute kidney failure, unspecified: Secondary | ICD-10-CM

## 2020-04-26 DIAGNOSIS — Z09 Encounter for follow-up examination after completed treatment for conditions other than malignant neoplasm: Secondary | ICD-10-CM

## 2020-04-26 DIAGNOSIS — R140 Abdominal distension (gaseous): Secondary | ICD-10-CM

## 2020-04-26 DIAGNOSIS — R778 Other specified abnormalities of plasma proteins: Secondary | ICD-10-CM

## 2020-04-26 DIAGNOSIS — K8689 Other specified diseases of pancreas: Secondary | ICD-10-CM

## 2020-04-26 DIAGNOSIS — R6521 Severe sepsis with septic shock: Secondary | ICD-10-CM

## 2020-04-26 DIAGNOSIS — R911 Solitary pulmonary nodule: Secondary | ICD-10-CM

## 2020-04-26 DIAGNOSIS — R7401 Elevation of levels of liver transaminase levels: Secondary | ICD-10-CM

## 2020-04-26 MED ORDER — LISINOPRIL 10 MG PO TABS: 10.0000 mg | ORAL_TABLET | Freq: Every day | ORAL | 1 refills | Status: DC

## 2020-04-26 MED ORDER — ASPIRIN 81 MG PO TBEC: 81.0000 mg | DELAYED_RELEASE_TABLET | Freq: Every day | ORAL | 1 refills | Status: DC

## 2020-04-27 LAB — CBC WITH DIFFERENTIAL/PLATELET
Basophils Absolute: 0 10*3/uL (ref 0.0–0.2)
Basos: 1 %
EOS (ABSOLUTE): 0.2 10*3/uL (ref 0.0–0.4)
Eos: 2 %
Hematocrit: 42.7 % (ref 37.5–51.0)
Hemoglobin: 14.6 g/dL (ref 13.0–17.7)
Immature Grans (Abs): 0 10*3/uL (ref 0.0–0.1)
Immature Granulocytes: 0 %
Lymphocytes Absolute: 2.5 10*3/uL (ref 0.7–3.1)
Lymphs: 33 %
MCH: 32.2 pg (ref 26.6–33.0)
MCHC: 34.2 g/dL (ref 31.5–35.7)
MCV: 94 fL (ref 79–97)
Monocytes Absolute: 0.5 10*3/uL (ref 0.1–0.9)
Monocytes: 6 %
Neutrophils Absolute: 4.3 10*3/uL (ref 1.4–7.0)
Neutrophils: 58 %
Platelets: 217 10*3/uL (ref 150–450)
RBC: 4.54 x10E6/uL (ref 4.14–5.80)
RDW: 12.8 % (ref 11.6–15.4)
WBC: 7.5 10*3/uL (ref 3.4–10.8)

## 2020-04-27 LAB — COMPREHENSIVE METABOLIC PANEL
ALT: 34 IU/L (ref 0–44)
AST: 27 IU/L (ref 0–40)
Albumin/Globulin Ratio: 1.6 (ref 1.2–2.2)
Albumin: 4.4 g/dL (ref 3.8–4.9)
Alkaline Phosphatase: 64 IU/L (ref 44–121)
BUN/Creatinine Ratio: 13 (ref 9–20)
BUN: 11 mg/dL (ref 6–24)
Bilirubin Total: 0.4 mg/dL (ref 0.0–1.2)
CO2: 23 mmol/L (ref 20–29)
Calcium: 10 mg/dL (ref 8.7–10.2)
Chloride: 100 mmol/L (ref 96–106)
Creatinine, Ser: 0.87 mg/dL (ref 0.76–1.27)
GFR calc Af Amer: 111 mL/min/{1.73_m2} (ref >59)
GFR calc non Af Amer: 96 mL/min/{1.73_m2} (ref >59)
Globulin, Total: 2.8 g/dL (ref 1.5–4.5)
Glucose: 89 mg/dL (ref 65–99)
Potassium: 4.5 mmol/L (ref 3.5–5.2)
Sodium: 138 mmol/L (ref 134–144)
Total Protein: 7.2 g/dL (ref 6.0–8.5)

## 2020-05-10 ENCOUNTER — Encounter: Payer: Self-pay | Admitting: General Practice

## 2021-12-23 IMAGING — MR MR ABDOMEN WO/W CM
19 of 22 series · 42 of 48 positions shown · IV contrast (gadavist)
Comparison: CT chest abdomen pelvis, 03/24/2020

CLINICAL DATA: Characterize pancreatic lesion

EXAM:
MRI ABDOMEN WITHOUT AND WITH CONTRAST
TECHNIQUE: Multiplanar multisequence MR imaging of the abdomen was performed
both before and after the administration of intravenous contrast.
CONTRAST:  8mL GADAVIST GADOBUTROL 1 MMOL/ML IV SOLN

[Series 4: cor haste · coronal · 6.0mm · 1.19mm/px · 1 of 36 slices shown]
[im 1/36]
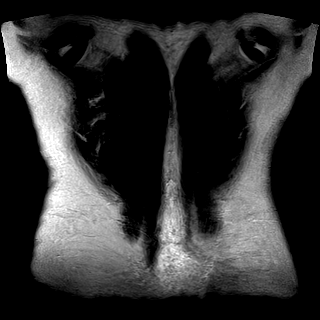

[Series 5: ax haste · axial · 6.0mm · 1.19mm/px · 1 of 36 slices shown]
[im 1/36]
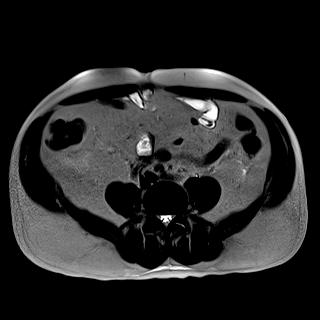

[Series 6: t1_vibe_opp-in_tra_p4_bh · axial · 3.0mm · 1.19mm/px · z∈[-239,-2]mm · 2 of 80 slices shown (1 of 2)]
[im 1/80]
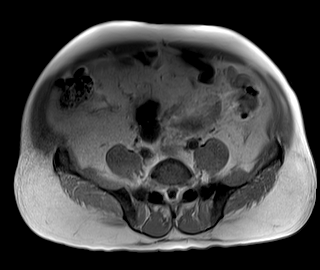
[im 80/80]
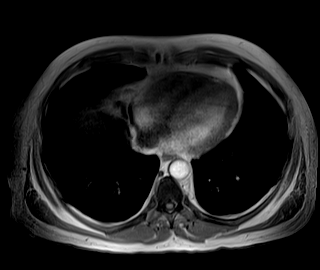

[Series 6: t1_vibe_opp-in_tra_p4_bh · axial · 3.0mm · 1.19mm/px · z∈[-239,-2]mm · 2 of 80 slices shown (2 of 2)]
[im 1/80]
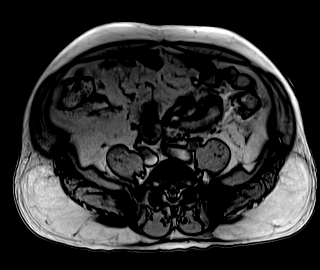
[im 80/80]
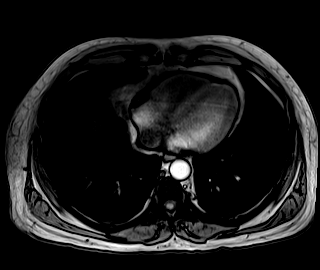

[Series 9: T2 fat-sat · axial · 6.0mm · 1.19mm/px · 1 of 36 slices shown]
[im 1/36]
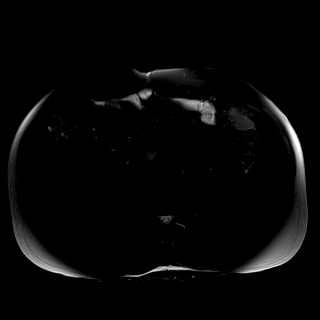

[Series 12: bSSFP · axial · 6.0mm · 0.74mm/px · 1 of 36 slices shown]
[im 1/36]
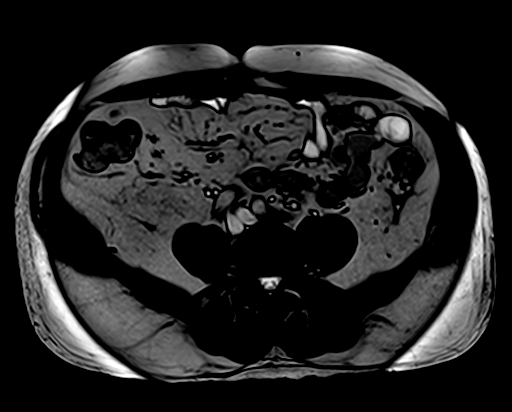

[Series 15: t2_space_cor_p2_trig_320_iso · coronal · 1.3mm · 0.59mm/px · 2 of 72 slices shown]
[im 1/72]
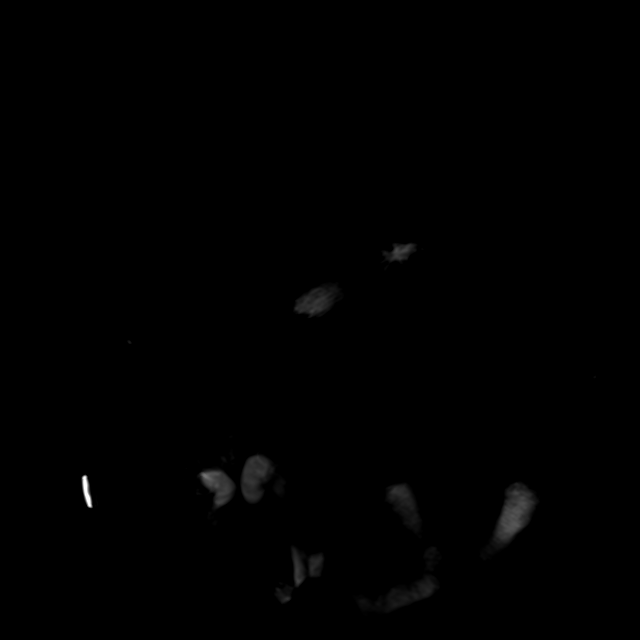
[im 72/72]
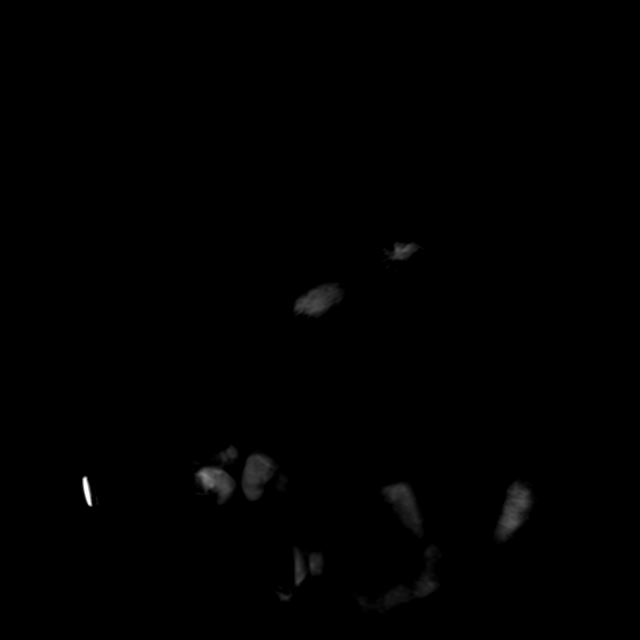

[Series 16: t2_space_cor_p2_trig_320_iso_mip_radials · sagittal · 0.59mm/px · 1 of 19 slices shown]
[im 1/19]
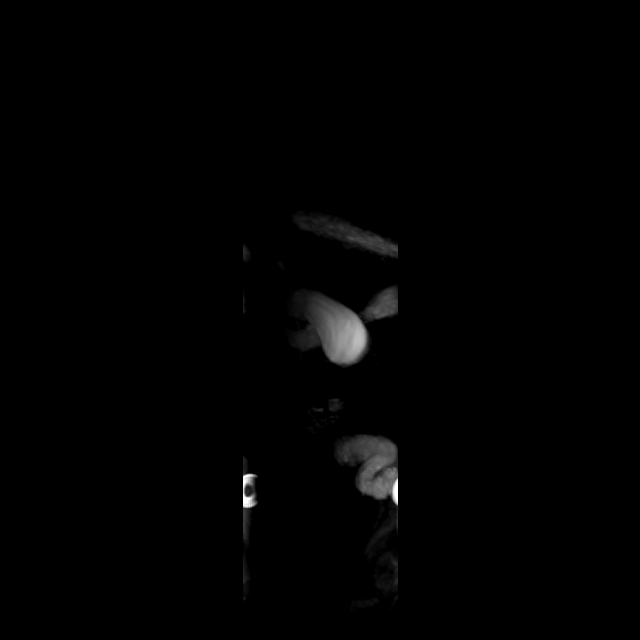

[Series 17: DWI · axial · 6.0mm · 1.42mm/px · z∈[-217,+50]mm · 4 of 114 slices shown (1 of 2)]
[im 1/114]
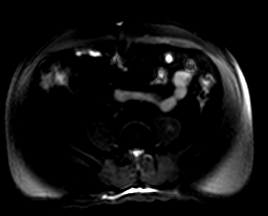
[im 38/114]
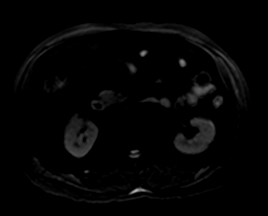
[im 76/114]
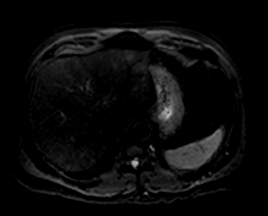
[im 114/114]
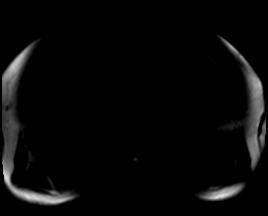

[Series 18: DWI · axial · 6.0mm · 1.42mm/px · 1 of 38 slices shown (2 of 2)]
[im 1/38]
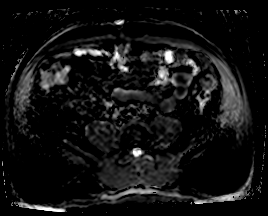

[Series 19: t1_vibe_fs_tra_p4_bh_pre · axial · 3.0mm · 1.19mm/px · z∈[-241,+20]mm · 3 of 88 slices shown]
[im 1/88]
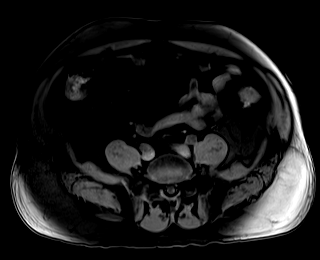
[im 44/88]
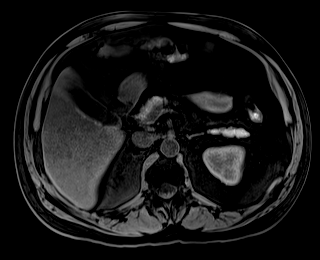
[im 88/88]
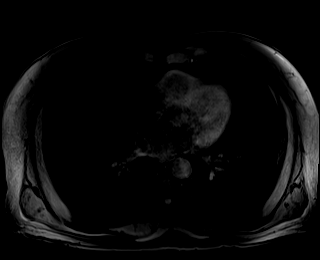

[Series 21: t1_vibe_fs_tra_p4_bh_post · axial · 3.0mm · 1.19mm/px · z∈[-241,+20]mm · 3 of 88 slices shown (1 of 4)]
[im 1/88]
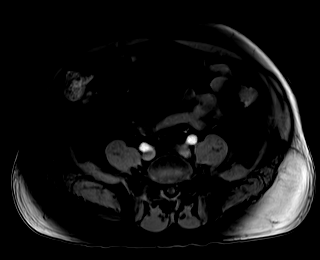
[im 44/88]
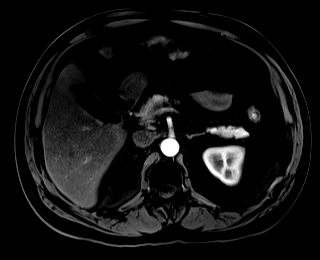
[im 88/88]
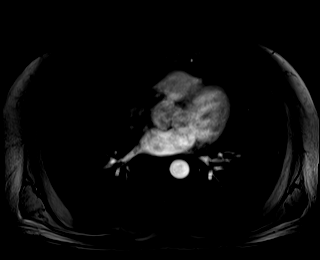

[Series 22: t1_vibe_fs_tra_p4_bh_post_sub · axial · 3.0mm · 1.19mm/px · z∈[-241,+20]mm · 3 of 88 slices shown (1 of 4)]
[im 1/88]
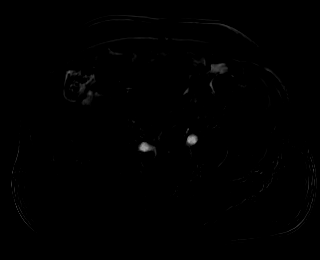
[im 44/88]
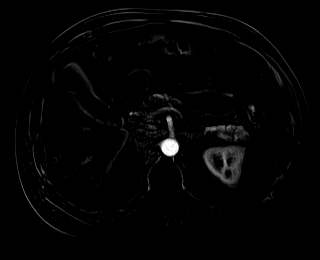
[im 88/88]
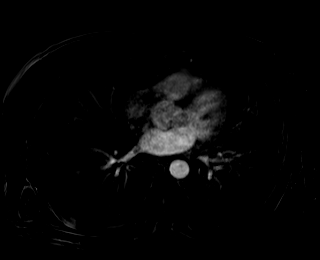

[Series 23: t1_vibe_fs_tra_p4_bh_post · axial · 3.0mm · 1.19mm/px · z∈[-241,+20]mm · 3 of 88 slices shown (2 of 4)]
[im 1/88]
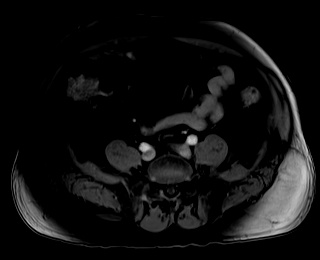
[im 44/88]
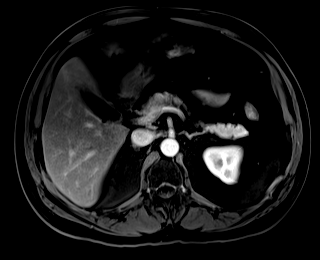
[im 88/88]
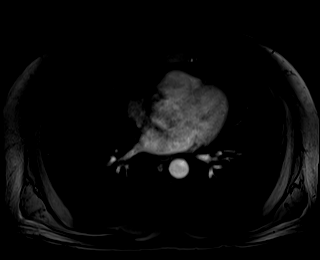

[Series 24: t1_vibe_fs_tra_p4_bh_post_sub · axial · 3.0mm · 1.19mm/px · z∈[-241,+20]mm · 3 of 88 slices shown (2 of 4)]
[im 1/88]
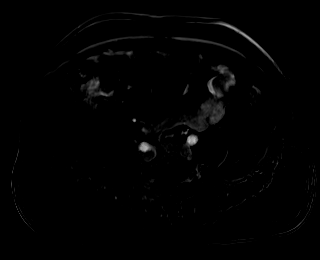
[im 44/88]
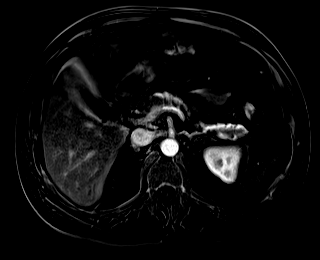
[im 88/88]
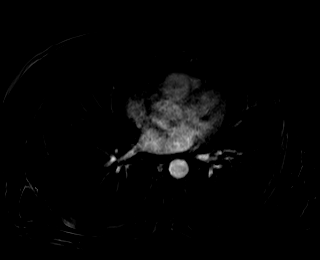

[Series 25: t1_vibe_fs_tra_p4_bh_post · axial · 3.0mm · 1.19mm/px · z∈[-241,+20]mm · 3 of 88 slices shown (3 of 4)]
[im 1/88]
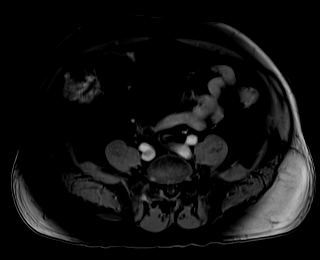
[im 44/88]
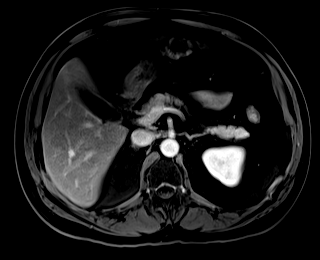
[im 88/88]
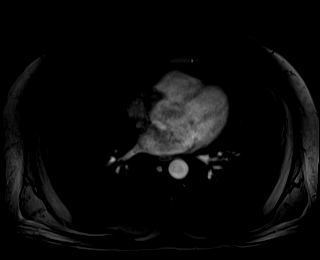

[Series 26: t1_vibe_fs_tra_p4_bh_post_sub · axial · 3.0mm · 1.19mm/px · z∈[-241,+20]mm · 3 of 88 slices shown (3 of 4)]
[im 1/88]
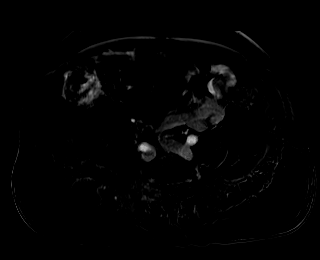
[im 44/88]
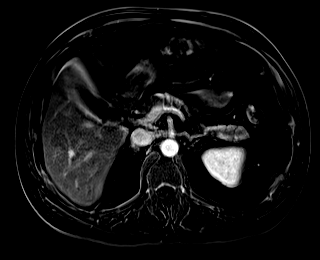
[im 88/88]
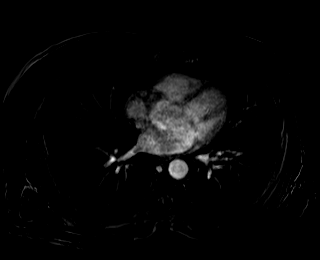

[Series 27: t1_vibe_fs_tra_p4_bh_post · axial · 3.0mm · 1.19mm/px · z∈[-241,+20]mm · 3 of 88 slices shown (4 of 4)]
[im 1/88]
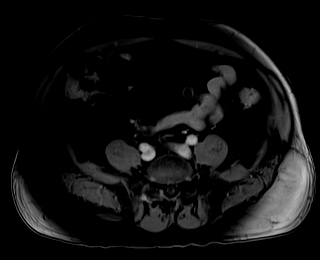
[im 44/88]
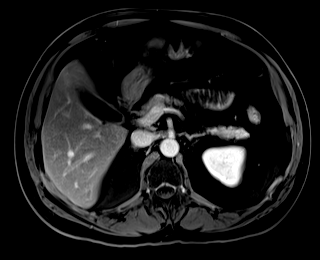
[im 88/88]
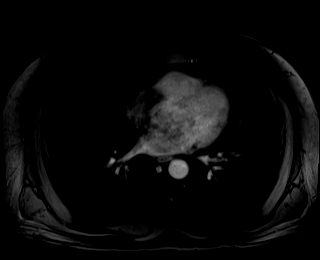

[Series 28: t1_vibe_fs_tra_p4_bh_post_sub · axial · 3.0mm · 1.19mm/px · z∈[-241,-112]mm · 2 of 88 slices shown (4 of 4)]
[im 1/88]
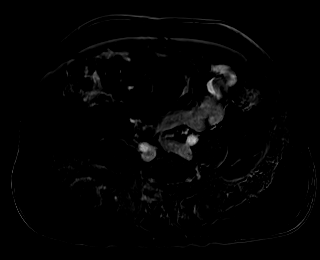
[im 44/88]
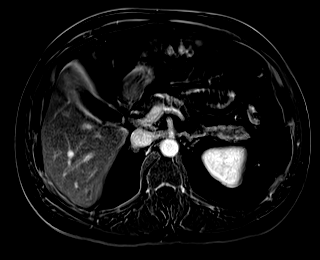

[42 of 48 positions shown; findings below may reference images not displayed]

FINDINGS: Lower chest: No acute findings.

Hepatobiliary: Hepatic steatosis. No mass or other parenchymal
abnormality identified. No biliary ductal dilatation.

Pancreas: There is no mass or abnormal contrast enhancement in the
central pancreatic head to correlate to finding of prior CT (series
21, image 57). No other evidence of mass, inflammatory changes, or
other parenchymal abnormality identified. No pancreatic ductal
dilatation.

Spleen:  Within normal limits in size and appearance.

Adrenals/Urinary Tract: No masses identified. No evidence of
hydronephrosis.

Stomach/Bowel: Colonic diverticula. Visualized portions within the
abdomen are otherwise unremarkable.

Vascular/Lymphatic: No pathologically enlarged lymph nodes
identified. No abdominal aortic aneurysm demonstrated.

Other:  None.

Musculoskeletal: No suspicious bone lesions identified.
IMPRESSION: 1. No pancreatic mass or other significant abnormality identified.
There is no MR correlate for a small lesion in the central
pancreatic head suspected to be arterially hyperenhancing on prior
CT, although not included in noncontrast images of the chest
performed on that examination. In particular there is no focal
signal abnormality or hyperenhancement on multiphasic contrast
enhanced examination, and this finding on prior CT is of uncertain
significance, possibly reflecting a small focus of calcification in
the pancreatic parenchyma. In the absence of any suspicious MR
finding, a pancreatic neuroendocrine tumor is not favored, and there
are no concerning secondary features such as lymphadenopathy or
pancreatic ductal dilatation. Consider follow-up CT of the abdomen
without and with contrast in 6 months to assess for stability and
true contrast enhancement characteristics as lesion was originally
appreciated on this modality.
2. Hepatic steatosis.

## 2022-09-12 ENCOUNTER — Telehealth: Payer: Self-pay

## 2022-09-12 NOTE — Telephone Encounter (Signed)
Unable to accept. (He just got establish with a new PCP 05-2022)

## 2022-09-12 NOTE — Telephone Encounter (Signed)
Pt called to schedule NP appt w/ Dr. Drue Novel- his wife works for [employer] [facility] and Dr. Drue Novel comes highly recommended. Informed Dr. Drue Novel is currently not accepting new patients, but that I would send him a message to see if he would be approved.   CB number: [phone]

## 2022-09-12 NOTE — Telephone Encounter (Signed)
Annice Pih- will you let Pt know of Dr. Leta Jungling response. He may want to establish with another provider in our office?

## 2022-09-17 NOTE — Telephone Encounter (Signed)
LVM informing pt was denied to be able to est with Dr Drue Novel - but that we have other options with other providers that are taking new pt in our office.

## 2022-09-24 ENCOUNTER — Encounter: Payer: Self-pay | Admitting: Emergency Medicine

## 2022-09-24 ENCOUNTER — Ambulatory Visit (INDEPENDENT_AMBULATORY_CARE_PROVIDER_SITE_OTHER): Payer: 59 | Admitting: Emergency Medicine

## 2022-09-24 ENCOUNTER — Other Ambulatory Visit (HOSPITAL_COMMUNITY): Payer: Self-pay

## 2022-09-24 VITALS — BP 132/74 | HR 116 | Temp 98.9°F | Ht 63.0 in | Wt 159.0 lb

## 2022-09-24 DIAGNOSIS — Z23 Encounter for immunization: Secondary | ICD-10-CM

## 2022-09-24 DIAGNOSIS — Z1211 Encounter for screening for malignant neoplasm of colon: Secondary | ICD-10-CM

## 2022-09-24 DIAGNOSIS — M5441 Lumbago with sciatica, right side: Secondary | ICD-10-CM

## 2022-09-24 DIAGNOSIS — Z7689 Persons encountering health services in other specified circumstances: Secondary | ICD-10-CM

## 2022-09-24 DIAGNOSIS — G8929 Other chronic pain: Secondary | ICD-10-CM | POA: Diagnosis not present

## 2022-09-24 DIAGNOSIS — I10 Essential (primary) hypertension: Secondary | ICD-10-CM

## 2022-09-24 DIAGNOSIS — M545 Low back pain, unspecified: Secondary | ICD-10-CM | POA: Insufficient documentation

## 2022-09-24 LAB — CBC WITH DIFFERENTIAL/PLATELET
Basophils Absolute: 0.1 10*3/uL (ref 0.0–0.1)
Basophils Relative: 0.8 % (ref 0.0–3.0)
Eosinophils Absolute: 0 10*3/uL (ref 0.0–0.7)
Eosinophils Relative: 0.1 % (ref 0.0–5.0)
HCT: 40.9 % (ref 39.0–52.0)
Hemoglobin: 13.9 g/dL (ref 13.0–17.0)
Lymphocytes Relative: 22.9 % (ref 12.0–46.0)
Lymphs Abs: 2 10*3/uL (ref 0.7–4.0)
MCHC: 34 g/dL (ref 30.0–36.0)
MCV: 101.7 fl — ABNORMAL HIGH (ref 78.0–100.0)
Monocytes Absolute: 0.7 10*3/uL (ref 0.1–1.0)
Monocytes Relative: 8.1 % (ref 3.0–12.0)
Neutro Abs: 6 10*3/uL (ref 1.4–7.7)
Neutrophils Relative %: 68.1 % (ref 43.0–77.0)
Platelets: 393 10*3/uL (ref 150.0–400.0)
RBC: 4.02 Mil/uL — ABNORMAL LOW (ref 4.22–5.81)
RDW: 14.6 % (ref 11.5–15.5)
WBC: 8.8 10*3/uL (ref 4.0–10.5)

## 2022-09-24 LAB — COMPREHENSIVE METABOLIC PANEL
ALT: 159 U/L — ABNORMAL HIGH (ref 0–53)
AST: 176 U/L — ABNORMAL HIGH (ref 0–37)
Albumin: 4.7 g/dL (ref 3.5–5.2)
Alkaline Phosphatase: 85 U/L (ref 39–117)
BUN: 24 mg/dL — ABNORMAL HIGH (ref 6–23)
CO2: 28 mEq/L (ref 19–32)
Calcium: 10.1 mg/dL (ref 8.4–10.5)
Chloride: 85 mEq/L — ABNORMAL LOW (ref 96–112)
Creatinine, Ser: 1.36 mg/dL (ref 0.40–1.50)
GFR: 56.86 mL/min — ABNORMAL LOW (ref >60.00)
Glucose, Bld: 128 mg/dL — ABNORMAL HIGH (ref 70–99)
Potassium: 3.3 mEq/L — ABNORMAL LOW (ref 3.5–5.1)
Sodium: 127 mEq/L — ABNORMAL LOW (ref 135–145)
Total Bilirubin: 1.6 mg/dL — ABNORMAL HIGH (ref 0.2–1.2)
Total Protein: 8.5 g/dL — ABNORMAL HIGH (ref 6.0–8.3)

## 2022-09-24 LAB — LIPID PANEL
Cholesterol: 145 mg/dL (ref 0–200)
HDL: 56.4 mg/dL (ref >39.00)
Total CHOL/HDL Ratio: 3
Triglycerides: 452 mg/dL — ABNORMAL HIGH (ref 0.0–149.0)

## 2022-09-24 LAB — LDL CHOLESTEROL, DIRECT: Direct LDL: 44 mg/dL

## 2022-09-24 MED ORDER — TRAMADOL HCL 50 MG PO TABS
50.0000 mg | ORAL_TABLET | Freq: Three times a day (TID) | ORAL | 0 refills | Status: DC | PRN
  Filled 2022-09-24: qty 20, 7d supply, fill #0

## 2022-09-24 MED ORDER — TRAMADOL HCL 50 MG PO TABS
50.0000 mg | ORAL_TABLET | Freq: Three times a day (TID) | ORAL | 0 refills | Status: AC | PRN
Start: 2022-09-24 — End: 2022-10-01
  Filled 2022-09-24: qty 20, 7d supply, fill #0

## 2022-09-24 MED ORDER — TRAMADOL HCL 50 MG PO TABS: 50.0000 mg | ORAL_TABLET | Freq: Three times a day (TID) | ORAL | 0 refills | Status: DC | PRN

## 2022-09-24 NOTE — Patient Instructions (Signed)
Acute Back Pain, Adult Acute back pain is sudden and usually short-lived. It is often caused by an injury to the muscles and tissues in the back. The injury may result from: A muscle, tendon, or ligament getting overstretched or torn. Ligaments are tissues that connect bones to each other. Lifting something improperly can cause a back strain. Wear and tear (degeneration) of the spinal disks. Spinal disks are circular tissue that provide cushioning between the bones of the spine (vertebrae). Twisting motions, such as while playing sports or doing yard work. A hit to the back. Arthritis. You may have a physical exam, lab tests, and imaging tests to find the cause of your pain. Acute back pain usually goes away with rest and home care. Follow these instructions at home: Managing pain, stiffness, and swelling Take over-the-counter and prescription medicines only as told by your health care provider. Treatment may include medicines for pain and inflammation that are taken by mouth or applied to the skin, or muscle relaxants. Your health care provider may recommend applying ice during the first 24-48 hours after your pain starts. To do this: Put ice in a plastic bag. Place a towel between your skin and the bag. Leave the ice on for 20 minutes, 2-3 times a day. Remove the ice if your skin turns bright red. This is very important. If you cannot feel pain, heat, or cold, you have a greater risk of damage to the area. If directed, apply heat to the affected area as often as told by your health care provider. Use the heat source that your health care provider recommends, such as a moist heat pack or a heating pad. Place a towel between your skin and the heat source. Leave the heat on for 20-30 minutes. Remove the heat if your skin turns bright red. This is especially important if you are unable to feel pain, heat, or cold. You have a greater risk of getting burned. Activity  Do not stay in bed. Staying in  bed for more than 1-2 days can delay your recovery. Sit up and stand up straight. Avoid leaning forward when you sit or hunching over when you stand. If you work at a desk, sit close to it so you do not need to lean over. Keep your chin tucked in. Keep your neck drawn back, and keep your elbows bent at a 90-degree angle (right angle). Sit high and close to the steering wheel when you drive. Add lower back (lumbar) support to your car seat, if needed. Take short walks on even surfaces as soon as you are able. Try to increase the length of time you walk each day. Do not sit, drive, or stand in one place for more than 30 minutes at a time. Sitting or standing for long periods of time can put stress on your back. Do not drive or use heavy machinery while taking prescription pain medicine. Use proper lifting techniques. When you bend and lift, use positions that put less stress on your back: Bend your knees. Keep the load close to your body. Avoid twisting. Exercise regularly as told by your health care provider. Exercising helps your back heal faster and helps prevent back injuries by keeping muscles strong and flexible. Work with a physical therapist to make a safe exercise program, as recommended by your health care provider. Do any exercises as told by your physical therapist. Lifestyle Maintain a healthy weight. Extra weight puts stress on your back and makes it difficult to have good   posture. Avoid activities or situations that make you feel anxious or stressed. Stress and anxiety increase muscle tension and can make back pain worse. Learn ways to manage anxiety and stress, such as through exercise. General instructions Sleep on a firm mattress in a comfortable position. Try lying on your side with your knees slightly bent. If you lie on your back, put a pillow under your knees. Keep your head and neck in a straight line with your spine (neutral position) when using electronic equipment like  smartphones or pads. To do this: Raise your smartphone or pad to look at it instead of bending your head or neck to look down. Put the smartphone or pad at the level of your face while looking at the screen. Follow your treatment plan as told by your health care provider. This may include: Cognitive or behavioral therapy. Acupuncture or massage therapy. Meditation or yoga. Contact a health care provider if: You have pain that is not relieved with rest or medicine. You have increasing pain going down into your legs or buttocks. Your pain does not improve after 2 weeks. You have pain at night. You lose weight without trying. You have a fever or chills. You develop nausea or vomiting. You develop abdominal pain. Get help right away if: You develop new bowel or bladder control problems. You have unusual weakness or numbness in your arms or legs. You feel faint. These symptoms may represent a serious problem that is an emergency. Do not wait to see if the symptoms will go away. Get medical help right away. Call your local emergency services (911 in the [place] Do not drive yourself to the hospital. Summary Acute back pain is sudden and usually short-lived. Use proper lifting techniques. When you bend and lift, use positions that put less stress on your back. Take over-the-counter and prescription medicines only as told by your health care provider, and apply heat or ice as told. This information is not intended to replace advice given to you by your health care provider. Make sure you discuss any questions you have with your health care provider. Document Revised: 06/23/2020 Document Reviewed: 06/23/2020 Elsevier Patient Education  2024 Elsevier Inc.  

## 2022-09-24 NOTE — Assessment & Plan Note (Signed)
Chronic and affecting quality of life Pain management discussed.  Over-the-counter analgesics not helping much Recommend to start tramadol 50 mg as needed for pain Needs orthopedic evaluation.  Referral placed today. Needs lumbar spine MRI.  Ordered today.

## 2022-09-24 NOTE — Progress Notes (Signed)
Perry Zamora 62 y.o.   Chief Complaint  Patient presents with   New Patient (Initial Visit)    Patient states he is having some back issues pain, lower back     HISTORY OF PRESENT ILLNESS: This is a 62 y.o. male A1A first visit to this office, here to establish care with me.  Originally from [place]. History of chronic lumbar pain with radiation to right leg.  Has tried over-the-counter analgesics.  Not helping.  Pain becoming unbearable mostly at night Denies bowel or bladder issues. Went to emergency department on 05/21/2022 complaining of low back pain.  At that time he was walking, right leg gave out and fell down. Assessment and plan from that visit as follows: MDM   The patient is a 62 y.o. male with a PMH significant for HTN who presents today for back pain.   History and exam as documented above, most consistent with back muscular strain vs disc herniation vs lumbar radiculopathy. No traumatic injuries or midline tenderness, doubt fractures or ligamentous injuries. No neuro deficits or history of saddle anesthesia or bladder/bowel dysfunction, doubt cauda equina syndrome or acute cord compression. Nothing in the history or exam to concern me for AAA. Denies urinary symptoms and no CVAT, doubt kidney stone, pyelonephritis, and perinephritic abscess. No fever/chills or h/o IVDA or other immunocompromising conditions, doubt osteomyelitis, discitis, and epidural abscess. Considered mass lesion, no constitutional symptoms, lower suspicion for mass lesions such as cancer. Offered lumbar x-rays given age, the patient would like to proceed with imaging.  Multimodal pain approach in the ED.   2:33 PM Patient to desk with what he was taking. Googled pill: Pill with imprint ALVA is Orange, Oval and has been identified as Journalist, newspaper acetaminophen 500 mg / pamabrom 25 mg. Recommended to stop taking this medication given the diuretic component.  Lumbar x-ray: no acute fractures, no mass  lesions.  1.  No acute fracture or malalignment.  2.  No radiographic evidence of osseous lesion/mass in the lumbar spine. If there is persistent clinical concern, consider nonemergent MRI for further evaluation.  3.  Mild multilevel disc degenerative changes. Mild lower lumbar facet arthropathy.     HPI   Prior to Admission medications   Medication Sig Start Date End Date Taking? Authorizing Provider  aspirin 81 MG EC tablet Take 1 tablet (81 mg total) by mouth daily. Swallow whole. 04/26/20  Yes Georgian Co M, PA-C  lisinopril (ZESTRIL) 10 MG tablet Take 1 tablet (10 mg total) by mouth daily. 04/26/20  Yes McClung, Marzella Schlein, PA-C  traMADol (ULTRAM) 50 MG tablet Take 1 tablet (50 mg total) by mouth every 8 (eight) hours as needed for up to 5 days. 09/24/22 09/29/22 Yes Gurnoor Sloop, Eilleen Kempf, MD  hydroxypropyl methylcellulose / hypromellose (ISOPTO TEARS / GONIOVISC) 2.5 % ophthalmic solution Place 1 drop into both eyes 4 (four) times daily as needed for dry eyes.    [provider]    No Known Allergies  Patient Active Problem List   Diagnosis Date Noted   Chronic right-sided low back pain with right-sided sciatica 09/24/2022   Pulmonary nodule 03/25/2020    Past Medical History:  Diagnosis Date   HTN (hypertension)     No past surgical history on file.  Social History   Socioeconomic History   Marital status: Married    Spouse name: Not on file   Number of children: Not on file   Years of education: Not on file   Highest  education level: Not on file  Occupational History   Not on file  Tobacco Use   Smoking status: Never   Smokeless tobacco: Never  Vaping Use   Vaping Use: Never used  Substance and Sexual Activity   Alcohol use: Not Currently    Comment: heavy until Quit 6 months ago   Drug use: Never   Sexual activity: Not on file  Other Topics Concern   Not on file  Social History Narrative   Not on file   Social Determinants of Health    Financial Resource Strain: Not on file  Food Insecurity: Not on file  Transportation Needs: Not on file  Physical Activity: Not on file  Stress: Not on file  Social Connections: Not on file  Intimate Partner Violence: Not on file    No family history on file.   Review of Systems  Constitutional: Negative.  Negative for chills and fever.  HENT: Negative.  Negative for congestion and sore throat.   Respiratory: Negative.  Negative for cough and shortness of breath.   Cardiovascular: Negative.  Negative for chest pain and palpitations.  Gastrointestinal: Negative.  Negative for abdominal pain, diarrhea, nausea and vomiting.  Genitourinary: Negative.  Negative for dysuria and hematuria.  Musculoskeletal:  Positive for back pain.  Skin: Negative.  Negative for rash.  Neurological: Negative.  Negative for dizziness and headaches.  All other systems reviewed and are negative.   Vitals:   09/24/22 1354  BP: 132/74  Pulse: (!) 116  Temp: 98.9 F (37.2 C)  SpO2: 98%    Physical Exam Vitals reviewed.  Constitutional:      Appearance: Normal appearance.  HENT:     Head: Normocephalic.     Mouth/Throat:     Mouth: Mucous membranes are moist.     Pharynx: Oropharynx is clear.  Eyes:     Extraocular Movements: Extraocular movements intact.     Conjunctiva/sclera: Conjunctivae normal.     Pupils: Pupils are equal, round, and reactive to light.  Cardiovascular:     Rate and Rhythm: Normal rate and regular rhythm.     Pulses: Normal pulses.     Heart sounds: Normal heart sounds.  Pulmonary:     Effort: Pulmonary effort is normal.     Breath sounds: Normal breath sounds.  Abdominal:     Palpations: Abdomen is soft.     Tenderness: There is no abdominal tenderness.  Musculoskeletal:     Cervical back: No tenderness.     Lumbar back: Spasms and tenderness present. No bony tenderness. Decreased range of motion.     Right lower leg: No edema.     Left lower leg: No edema.   Lymphadenopathy:     Cervical: No cervical adenopathy.  Skin:    General: Skin is warm and dry.     Capillary Refill: Capillary refill takes less than 2 seconds.  Neurological:     General: No focal deficit present.     Mental Status: He is alert and oriented to person, place, and time.  Psychiatric:        Mood and Affect: Mood normal.        Behavior: Behavior normal.      ASSESSMENT & PLAN: A total of 48 minutes was spent with the patient and counseling/coordination of care regarding preparing for this visit, review of available medical records, establishing care with me, comprehensive history and physical examination, review of any chronic medical conditions under treatment, review of all medications, differential  diagnosis of chronic lumbar pain and need for orthopedic evaluation, pain management, prognosis, documentation, and need for follow-up.  Problem List Items Addressed This Visit       Cardiovascular and Mediastinum   Essential hypertension    Well-controlled hypertension Continue lisinopril 10 mg daily Blood work done today      Relevant Orders   CBC with Differential/Platelet   Comprehensive metabolic panel   Hemoglobin A1c   Lipid panel     Nervous and Auditory   Chronic right-sided low back pain with right-sided sciatica - Primary    Chronic and affecting quality of life Pain management discussed.  Over-the-counter analgesics not helping much Recommend to start tramadol 50 mg as needed for pain Needs orthopedic evaluation.  Referral placed today. Needs lumbar spine MRI.  Ordered today.      Relevant Medications   traMADol (ULTRAM) 50 MG tablet   Other Relevant Orders   Ambulatory referral to Orthopedic Surgery   MR Lumbar Spine Wo Contrast   Other Visit Diagnoses     Screening for colon cancer       Relevant Orders   Ambulatory referral to Gastroenterology   Encounter to establish care       Need for vaccination       Relevant Orders   Zoster  Recombinant (Shingrix ) (Completed)   Tdap vaccine greater than or equal to 7yo IM (Completed)      Patient Instructions  Acute Back Pain, Adult Acute back pain is sudden and usually short-lived. It is often caused by an injury to the muscles and tissues in the back. The injury may result from: A muscle, tendon, or ligament getting overstretched or torn. Ligaments are tissues that connect bones to each other. Lifting something improperly can cause a back strain. Wear and tear (degeneration) of the spinal disks. Spinal disks are circular tissue that provide cushioning between the bones of the spine (vertebrae). Twisting motions, such as while playing sports or doing yard work. A hit to the back. Arthritis. You may have a physical exam, lab tests, and imaging tests to find the cause of your pain. Acute back pain usually goes away with rest and home care. Follow these instructions at home: Managing pain, stiffness, and swelling Take over-the-counter and prescription medicines only as told by your health care provider. Treatment may include medicines for pain and inflammation that are taken by mouth or applied to the skin, or muscle relaxants. Your health care provider may recommend applying ice during the first 24-48 hours after your pain starts. To do this: Put ice in a plastic bag. Place a towel between your skin and the bag. Leave the ice on for 20 minutes, 2-3 times a day. Remove the ice if your skin turns bright red. This is very important. If you cannot feel pain, heat, or cold, you have a greater risk of damage to the area. If directed, apply heat to the affected area as often as told by your health care provider. Use the heat source that your health care provider recommends, such as a moist heat pack or a heating pad. Place a towel between your skin and the heat source. Leave the heat on for 20-30 minutes. Remove the heat if your skin turns bright red. This is especially important if  you are unable to feel pain, heat, or cold. You have a greater risk of getting burned. Activity  Do not stay in bed. Staying in bed for more than 1-2 days can delay  your recovery. Sit up and stand up straight. Avoid leaning forward when you sit or hunching over when you stand. If you work at a desk, sit close to it so you do not need to lean over. Keep your chin tucked in. Keep your neck drawn back, and keep your elbows bent at a 90-degree angle (right angle). Sit high and close to the steering wheel when you drive. Add lower back (lumbar) support to your car seat, if needed. Take short walks on even surfaces as soon as you are able. Try to increase the length of time you walk each day. Do not sit, drive, or stand in one place for more than 30 minutes at a time. Sitting or standing for long periods of time can put stress on your back. Do not drive or use heavy machinery while taking prescription pain medicine. Use proper lifting techniques. When you bend and lift, use positions that put less stress on your back: [place] your knees. Keep the load close to your body. Avoid twisting. Exercise regularly as told by your health care provider. Exercising helps your back heal faster and helps prevent back injuries by keeping muscles strong and flexible. Work with a physical therapist to make a safe exercise program, as recommended by your health care provider. Do any exercises as told by your physical therapist. Lifestyle Maintain a healthy weight. Extra weight puts stress on your back and makes it difficult to have good posture. Avoid activities or situations that make you feel anxious or stressed. Stress and anxiety increase muscle tension and can make back pain worse. Learn ways to manage anxiety and stress, such as through exercise. General instructions Sleep on a firm mattress in a comfortable position. Try lying on your side with your knees slightly bent. If you lie on your back, put a pillow under  your knees. Keep your head and neck in a straight line with your spine (neutral position) when using electronic equipment like smartphones or pads. To do this: Raise your smartphone or pad to look at it instead of bending your head or neck to look down. Put the smartphone or pad at the level of your face while looking at the screen. Follow your treatment plan as told by your health care provider. This may include: Cognitive or behavioral therapy. Acupuncture or massage therapy. Meditation or yoga. Contact a health care provider if: You have pain that is not relieved with rest or medicine. You have increasing pain going down into your legs or buttocks. Your pain does not improve after 2 weeks. You have pain at night. You lose weight without trying. You have a fever or chills. You develop nausea or vomiting. You develop abdominal pain. Get help right away if: You develop new bowel or bladder control problems. You have unusual weakness or numbness in your arms or legs. You feel faint. These symptoms may represent a serious problem that is an emergency. Do not wait to see if the symptoms will go away. Get medical help right away. Call your local emergency services (911 in the [place] Do not drive yourself to the hospital. Summary Acute back pain is sudden and usually short-lived. Use proper lifting techniques. When you bend and lift, use positions that put less stress on your back. Take over-the-counter and prescription medicines only as told by your health care provider, and apply heat or ice as told. This information is not intended to replace advice given to you by your health care provider. Make sure you  discuss any questions you have with your health care provider. Document Revised: 06/23/2020 Document Reviewed: 06/23/2020 Elsevier Patient Education  2024 Elsevier Inc.     Edwina Barth, MD [facility]

## 2022-09-24 NOTE — Assessment & Plan Note (Signed)
Well-controlled hypertension Continue lisinopril 10 mg daily Blood work done today

## 2022-09-25 ENCOUNTER — Other Ambulatory Visit: Payer: Self-pay | Admitting: Emergency Medicine

## 2022-09-25 DIAGNOSIS — K760 Fatty (change of) liver, not elsewhere classified: Secondary | ICD-10-CM | POA: Insufficient documentation

## 2022-09-25 DIAGNOSIS — R7401 Elevation of levels of liver transaminase levels: Secondary | ICD-10-CM | POA: Insufficient documentation

## 2022-09-25 LAB — HEMOGLOBIN A1C: Hgb A1c MFr Bld: 5.3 % (ref 4.6–6.5)

## 2022-09-29 ENCOUNTER — Ambulatory Visit
Admission: RE | Admit: 2022-09-29 | Discharge: 2022-09-29 | Disposition: A | Discharge status: Home / Self Care | Payer: 59 | Source: Ambulatory Visit | Attending: Emergency Medicine | Admitting: Emergency Medicine

## 2022-09-29 DIAGNOSIS — M47816 Spondylosis without myelopathy or radiculopathy, lumbar region: Secondary | ICD-10-CM | POA: Diagnosis not present

## 2022-09-29 DIAGNOSIS — G8929 Other chronic pain: Secondary | ICD-10-CM

## 2022-09-29 DIAGNOSIS — M5126 Other intervertebral disc displacement, lumbar region: Secondary | ICD-10-CM | POA: Diagnosis not present

## 2022-10-03 ENCOUNTER — Encounter: Payer: Self-pay | Admitting: Emergency Medicine

## 2022-10-03 DIAGNOSIS — R7401 Elevation of levels of liver transaminase levels: Secondary | ICD-10-CM

## 2022-10-03 NOTE — Telephone Encounter (Signed)
Yes it can be done without contrast.  Only oral contrast.  We also order abdominal ultrasound.  Look into status of these orders.  Thanks.

## 2022-10-07 NOTE — Telephone Encounter (Signed)
Please place order as requested

## 2022-10-09 ENCOUNTER — Telehealth: Payer: Self-pay | Admitting: Gastroenterology

## 2022-10-09 NOTE — Telephone Encounter (Signed)
Hi Dr. Barron Alvine,   Supervising Provider: 10/09/22-Am  We received a referral for patient to have a colonoscopy. He does have GI history with Dr. Regino Schultze and is asking for a transfer of care due to insurance. His records were obtained and scanned into [employer] for yo to review and advise on scheduling.   Thank you

## 2022-10-16 ENCOUNTER — Ambulatory Visit: Payer: 59 | Admitting: Physician Assistant

## 2022-10-23 ENCOUNTER — Encounter: Payer: Self-pay | Admitting: Orthopedic Surgery

## 2022-10-23 ENCOUNTER — Other Ambulatory Visit (INDEPENDENT_AMBULATORY_CARE_PROVIDER_SITE_OTHER): Payer: 59

## 2022-10-23 ENCOUNTER — Ambulatory Visit (INDEPENDENT_AMBULATORY_CARE_PROVIDER_SITE_OTHER): Payer: 59 | Admitting: Orthopedic Surgery

## 2022-10-23 DIAGNOSIS — G8929 Other chronic pain: Secondary | ICD-10-CM

## 2022-10-23 DIAGNOSIS — M5416 Radiculopathy, lumbar region: Secondary | ICD-10-CM | POA: Diagnosis not present

## 2022-10-23 DIAGNOSIS — M545 Low back pain, unspecified: Secondary | ICD-10-CM | POA: Diagnosis not present

## 2022-10-23 NOTE — Progress Notes (Signed)
Orthopedic Spine Surgery Office Note  Assessment: Patient is a 62 y.o. male with low back pain that radiates into the right buttock.  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 Discussed that the conservative treatments include:  -activity modification  -physical therapy  -over the counter pain medications  -medrol dosepak  -lumbar steroid injections -Patient has tried Tylenol, NSAIDs, TENS unit, heat, tramadol -Recommended diagnostic/therapeutic transforaminal injection at L5/S1 on the right -Patient should return to office in 4 weeks, x-rays at next visit: None   Patient expressed understanding of the plan and all questions were answered to the patient's satisfaction.   ___________________________________________________________________________   History:  Patient is a 62 y.o. male who presents today for lumbar spine.  Patient has had 2 years of low back pain that radiates into the right buttock.  It is gotten progressively worse with time.  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  He sometimes feels that his right leg gives out due to the pain.  No pain radiating past the buttock on the right side.  No pain radiating into the left lower extremity.  There is no trauma or injury that preceded the onset of pain.   Weakness: Yes, sometimes right leg gives out on him.  No other weakness noted Symptoms of imbalance: Denies Paresthesias and numbness: Denies Bowel or bladder incontinence: Denies Saddle anesthesia: Denies  Treatments tried: TENS unit, heat, Tylenol, ibuprofen, tramadol  Review of systems: Denies fevers and chills, night sweats, unexplained weight loss, history of cancer.  Has had pain that wakes him at  night  Past medical history: Hypertension OSA Chronic pain  Allergies: NKDA  Past surgical history:  None  Social history: Denies use of nicotine product (smoking, vaping, patches, smokeless) Alcohol use: Rare Denies recreational drug use   Physical Exam:  BMI of 28.2  General: no acute distress, appears stated age Neurologic: alert, answering questions appropriately, following commands Respiratory: unlabored breathing on room air, symmetric chest rise Psychiatric: appropriate affect, normal cadence to speech   MSK (spine):  -Strength exam      Left  Right EHL    5/5  5/5 TA    5/5  5/5 GSC    5/5  5/5 Knee extension  5/5  5/5 Hip flexion   5/5  5/5  -Sensory exam    Sensation intact to light touch in L3-S1 nerve distributions of bilateral lower extremities  -Achilles DTR: 2/4 on the left, 2/4 on the right -Patellar tendon DTR: 2/4 on the left, 2/4 on the right  -Straight leg raise: Negative bilaterally -Femoral nerve stretch test: Negative bilaterally -Clonus: no beats bilaterally  -Left hip exam: No pain through range of motion, negative Stinchfield, negative FABER -Right hip exam: No pain through range of motion, negative Stinchfield, negative FABER  Imaging: XR of the lumbar spine from 09/23/2022 was independently reviewed and interpreted, showing no significant degenerative changes.  No fracture or dislocation seen.  No evidence of instability on flexion/extension views.  MRI of the lumbar spine from 6//16/2024 was independently reviewed and interpreted, showing mild lateral recess stenosis bilaterally at L4/5.  Foraminal stenosis at L5/S1 on the right.  No other significant stenosis seen.   Patient name: Perry Zamora Patient MRN:  [id number] Date of visit: 10/23/22

## 2022-10-27 ENCOUNTER — Other Ambulatory Visit: Payer: Self-pay | Admitting: Emergency Medicine

## 2022-10-28 ENCOUNTER — Other Ambulatory Visit (HOSPITAL_COMMUNITY): Payer: Self-pay

## 2022-10-28 ENCOUNTER — Other Ambulatory Visit: Payer: Self-pay

## 2022-10-28 MED ORDER — LOSARTAN POTASSIUM 50 MG PO TABS
50.0000 mg | ORAL_TABLET | Freq: Every day | ORAL | 3 refills | Status: DC
  Filled 2022-10-28 – 2022-11-05 (×3): qty 90, 90d supply, fill #0

## 2022-10-28 MED ORDER — DICLOFENAC SODIUM 75 MG PO TBEC
75.0000 mg | DELAYED_RELEASE_TABLET | Freq: Two times a day (BID) | ORAL | 1 refills | Status: DC | PRN
  Filled 2022-10-28 – 2022-12-27 (×5): qty 30, 15d supply, fill #0
  Filled 2023-06-30: qty 30, 15d supply, fill #1

## 2022-10-28 MED ORDER — HYDROCHLOROTHIAZIDE 25 MG PO TABS
25.0000 mg | ORAL_TABLET | Freq: Every morning | ORAL | 3 refills | Status: DC
  Filled 2022-10-28 – 2023-01-21 (×16): qty 90, 90d supply, fill #0
  Filled 2023-04-03 – 2023-04-19 (×2): qty 90, 90d supply, fill #1
  Filled 2023-05-26 – 2023-07-01 (×3): qty 90, 90d supply, fill #2
  Filled 2023-07-30 – 2023-09-29 (×4): qty 90, 90d supply, fill #3

## 2022-10-28 MED ORDER — ESCITALOPRAM OXALATE 10 MG PO TABS
10.0000 mg | ORAL_TABLET | Freq: Every day | ORAL | 1 refills | Status: DC
Start: 2022-10-28 — End: 2023-10-03
  Filled 2022-10-28 – 2023-01-21 (×14): qty 90, 90d supply, fill #0
  Filled 2023-05-26: qty 90, 90d supply, fill #1

## 2022-10-28 MED ORDER — POTASSIUM CHLORIDE ER 10 MEQ PO TBCR
10.0000 meq | EXTENDED_RELEASE_TABLET | Freq: Every day | ORAL | 1 refills | Status: DC
  Filled 2022-10-28 – 2022-11-05 (×3): qty 30, 30d supply, fill #0

## 2022-10-31 ENCOUNTER — Other Ambulatory Visit: Payer: Self-pay

## 2022-11-01 ENCOUNTER — Ambulatory Visit
Admission: RE | Admit: 2022-11-01 | Discharge: 2022-11-01 | Disposition: A | Discharge status: Home / Self Care | Payer: 59 | Source: Ambulatory Visit | Attending: Emergency Medicine | Admitting: Emergency Medicine

## 2022-11-01 DIAGNOSIS — R7401 Elevation of levels of liver transaminase levels: Secondary | ICD-10-CM

## 2022-11-01 DIAGNOSIS — K760 Fatty (change of) liver, not elsewhere classified: Secondary | ICD-10-CM | POA: Diagnosis not present

## 2022-11-04 ENCOUNTER — Other Ambulatory Visit (HOSPITAL_COMMUNITY): Payer: Self-pay

## 2022-11-04 ENCOUNTER — Other Ambulatory Visit: Payer: Self-pay

## 2022-11-04 ENCOUNTER — Other Ambulatory Visit: Payer: Self-pay | Admitting: Emergency Medicine

## 2022-11-04 MED ORDER — POTASSIUM CHLORIDE ER 10 MEQ PO TBCR
10.0000 meq | EXTENDED_RELEASE_TABLET | Freq: Every day | ORAL | 3 refills | Status: DC
  Filled 2022-11-04 – 2022-11-05 (×2): qty 90, 90d supply, fill #0

## 2022-11-04 MED ORDER — HYPROMELLOSE (GONIOSCOPIC) 2.5 % OP SOLN
1.0000 [drp] | Freq: Four times a day (QID) | OPHTHALMIC | 1 refills | Status: DC | PRN
  Filled 2022-11-04 – 2022-11-05 (×3): qty 15, 75d supply, fill #0
  Filled 2022-11-06: qty 15, 38d supply, fill #0

## 2022-11-04 MED ORDER — GABAPENTIN 300 MG PO CAPS
300.0000 mg | ORAL_CAPSULE | Freq: Every evening | ORAL | 2 refills | Status: DC
  Filled 2022-11-04 – 2022-11-05 (×4): qty 30, 30d supply, fill #0

## 2022-11-04 MED ORDER — LOSARTAN POTASSIUM 50 MG PO TABS
50.0000 mg | ORAL_TABLET | Freq: Every day | ORAL | 3 refills | Status: DC
  Filled 2022-11-04 – 2022-11-05 (×3): qty 90, 90d supply, fill #0
  Filled 2022-12-09 – 2023-01-17 (×10): qty 90, 90d supply, fill #1
  Filled 2023-04-03: qty 90, 90d supply, fill #2

## 2022-11-04 MED ORDER — DICLOFENAC SODIUM 75 MG PO TBEC
75.0000 mg | DELAYED_RELEASE_TABLET | Freq: Two times a day (BID) | ORAL | 2 refills | Status: DC
  Filled 2022-11-04 – 2022-11-05 (×3): qty 60, 30d supply, fill #0

## 2022-11-04 MED ORDER — ESCITALOPRAM OXALATE 10 MG PO TABS
10.0000 mg | ORAL_TABLET | Freq: Every day | ORAL | 3 refills | Status: DC
  Filled 2022-11-04 – 2022-11-05 (×3): qty 90, 90d supply, fill #0

## 2022-11-04 MED ORDER — HYDROCHLOROTHIAZIDE 25 MG PO TABS
25.0000 mg | ORAL_TABLET | Freq: Every day | ORAL | 3 refills | Status: DC
  Filled 2022-11-04 – 2022-11-05 (×3): qty 90, 90d supply, fill #0

## 2022-11-05 ENCOUNTER — Other Ambulatory Visit (HOSPITAL_COMMUNITY): Payer: Self-pay

## 2022-11-06 ENCOUNTER — Other Ambulatory Visit: Payer: Self-pay

## 2022-11-06 ENCOUNTER — Other Ambulatory Visit (HOSPITAL_COMMUNITY): Payer: Self-pay

## 2022-11-07 ENCOUNTER — Other Ambulatory Visit: Payer: Self-pay

## 2022-11-07 ENCOUNTER — Other Ambulatory Visit (HOSPITAL_COMMUNITY): Payer: Self-pay

## 2022-11-12 ENCOUNTER — Ambulatory Visit: Payer: 59 | Admitting: Physical Medicine and Rehabilitation

## 2022-11-12 ENCOUNTER — Other Ambulatory Visit: Payer: Self-pay

## 2022-11-12 ENCOUNTER — Other Ambulatory Visit (HOSPITAL_COMMUNITY): Payer: Self-pay

## 2022-11-12 VITALS — BP 158/107 | HR 108

## 2022-11-12 DIAGNOSIS — M5416 Radiculopathy, lumbar region: Secondary | ICD-10-CM | POA: Diagnosis not present

## 2022-11-12 MED ORDER — METHYLPREDNISOLONE ACETATE 80 MG/ML IJ SUSP
80.0000 mg | Freq: Once | INTRAMUSCULAR | Status: AC
  Administered 2022-11-12: 80 mg

## 2022-11-12 NOTE — Patient Instructions (Signed)

## 2022-11-12 NOTE — Progress Notes (Unsigned)
Functional Pain Scale - descriptive words and definitions  Moderate (4)   Constantly aware of pain, can complete ADLs with modification/sleep marginally affected at times/passive distraction is of no use, but active distraction gives some relief. Moderate range order  Average Pain  varies   +Driver, -BT, -Dye Allergies.  Lower back pain on right side, but is also having pain on left side

## 2022-11-13 ENCOUNTER — Other Ambulatory Visit: Payer: Self-pay

## 2022-11-13 ENCOUNTER — Other Ambulatory Visit (HOSPITAL_COMMUNITY): Payer: Self-pay

## 2022-11-13 MED ORDER — VISTA GONIO DRY EYE RELIEF 2.5 % OP SOLN
1.0000 [drp] | Freq: Four times a day (QID) | OPHTHALMIC | 1 refills | Status: DC | PRN
Start: 2022-11-04 — End: 2022-11-13
  Filled 2022-11-13: qty 15, 37d supply, fill #0

## 2022-11-13 MED ORDER — VISTA GONIO DRY EYE RELIEF 2.5 % OP SOLN
1.0000 [drp] | Freq: Four times a day (QID) | OPHTHALMIC | 1 refills | Status: DC | PRN
Start: 2022-11-04 — End: 2023-07-17
  Filled 2022-11-13 – 2023-01-17 (×2): qty 15, 30d supply, fill #0

## 2022-11-14 ENCOUNTER — Other Ambulatory Visit (HOSPITAL_COMMUNITY): Payer: Self-pay

## 2022-11-14 NOTE — Progress Notes (Signed)
Perry Zamora - 62 y.o. male MRN [id number]  Date of birth: [birth year]  Office Visit Note: Visit Date: 11/12/2022 PCP: Georgina Quint, MD Referred by: London Sheer, MD  Subjective: Chief Complaint  Patient presents with   Lower Back - Pain   HPI:  Perry Zamora is a 62 y.o. male who comes in today at the request of Dr. Willia Craze for planned Right L5-S1 Lumbar Transforaminal epidural steroid injection with fluoroscopic guidance.  The patient has failed conservative care including home exercise, medications, time and activity modification.  This injection will be diagnostic and hopefully therapeutic.  Please see requesting physician notes for further details and justification.   ROS Otherwise per HPI.  Assessment & Plan: Visit Diagnoses:    ICD-10-CM   1. Lumbar radiculopathy  M54.16 XR C-ARM NO REPORT    Epidural Steroid injection    methylPREDNISolone acetate (DEPO-MEDROL) injection 80 mg      Plan: No additional findings.   Meds & Orders:  Meds ordered this encounter  Medications   methylPREDNISolone acetate (DEPO-MEDROL) injection 80 mg    Orders Placed This Encounter  Procedures   XR C-ARM NO REPORT   Epidural Steroid injection    Follow-up: Return for visit to requesting provider as needed.   Procedures: No procedures performed  Lumbosacral Transforaminal Epidural Steroid Injection - Sub-Pedicular Approach with Fluoroscopic Guidance  Patient: Perry Zamora      Date of Birth: 01-24-1961 MRN: [id number] PCP: Georgina Quint, MD      Visit Date: 11/12/2022   Universal Protocol:    Date/Time: 11/12/2022  Consent Given By: the patient  Position: PRONE  Additional Comments: Vital signs were monitored before and after the procedure. Patient was prepped and draped in the usual sterile fashion. The correct patient, procedure, and site was verified.   Injection Procedure Details:   Procedure diagnoses: Lumbar radiculopathy [M54.16]     Meds Administered:  Meds ordered this encounter  Medications   methylPREDNISolone acetate (DEPO-MEDROL) injection 80 mg    Laterality: Right  Location/Site: L5  Needle:5.0 in., 22 ga.  Short bevel or Quincke spinal needle  Needle Placement: Transforaminal  Findings:    -Comments: Excellent flow of contrast along the nerve, nerve root and into the epidural space.  Procedure Details: After squaring off the end-plates to get a true AP view, the C-arm was positioned so that an oblique view of the foramen as noted above was visualized. The target area is just inferior to the "nose of the scotty dog" or sub pedicular. The soft tissues overlying this structure were infiltrated with 2-3 ml. of 1% Lidocaine without Epinephrine.  The spinal needle was inserted toward the target using a "trajectory" view along the fluoroscope beam.  Under AP and lateral visualization, the needle was advanced so it did not puncture dura and was located close the 6 O'Clock position of the pedical in AP tracterory. Biplanar projections were used to confirm position. Aspiration was confirmed to be negative for CSF and/or blood. A 1-2 ml. volume of Isovue-250 was injected and flow of contrast was noted at each level. Radiographs were obtained for documentation purposes.   After attaining the desired flow of contrast documented above, a 0.5 to 1.0 ml test dose of 0.25% Marcaine was injected into each respective transforaminal space.  The patient was observed for 90 seconds post injection.  After no sensory deficits were reported, and normal lower extremity motor function was noted,   the above injectate  was administered so that equal amounts of the injectate were placed at each foramen (level) into the transforaminal epidural space.   Additional Comments:  No complications occurred Dressing: 2 x 2 sterile gauze and Band-Aid    Post-procedure details: Patient was observed during the procedure. Post-procedure  instructions were reviewed.  Patient left the clinic in stable condition.    Clinical History: CLINICAL DATA:  Low back pain radiating down bilateral legs first 2 years   EXAM: MRI LUMBAR SPINE WITHOUT CONTRAST   TECHNIQUE: Multiplanar, multisequence MR imaging of the lumbar spine was performed. No intravenous contrast was administered.   COMPARISON:  None Available.   FINDINGS: Segmentation:  Standard.   Alignment:  Physiologic.   Vertebrae: No acute fracture, evidence of discitis, or aggressive bone lesion.   Conus medullaris and cauda equina: Conus extends to the L1 level. Conus and cauda equina appear normal.   Paraspinal and other soft tissues: No acute paraspinal abnormality.   Disc levels:   Disc spaces: Mild disc height loss at L2-3 and L3-4.   T12-L1: 0   L1-L2: No significant disc bulge. No neural foraminal stenosis. No central canal stenosis.   L2-L3: Mild broad-based disc bulge. Mild bilateral facet arthropathy. No foraminal or central canal stenosis.   L3-L4: Mild broad-based disc bulge. Mild bilateral facet arthropathy. No foraminal or central canal stenosis.   L4-L5: Mild broad-based disc bulge. Moderate bilateral facet arthropathy with a small left facet effusion. Mild spinal stenosis. No foraminal stenosis.   L5-S1: Mild broad-based disc bulge. Severe right and mild left facet arthropathy. Mild right foraminal stenosis. No left foraminal stenosis. No spinal stenosis.   IMPRESSION: 1. Lumbar spine spondylosis as described above. 2. No acute osseous injury of the lumbar spine.     Electronically Signed   By: Elige Ko M.D.   On: 10/07/2022 09:27     Objective:  VS:  HT:    WT:   BMI:     BP:(!) 158/107  HR:(!) 108bpm  TEMP: ( )  RESP:  Physical Exam Vitals and nursing note reviewed.  Constitutional:      General: He is not in acute distress.    Appearance: Normal appearance. He is not ill-appearing.  HENT:     Head:  Normocephalic and atraumatic.     Right Ear: External ear normal.     Left Ear: External ear normal.     Nose: No congestion.  Eyes:     Extraocular Movements: Extraocular movements intact.  Cardiovascular:     Rate and Rhythm: Normal rate.     Pulses: Normal pulses.  Pulmonary:     Effort: Pulmonary effort is normal. No respiratory distress.  Abdominal:     General: There is no distension.     Palpations: Abdomen is soft.  Musculoskeletal:        General: No tenderness or signs of injury.     Cervical back: Neck supple.     Right lower leg: No edema.     Left lower leg: No edema.     Comments: Patient has good distal strength without clonus.  Skin:    Findings: No erythema or rash.  Neurological:     General: No focal deficit present.     Mental Status: He is alert and oriented to person, place, and time.     Sensory: No sensory deficit.     Motor: No weakness or abnormal muscle tone.     Coordination: Coordination normal.  Psychiatric:  Mood and Affect: Mood normal.        Behavior: Behavior normal.      Imaging: No results found.

## 2022-11-14 NOTE — Procedures (Signed)
Lumbosacral Transforaminal Epidural Steroid Injection - Sub-Pedicular Approach with Fluoroscopic Guidance  Patient: Perry Zamora      Date of Birth: 21-May-1960 MRN: [id number] PCP: Georgina Quint, MD      Visit Date: 11/12/2022   Universal Protocol:    Date/Time: 11/12/2022  Consent Given By: the patient  Position: PRONE  Additional Comments: Vital signs were monitored before and after the procedure. Patient was prepped and draped in the usual sterile fashion. The correct patient, procedure, and site was verified.   Injection Procedure Details:   Procedure diagnoses: Lumbar radiculopathy [M54.16]    Meds Administered:  Meds ordered this encounter  Medications   methylPREDNISolone acetate (DEPO-MEDROL) injection 80 mg    Laterality: Right  Location/Site: L5  Needle:5.0 in., 22 ga.  Short bevel or Quincke spinal needle  Needle Placement: Transforaminal  Findings:    -Comments: Excellent flow of contrast along the nerve, nerve root and into the epidural space.  Procedure Details: After squaring off the end-plates to get a true AP view, the C-arm was positioned so that an oblique view of the foramen as noted above was visualized. The target area is just inferior to the "nose of the scotty dog" or sub pedicular. The soft tissues overlying this structure were infiltrated with 2-3 ml. of 1% Lidocaine without Epinephrine.  The spinal needle was inserted toward the target using a "trajectory" view along the fluoroscope beam.  Under AP and lateral visualization, the needle was advanced so it did not puncture dura and was located close the 6 O'Clock position of the pedical in AP tracterory. Biplanar projections were used to confirm position. Aspiration was confirmed to be negative for CSF and/or blood. A 1-2 ml. volume of Isovue-250 was injected and flow of contrast was noted at each level. Radiographs were obtained for documentation purposes.   After attaining the desired  flow of contrast documented above, a 0.5 to 1.0 ml test dose of 0.25% Marcaine was injected into each respective transforaminal space.  The patient was observed for 90 seconds post injection.  After no sensory deficits were reported, and normal lower extremity motor function was noted,   the above injectate was administered so that equal amounts of the injectate were placed at each foramen (level) into the transforaminal epidural space.   Additional Comments:  No complications occurred Dressing: 2 x 2 sterile gauze and Band-Aid    Post-procedure details: Patient was observed during the procedure. Post-procedure instructions were reviewed.  Patient left the clinic in stable condition.

## 2022-11-26 ENCOUNTER — Other Ambulatory Visit (HOSPITAL_COMMUNITY): Payer: Self-pay

## 2022-12-02 ENCOUNTER — Ambulatory Visit: Payer: 59 | Admitting: Orthopedic Surgery

## 2022-12-09 ENCOUNTER — Encounter (HOSPITAL_COMMUNITY): Payer: Self-pay | Admitting: Pharmacist

## 2022-12-09 ENCOUNTER — Ambulatory Visit: Payer: 59 | Admitting: Emergency Medicine

## 2022-12-09 ENCOUNTER — Other Ambulatory Visit (HOSPITAL_COMMUNITY): Payer: Self-pay

## 2022-12-09 ENCOUNTER — Encounter: Payer: Self-pay | Admitting: Emergency Medicine

## 2022-12-09 VITALS — BP 118/76 | HR 67 | Temp 98.4°F | Ht 63.0 in | Wt 163.5 lb

## 2022-12-09 DIAGNOSIS — Z13228 Encounter for screening for other metabolic disorders: Secondary | ICD-10-CM | POA: Diagnosis not present

## 2022-12-09 DIAGNOSIS — I10 Essential (primary) hypertension: Secondary | ICD-10-CM

## 2022-12-09 DIAGNOSIS — Z23 Encounter for immunization: Secondary | ICD-10-CM | POA: Diagnosis not present

## 2022-12-09 DIAGNOSIS — Z130 Encounter for screening for diseases of the blood and blood-forming organs and certain disorders involving the immune mechanism: Secondary | ICD-10-CM

## 2022-12-09 DIAGNOSIS — Z0001 Encounter for general adult medical examination with abnormal findings: Secondary | ICD-10-CM

## 2022-12-09 DIAGNOSIS — R399 Unspecified symptoms and signs involving the genitourinary system: Secondary | ICD-10-CM | POA: Diagnosis not present

## 2022-12-09 DIAGNOSIS — Z1211 Encounter for screening for malignant neoplasm of colon: Secondary | ICD-10-CM

## 2022-12-09 DIAGNOSIS — Z13220 Encounter for screening for lipoid disorders: Secondary | ICD-10-CM

## 2022-12-09 DIAGNOSIS — Z0000 Encounter for general adult medical examination without abnormal findings: Secondary | ICD-10-CM

## 2022-12-09 DIAGNOSIS — Z1329 Encounter for screening for other suspected endocrine disorder: Secondary | ICD-10-CM

## 2022-12-09 DIAGNOSIS — K760 Fatty (change of) liver, not elsewhere classified: Secondary | ICD-10-CM | POA: Diagnosis not present

## 2022-12-09 LAB — CBC WITH DIFFERENTIAL/PLATELET
Basophils Absolute: 0.1 10*3/uL (ref 0.0–0.1)
Basophils Relative: 0.9 % (ref 0.0–3.0)
Eosinophils Absolute: 0.1 10*3/uL (ref 0.0–0.7)
Eosinophils Relative: 1.2 % (ref 0.0–5.0)
HCT: 36.4 % — ABNORMAL LOW (ref 39.0–52.0)
Hemoglobin: 12.1 g/dL — ABNORMAL LOW (ref 13.0–17.0)
Lymphocytes Relative: 36.5 % (ref 12.0–46.0)
Lymphs Abs: 2.1 10*3/uL (ref 0.7–4.0)
MCHC: 33.4 g/dL (ref 30.0–36.0)
MCV: 96.8 fl (ref 78.0–100.0)
Monocytes Absolute: 0.5 10*3/uL (ref 0.1–1.0)
Monocytes Relative: 9.7 % (ref 3.0–12.0)
Neutro Abs: 2.9 10*3/uL (ref 1.4–7.7)
Neutrophils Relative %: 51.7 % (ref 43.0–77.0)
Platelets: 461 10*3/uL — ABNORMAL HIGH (ref 150.0–400.0)
RBC: 3.76 Mil/uL — ABNORMAL LOW (ref 4.22–5.81)
RDW: 14 % (ref 11.5–15.5)
WBC: 5.6 10*3/uL (ref 4.0–10.5)

## 2022-12-09 LAB — COMPREHENSIVE METABOLIC PANEL
ALT: 78 U/L — ABNORMAL HIGH (ref 0–53)
AST: 33 U/L (ref 0–37)
Albumin: 4 g/dL (ref 3.5–5.2)
Alkaline Phosphatase: 71 U/L (ref 39–117)
BUN: 9 mg/dL (ref 6–23)
CO2: 30 mEq/L (ref 19–32)
Calcium: 9.5 mg/dL (ref 8.4–10.5)
Chloride: 97 mEq/L (ref 96–112)
Creatinine, Ser: 0.79 mg/dL (ref 0.40–1.50)
GFR: 96.93 mL/min (ref >60.00)
Glucose, Bld: 104 mg/dL — ABNORMAL HIGH (ref 70–99)
Potassium: 4 mEq/L (ref 3.5–5.1)
Sodium: 137 mEq/L (ref 135–145)
Total Bilirubin: 0.5 mg/dL (ref 0.2–1.2)
Total Protein: 7.1 g/dL (ref 6.0–8.3)

## 2022-12-09 LAB — LIPID PANEL
Cholesterol: 185 mg/dL (ref 0–200)
HDL: 50.8 mg/dL (ref >39.00)
LDL Cholesterol: 109 mg/dL — ABNORMAL HIGH (ref 0–99)
NonHDL: 134.65
Total CHOL/HDL Ratio: 4
Triglycerides: 128 mg/dL (ref 0.0–149.0)
VLDL: 25.6 mg/dL (ref 0.0–40.0)

## 2022-12-09 LAB — URINALYSIS, ROUTINE W REFLEX MICROSCOPIC
Bilirubin Urine: NEGATIVE
Ketones, ur: NEGATIVE
Leukocytes,Ua: NEGATIVE
Nitrite: NEGATIVE
Specific Gravity, Urine: 1.015 (ref 1.000–1.030)
Total Protein, Urine: NEGATIVE
Urine Glucose: NEGATIVE
Urobilinogen, UA: 0.2 (ref 0.0–1.0)
pH: 7 (ref 5.0–8.0)

## 2022-12-09 LAB — PSA: PSA: 4.88 ng/mL — ABNORMAL HIGH (ref 0.10–4.00)

## 2022-12-09 LAB — HEMOGLOBIN A1C: Hgb A1c MFr Bld: 5.9 % (ref 4.6–6.5)

## 2022-12-09 MED ORDER — TAMSULOSIN HCL 0.4 MG PO CAPS
0.4000 mg | ORAL_CAPSULE | Freq: Every day | ORAL | 3 refills | Status: DC
Start: 2022-12-09 — End: 2023-04-03
  Filled 2022-12-09 (×2): qty 30, 30d supply, fill #0
  Filled 2022-12-22 – 2023-01-02 (×6): qty 30, 30d supply, fill #1
  Filled 2023-01-09 – 2023-01-27 (×4): qty 30, 30d supply, fill #2
  Filled 2023-03-05: qty 30, 30d supply, fill #3

## 2022-12-09 NOTE — Assessment & Plan Note (Signed)
Active and affecting quality of life Urinalysis and PSA done today No family history of prostate cancer Recommend to start Flomax 0.4 mg at bedtime Recommend urology evaluation Referral placed today.

## 2022-12-09 NOTE — Progress Notes (Signed)
Perry Zamora 62 y.o.   Chief Complaint  Patient presents with   Annual Exam    HISTORY OF PRESENT ILLNESS: This is a 62 y.o. male here for annual exam. Complaining of lower urinary tract symptoms.  Complaining of urinary frequency, nocturia, urgency, and occasional inability to hold urine No other complaints or medical concerns today. BP Readings from Last 3 Encounters:  12/09/22 118/76  11/12/22 (!) 158/107  09/24/22 132/74   Wt Readings from Last 3 Encounters:  12/09/22 163 lb 8 oz (74.2 kg)  09/24/22 159 lb (72.1 kg)  04/26/20 178 lb 6.4 oz (80.9 kg)     HPI   Prior to Admission medications   Medication Sig Start Date End Date Taking? Authorizing Provider  diclofenac (VOLTAREN) 75 MG EC tablet Take 1 tablet (75 mg) by mouth 2 times daily as needed. 10/28/22  Yes Eleni Frank, Eilleen Kempf, MD  escitalopram (LEXAPRO) 10 MG tablet Take 1 tablet (10 mg) by mouth daily. 10/28/22  Yes Jovaun Levene, Eilleen Kempf, MD  hydrochlorothiazide (HYDRODIURIL) 25 MG tablet Take 1 tablet (25 mg) by mouth every morning. 10/28/22  Yes Dea Bitting, Eilleen Kempf, MD  Hypromellose ([place] DRY EYE RELIEF) 2.5 % SOLN Place 1 drop into both eyes 4 (four) times daily as needed for dry eyes 11/04/22  Yes Edrie Ehrich, Eilleen Kempf, MD  losartan (COZAAR) 50 MG tablet Take 1 tablet (50 mg total) by mouth daily. 06/12/22  Yes Gast, Tyler, PA-C  diclofenac (VOLTAREN) 75 MG EC tablet Take 1 tablet  by mouth 2 times daily. Do not take any other NSAIDs including but not limited to ibuprofen, goody powders & aleve while taking this medication. Take with food. Drink plenty of water (at least 6 glasses daily). Start medication after Medrol DosePak 07/03/22       No Known Allergies  Patient Active Problem List   Diagnosis Date Noted   Hepatic steatosis 09/25/2022   Transaminitis 09/25/2022   Chronic right-sided low back pain with right-sided sciatica 09/24/2022   Essential hypertension 09/24/2022   Pulmonary nodule  03/25/2020    Past Medical History:  Diagnosis Date   HTN (hypertension)     No past surgical history on file.  Social History   Socioeconomic History   Marital status: Married    Spouse name: Not on file   Number of children: Not on file   Years of education: Not on file   Highest education level: Not on file  Occupational History   Not on file  Tobacco Use   Smoking status: Never   Smokeless tobacco: Never  Vaping Use   Vaping status: Never Used  Substance and Sexual Activity   Alcohol use: Not Currently    Comment: heavy until Quit 6 months ago   Drug use: Never   Sexual activity: Not on file  Other Topics Concern   Not on file  Social History Narrative   Not on file   Social Determinants of Health   Financial Resource Strain: Low Risk  (12/06/2022)   Overall Financial Resource Strain (CARDIA)    Difficulty of Paying Living Expenses: Not very hard  Food Insecurity: No Food Insecurity (12/06/2022)   Hunger Vital Sign    Worried About Running Out of Food in the Last Year: Never true    Ran Out of Food in the Last Year: Never true  Transportation Needs: No Transportation Needs (12/06/2022)   PRAPARE - Transportation    Lack of Transportation (Medical): No    Lack of  Transportation (Non-Medical): No  Physical Activity: Sufficiently Active (12/06/2022)   Exercise Vital Sign    Days of Exercise per Week: 3 days    Minutes of Exercise per Session: 60 min  Stress: Stress Concern Present (12/06/2022)   [employer] of Occupational Health - Occupational Stress Questionnaire    Feeling of Stress : To some extent  Social Connections: Unknown (12/06/2022)   Social Connection and Isolation Panel [NHANES]    Frequency of Communication with Friends and Family: More than three times a week    Frequency of Social Gatherings with Friends and Family: Patient declined    Attends Religious Services: Patient declined    Database administrator or Organizations: No    Attends  Engineer, structural: Not on file    Marital Status: Married  Intimate Partner Violence: Unknown (07/16/2021)   Received from [facility]   HITS    Physically Hurt: Not on file    Insult or Talk Down To: Not on file    Threaten Physical Harm: Not on file    Scream or Curse: Not on file    No family history on file.   Review of Systems  Constitutional: Negative.  Negative for chills and fever.  HENT: Negative.  Negative for congestion and sore throat.   Respiratory: Negative.  Negative for cough and shortness of breath.   Cardiovascular: Negative.  Negative for chest pain and palpitations.  Gastrointestinal:  Negative for abdominal pain, diarrhea, nausea and vomiting.  Genitourinary:  Positive for frequency and urgency.       Nocturia  Skin: Negative.  Negative for rash.  Neurological: Negative.  Negative for dizziness and headaches.  All other systems reviewed and are negative.   Vitals:   12/09/22 0841  BP: 118/76  Pulse: 67  Temp: 98.4 F (36.9 C)  SpO2: 98%    Physical Exam Vitals reviewed.  Constitutional:      Appearance: Normal appearance.  HENT:     Head: Normocephalic.     Right Ear: Tympanic membrane, ear canal and external ear normal.     Left Ear: Tympanic membrane, ear canal and external ear normal.     Mouth/Throat:     Mouth: Mucous membranes are moist.     Pharynx: Oropharynx is clear.  Eyes:     Extraocular Movements: Extraocular movements intact.     Conjunctiva/sclera: Conjunctivae normal.     Pupils: Pupils are equal, round, and reactive to light.  Cardiovascular:     Rate and Rhythm: Normal rate and regular rhythm.     Pulses: Normal pulses.     Heart sounds: Normal heart sounds.  Pulmonary:     Effort: Pulmonary effort is normal.     Breath sounds: Normal breath sounds.  Abdominal:     Palpations: Abdomen is soft.     Tenderness: There is no abdominal tenderness.  Musculoskeletal:     Cervical back: No tenderness.   Lymphadenopathy:     Cervical: No cervical adenopathy.  Skin:    General: Skin is warm and dry.  Neurological:     General: No focal deficit present.     Mental Status: He is alert and oriented to person, place, and time.  Psychiatric:        Mood and Affect: Mood normal.        Behavior: Behavior normal.      ASSESSMENT & PLAN: Problem List Items Addressed This Visit       Cardiovascular and Mediastinum  Essential hypertension    BP Readings from Last 3 Encounters:  12/09/22 118/76  11/12/22 (!) 158/107  09/24/22 132/74  Well-controlled hypertension Continue losartan 50 mg daily and hydrochlorothiazide 25 mg daily Cardiovascular risks associated with hypertension discussed       Relevant Orders   Comprehensive metabolic panel     Digestive   Hepatic steatosis    Much improved and eating better        Other   Lower urinary tract symptoms    Active and affecting quality of life Urinalysis and PSA done today No family history of prostate cancer Recommend to start Flomax 0.4 mg at bedtime Recommend urology evaluation Referral placed today.      Relevant Medications   tamsulosin (FLOMAX) 0.4 MG CAPS capsule   Other Relevant Orders   PSA(Must document that pt has been informed of limitations of PSA testing.)   Ambulatory referral to Urology   Urinalysis   Other Visit Diagnoses     Encounter for general adult medical examination with abnormal findings    -  Primary   Relevant Orders   CBC with Differential   Comprehensive metabolic panel   Hemoglobin A1c   Lipid panel   PSA(Must document that pt has been informed of limitations of PSA testing.)   Colon cancer screening       Relevant Orders   Cologuard   Screening for deficiency anemia       Relevant Orders   CBC with Differential   Screening for lipoid disorders       Relevant Orders   Lipid panel   Screening for endocrine, metabolic and immunity disorder       Relevant Orders   Comprehensive  metabolic panel   Hemoglobin A1c   Need for vaccination       Relevant Orders   Flu vaccine trivalent PF, 6mos and older(Flulaval,Afluria,Fluarix,Fluzone)      Modifiable risk factors discussed with patient. Anticipatory guidance according to age provided. The following topics were also discussed: Social Determinants of Health Smoking.  Non-smoker Diet and nutrition Benefits of exercise Cancer screening and need for colon cancer screening Vaccinations review and recommendations Cardiovascular risk assessment The 10-year ASCVD risk score (Arnett DK, et al., 2019) is: 5.4%   Values used to calculate the score:     Age: 62 years     Sex: Male     Is Non-Hispanic African American: No     Diabetic: No     Tobacco smoker: No     Systolic Blood Pressure: 118 mmHg     Is BP treated: Yes     HDL Cholesterol: 56.4 mg/dL     Total Cholesterol: 145 mg/dL Evaluation of lower urinary tract symptoms.  Need for medication and urology evaluation Mental health including depression and anxiety Fall and accident prevention  Patient Instructions  Health Maintenance, Male Adopting a healthy lifestyle and getting preventive care are important in promoting health and wellness. Ask your health care provider about: The right schedule for you to have regular tests and exams. Things you can do on your own to prevent diseases and keep yourself healthy. What should I know about diet, weight, and exercise? Eat a healthy diet  Eat a diet that includes plenty of vegetables, fruits, low-fat dairy products, and lean protein. Do not eat a lot of foods that are high in solid fats, added sugars, or sodium. Maintain a healthy weight Body mass index (BMI) is a measurement that can be used to  identify possible weight problems. It estimates body fat based on height and weight. Your health care provider can help determine your BMI and help you achieve or maintain a healthy weight. Get regular exercise Get regular  exercise. This is one of the most important things you can do for your health. Most adults should: Exercise for at least 150 minutes each week. The exercise should increase your heart rate and make you sweat (moderate-intensity exercise). Do strengthening exercises at least twice a week. This is in addition to the moderate-intensity exercise. Spend less time sitting. Even light physical activity can be beneficial. Watch cholesterol and blood lipids Have your blood tested for lipids and cholesterol at 60 years of age, then have this test every 5 years. You may need to have your cholesterol levels checked more often if: Your lipid or cholesterol levels are high. You are older than 60 years of age. You are at high risk for heart disease. What should I know about cancer screening? Many types of cancers can be detected early and may often be prevented. Depending on your health history and family history, you may need to have cancer screening at various ages. This may include screening for: Colorectal cancer. Prostate cancer. Skin cancer. Lung cancer. What should I know about heart disease, diabetes, and high blood pressure? Blood pressure and heart disease High blood pressure causes heart disease and increases the risk of stroke. This is more likely to develop in people who have high blood pressure readings or are overweight. Talk with your health care provider about your target blood pressure readings. Have your blood pressure checked: Every 3-5 years if you are 71-33 years of age. Every year if you are 63 years old or older. If you are between the ages of 48 and 69 and are a current or former smoker, ask your health care provider if you should have a one-time screening for abdominal aortic aneurysm (AAA). Diabetes Have regular diabetes screenings. This checks your fasting blood sugar level. Have the screening done: Once every three years after age 29 if you are at a normal weight and have a  low risk for diabetes. More often and at a younger age if you are overweight or have a high risk for diabetes. What should I know about preventing infection? Hepatitis B If you have a higher risk for hepatitis B, you should be screened for this virus. Talk with your health care provider to find out if you are at risk for hepatitis B infection. Hepatitis C Blood testing is recommended for: Everyone born from [birth year] through 1963. Anyone with known risk factors for hepatitis C. Sexually transmitted infections (STIs) You should be screened each year for STIs, including gonorrhea and chlamydia, if: You are sexually active and are younger than 61 years of age. You are older than 60 years of age and your health care provider tells you that you are at risk for this type of infection. Your sexual activity has changed since you were last screened, and you are at increased risk for chlamydia or gonorrhea. Ask your health care provider if you are at risk. Ask your health care provider about whether you are at high risk for HIV. Your health care provider may recommend a prescription medicine to help prevent HIV infection. If you choose to take medicine to prevent HIV, you should first get tested for HIV. You should then be tested every 3 months for as long as you are taking the medicine. Follow these instructions at  home: Alcohol use Do not drink alcohol if your health care provider tells you not to drink. If you drink alcohol: Limit how much you have to 0-2 drinks a day. Know how much alcohol is in your drink. In the [place] one drink equals one 12 oz bottle of beer (355 mL), one 5 oz glass of wine (148 mL), or one 1 oz glass of hard liquor (44 mL). Lifestyle Do not use any products that contain nicotine or tobacco. These products include cigarettes, chewing tobacco, and vaping devices, such as e-cigarettes. If you need help quitting, ask your health care provider. Do not use street drugs. Do not share  needles. Ask your health care provider for help if you need support or information about quitting drugs. General instructions Schedule regular health, dental, and eye exams. Stay current with your vaccines. Tell your health care provider if: You often feel depressed. You have ever been abused or do not feel safe at home. Summary Adopting a healthy lifestyle and getting preventive care are important in promoting health and wellness. Follow your health care provider's instructions about healthy diet, exercising, and getting tested or screened for diseases. Follow your health care provider's instructions on monitoring your cholesterol and blood pressure. This information is not intended to replace advice given to you by your health care provider. Make sure you discuss any questions you have with your health care provider. Document Revised: 08/21/2020 Document Reviewed: 08/21/2020 Elsevier Patient Education  2024 Elsevier Inc.      Edwina Barth, MD [facility]

## 2022-12-09 NOTE — Patient Instructions (Signed)
Health Maintenance, Male Adopting a healthy lifestyle and getting preventive care are important in promoting health and wellness. Ask your health care provider about: The right schedule for you to have regular tests and exams. Things you can do on your own to prevent diseases and keep yourself healthy. What should I know about diet, weight, and exercise? Eat a healthy diet  Eat a diet that includes plenty of vegetables, fruits, low-fat dairy products, and lean protein. Do not eat a lot of foods that are high in solid fats, added sugars, or sodium. Maintain a healthy weight Body mass index (BMI) is a measurement that can be used to identify possible weight problems. It estimates body fat based on height and weight. Your health care provider can help determine your BMI and help you achieve or maintain a healthy weight. Get regular exercise Get regular exercise. This is one of the most important things you can do for your health. Most adults should: Exercise for at least 150 minutes each week. The exercise should increase your heart rate and make you sweat (moderate-intensity exercise). Do strengthening exercises at least twice a week. This is in addition to the moderate-intensity exercise. Spend less time sitting. Even light physical activity can be beneficial. Watch cholesterol and blood lipids Have your blood tested for lipids and cholesterol at 60 years of age, then have this test every 5 years. You may need to have your cholesterol levels checked more often if: Your lipid or cholesterol levels are high. You are older than 60 years of age. You are at high risk for heart disease. What should I know about cancer screening? Many types of cancers can be detected early and may often be prevented. Depending on your health history and family history, you may need to have cancer screening at various ages. This may include screening for: Colorectal cancer. Prostate cancer. Skin cancer. Lung  cancer. What should I know about heart disease, diabetes, and high blood pressure? Blood pressure and heart disease High blood pressure causes heart disease and increases the risk of stroke. This is more likely to develop in people who have high blood pressure readings or are overweight. Talk with your health care provider about your target blood pressure readings. Have your blood pressure checked: Every 3-5 years if you are 18-39 years of age. Every year if you are 40 years old or older. If you are between the ages of 65 and 75 and are a current or former smoker, ask your health care provider if you should have a one-time screening for abdominal aortic aneurysm (AAA). Diabetes Have regular diabetes screenings. This checks your fasting blood sugar level. Have the screening done: Once every three years after age 45 if you are at a normal weight and have a low risk for diabetes. More often and at a younger age if you are overweight or have a high risk for diabetes. What should I know about preventing infection? Hepatitis B If you have a higher risk for hepatitis B, you should be screened for this virus. Talk with your health care provider to find out if you are at risk for hepatitis B infection. Hepatitis C Blood testing is recommended for: Everyone born from 1945 through 1965. Anyone with known risk factors for hepatitis C. Sexually transmitted infections (STIs) You should be screened each year for STIs, including gonorrhea and chlamydia, if: You are sexually active and are younger than 60 years of age. You are older than 60 years of age and your   health care provider tells you that you are at risk for this type of infection. Your sexual activity has changed since you were last screened, and you are at increased risk for chlamydia or gonorrhea. Ask your health care provider if you are at risk. Ask your health care provider about whether you are at high risk for HIV. Your health care provider  may recommend a prescription medicine to help prevent HIV infection. If you choose to take medicine to prevent HIV, you should first get tested for HIV. You should then be tested every 3 months for as long as you are taking the medicine. Follow these instructions at home: Alcohol use Do not drink alcohol if your health care provider tells you not to drink. If you drink alcohol: Limit how much you have to 0-2 drinks a day. Know how much alcohol is in your drink. In the [place] one drink equals one 12 oz bottle of beer (355 mL), one 5 oz glass of wine (148 mL), or one 1 oz glass of hard liquor (44 mL). Lifestyle Do not use any products that contain nicotine or tobacco. These products include cigarettes, chewing tobacco, and vaping devices, such as e-cigarettes. If you need help quitting, ask your health care provider. Do not use street drugs. Do not share needles. Ask your health care provider for help if you need support or information about quitting drugs. General instructions Schedule regular health, dental, and eye exams. Stay current with your vaccines. Tell your health care provider if: You often feel depressed. You have ever been abused or do not feel safe at home. Summary Adopting a healthy lifestyle and getting preventive care are important in promoting health and wellness. Follow your health care provider's instructions about healthy diet, exercising, and getting tested or screened for diseases. Follow your health care provider's instructions on monitoring your cholesterol and blood pressure. This information is not intended to replace advice given to you by your health care provider. Make sure you discuss any questions you have with your health care provider. Document Revised: 08/21/2020 Document Reviewed: 08/21/2020 Elsevier Patient Education  2024 Elsevier Inc.  

## 2022-12-09 NOTE — Assessment & Plan Note (Signed)
Much improved and eating better

## 2022-12-09 NOTE — Assessment & Plan Note (Signed)
BP Readings from Last 3 Encounters:  12/09/22 118/76  11/12/22 (!) 158/107  09/24/22 132/74  Well-controlled hypertension Continue losartan 50 mg daily and hydrochlorothiazide 25 mg daily Cardiovascular risks associated with hypertension discussed

## 2022-12-10 ENCOUNTER — Other Ambulatory Visit (HOSPITAL_COMMUNITY): Payer: Self-pay

## 2022-12-20 ENCOUNTER — Other Ambulatory Visit (HOSPITAL_COMMUNITY): Payer: Self-pay

## 2022-12-22 ENCOUNTER — Other Ambulatory Visit (HOSPITAL_COMMUNITY): Payer: Self-pay

## 2022-12-23 ENCOUNTER — Other Ambulatory Visit (HOSPITAL_COMMUNITY): Payer: Self-pay

## 2022-12-23 ENCOUNTER — Ambulatory Visit: Payer: 59 | Admitting: Urology

## 2022-12-23 ENCOUNTER — Other Ambulatory Visit: Payer: Self-pay

## 2022-12-25 DIAGNOSIS — Z1211 Encounter for screening for malignant neoplasm of colon: Secondary | ICD-10-CM | POA: Diagnosis not present

## 2022-12-26 ENCOUNTER — Other Ambulatory Visit (HOSPITAL_COMMUNITY): Payer: Self-pay

## 2022-12-26 ENCOUNTER — Other Ambulatory Visit: Payer: Self-pay

## 2022-12-27 ENCOUNTER — Other Ambulatory Visit (HOSPITAL_COMMUNITY): Payer: Self-pay

## 2022-12-30 ENCOUNTER — Other Ambulatory Visit (HOSPITAL_COMMUNITY): Payer: Self-pay

## 2022-12-31 ENCOUNTER — Other Ambulatory Visit (HOSPITAL_COMMUNITY): Payer: Self-pay

## 2022-12-31 ENCOUNTER — Other Ambulatory Visit: Payer: Self-pay | Admitting: Emergency Medicine

## 2022-12-31 DIAGNOSIS — R195 Other fecal abnormalities: Secondary | ICD-10-CM

## 2022-12-31 DIAGNOSIS — Z1211 Encounter for screening for malignant neoplasm of colon: Secondary | ICD-10-CM

## 2022-12-31 LAB — COLOGUARD: COLOGUARD: POSITIVE — AB

## 2023-01-01 ENCOUNTER — Encounter: Payer: Self-pay | Admitting: Emergency Medicine

## 2023-01-01 ENCOUNTER — Ambulatory Visit: Payer: 59 | Admitting: Physician Assistant

## 2023-01-02 ENCOUNTER — Other Ambulatory Visit (HOSPITAL_COMMUNITY): Payer: Self-pay

## 2023-01-02 ENCOUNTER — Encounter: Payer: Self-pay | Admitting: Emergency Medicine

## 2023-01-02 ENCOUNTER — Other Ambulatory Visit: Payer: Self-pay

## 2023-01-02 NOTE — Telephone Encounter (Signed)
That is a question for the GI doctor/office.  Thanks.

## 2023-01-03 ENCOUNTER — Other Ambulatory Visit (HOSPITAL_COMMUNITY): Payer: Self-pay

## 2023-01-09 ENCOUNTER — Other Ambulatory Visit: Payer: Self-pay

## 2023-01-09 ENCOUNTER — Other Ambulatory Visit (HOSPITAL_COMMUNITY): Payer: Self-pay

## 2023-01-17 ENCOUNTER — Other Ambulatory Visit (HOSPITAL_COMMUNITY): Payer: Self-pay

## 2023-01-17 ENCOUNTER — Other Ambulatory Visit: Payer: Self-pay

## 2023-01-20 ENCOUNTER — Other Ambulatory Visit: Payer: Self-pay

## 2023-01-20 ENCOUNTER — Other Ambulatory Visit (HOSPITAL_COMMUNITY): Payer: Self-pay

## 2023-01-21 ENCOUNTER — Other Ambulatory Visit (HOSPITAL_COMMUNITY): Payer: Self-pay

## 2023-01-21 ENCOUNTER — Other Ambulatory Visit: Payer: Self-pay

## 2023-02-13 ENCOUNTER — Encounter: Payer: Self-pay | Admitting: Gastroenterology

## 2023-02-13 ENCOUNTER — Ambulatory Visit: Payer: 59 | Admitting: Gastroenterology

## 2023-02-13 ENCOUNTER — Telehealth: Payer: Self-pay | Admitting: Gastroenterology

## 2023-02-13 NOTE — Telephone Encounter (Signed)
Good Morning Perry Zamora,   Patient called stating that he would not be able to come in for his appointment with you today at 9:30 due to having to go to work.   Patient stated he was unsure as to why he needed to come in before his procedure because he was not having any issues. I advised patient in the appointment notes he wanted to discuss EGD and colon. Patient stated he only wanted to have a colonoscopy at this time.  Patient was scheduled for colonoscopy on 12/17 as requested.

## 2023-03-04 ENCOUNTER — Other Ambulatory Visit (HOSPITAL_COMMUNITY): Payer: Self-pay

## 2023-03-07 ENCOUNTER — Other Ambulatory Visit (HOSPITAL_COMMUNITY): Payer: Self-pay

## 2023-03-17 ENCOUNTER — Other Ambulatory Visit: Payer: Self-pay | Admitting: *Deleted

## 2023-03-18 ENCOUNTER — Ambulatory Visit (AMBULATORY_SURGERY_CENTER): Payer: 59

## 2023-03-18 ENCOUNTER — Other Ambulatory Visit (HOSPITAL_COMMUNITY): Payer: Self-pay

## 2023-03-18 VITALS — Ht 63.0 in | Wt 165.0 lb

## 2023-03-18 DIAGNOSIS — R195 Other fecal abnormalities: Secondary | ICD-10-CM

## 2023-03-18 DIAGNOSIS — Z1211 Encounter for screening for malignant neoplasm of colon: Secondary | ICD-10-CM

## 2023-03-18 MED ORDER — NA SULFATE-K SULFATE-MG SULF 17.5-3.13-1.6 GM/177ML PO SOLN
1.0000 | Freq: Once | ORAL | 0 refills | Status: AC
Start: 2023-03-18 — End: 2023-03-18
  Filled 2023-03-18 (×2): qty 354, 1d supply, fill #0

## 2023-03-18 NOTE — Progress Notes (Signed)

## 2023-03-19 ENCOUNTER — Ambulatory Visit: Payer: 59 | Admitting: Urology

## 2023-03-19 ENCOUNTER — Encounter: Payer: Self-pay | Admitting: Urology

## 2023-03-19 ENCOUNTER — Encounter: Payer: Self-pay | Admitting: Gastroenterology

## 2023-03-19 VITALS — BP 150/83 | HR 71 | Ht 63.0 in | Wt 165.0 lb

## 2023-03-19 DIAGNOSIS — R972 Elevated prostate specific antigen [PSA]: Secondary | ICD-10-CM | POA: Diagnosis not present

## 2023-03-19 DIAGNOSIS — R399 Unspecified symptoms and signs involving the genitourinary system: Secondary | ICD-10-CM | POA: Diagnosis not present

## 2023-03-19 LAB — URINALYSIS, ROUTINE W REFLEX MICROSCOPIC
Bilirubin, UA: NEGATIVE
Glucose, UA: NEGATIVE
Ketones, UA: NEGATIVE
Nitrite, UA: NEGATIVE
Protein,UA: NEGATIVE
Specific Gravity, UA: 1.01 (ref 1.005–1.030)
Urobilinogen, Ur: 0.2 mg/dL (ref 0.2–1.0)
pH, UA: 7 (ref 5.0–7.5)

## 2023-03-19 LAB — MICROSCOPIC EXAMINATION

## 2023-03-19 LAB — BLADDER SCAN AMB NON-IMAGING

## 2023-03-19 NOTE — Progress Notes (Addendum)
Assessment: 1. Lower urinary tract symptoms   2. Elevated PSA      Plan: Today I had a long discussion with the patient regarding his lower urinary tract symptoms and elevated PSA and further options for evaluation and management. Will obtain iso-PSA today Continue tamsulosin Follow-up 2 to 3 weeks to review results and make further recommendations.  Briefly discussed biopsy if indicated and also potential initiation of combination therapy with adding 5 ARI.    Chief Complaint: Lower urinary tract symptoms/elevated PSA  History of Present Illness:  Perry Zamora is a 62 y.o. male who is seen in consultation from Georgina Quint, MD for evaluation of BPH/LUTS and elevated PSA.  Patient reports a several year history of progressive worsening lower urinary tract symptoms.  No other prior GU history. Patient was started on tamsulosin 0.4 mg daily by his PCP 11/2022.  He reports that this has helped significantly.    Current IPSS = 19 PVR = 75 mL  PSA data: 05/2022  10.52 11/2022  4.88  There is no known family history of prostate cancer.  DRE today reveals a large 50 to 60 g gland without nodules or induration     Past Medical History:  Past Medical History:  Diagnosis Date   HTN (hypertension)     Past Surgical History:  History reviewed. No pertinent surgical history.  Allergies:  No Known Allergies  Family History:  Family History  Problem Relation Age of Onset   Colon cancer Neg Hx    Colon polyps Neg Hx    Esophageal cancer Neg Hx    Rectal cancer Neg Hx    Stomach cancer Neg Hx     Social History:  Social History   Tobacco Use   Smoking status: Never   Smokeless tobacco: Never  Vaping Use   Vaping status: Never Used  Substance Use Topics   Alcohol use: Not Currently    Comment: heavy until Quit 6 months ago   Drug use: Never    Review of symptoms:  Constitutional:  Negative for unexplained weight loss, night sweats, fever, chills ENT:   Negative for nose bleeds, sinus pain, painful swallowing CV:  Negative for chest pain, shortness of breath, exercise intolerance, palpitations, loss of consciousness Resp:  Negative for cough, wheezing, shortness of breath GI:  Negative for nausea, vomiting, diarrhea, bloody stools GU:  Positives noted in HPI; otherwise negative for gross hematuria, dysuria, urinary incontinence Neuro:  Negative for seizures, poor balance, limb weakness, slurred speech Psych:  Negative for lack of energy, depression, anxiety Endocrine:  Negative for polydipsia, polyuria, symptoms of hypoglycemia (dizziness, hunger, sweating) Hematologic:  Negative for anemia, purpura, petechia, prolonged or excessive bleeding, use of anticoagulants  Allergic:  Negative for difficulty breathing or choking as a result of exposure to anything; no shellfish allergy; no allergic response (rash/itch) to materials, foods  Physical exam: BP (!) 150/83   Pulse 71   Ht 5\' 3"  (1.6 m)   Wt 165 lb (74.8 kg)   BMI 29.23 kg/m  GENERAL APPEARANCE:  Well appearing, well developed, well nourished, NAD   GU: Normal external genitalia DRE: Normal sphincter tone; prostate is large 50 to 60 g without evidence of nodules or induration  Results: Results for orders placed or performed in visit on 03/19/23 (from the past 24 hour(s))  Microscopic Examination   Collection Time: 03/19/23 12:00 AM   Urine  Result Value Ref Range   WBC, UA 0-5 0 - 5 /hpf  RBC, Urine 0-2 0 - 2 /hpf   Epithelial Cells (non renal) 0-10 0 - 10 /hpf   Bacteria, UA Few (A) None seen/Few  Urinalysis, Routine w reflex microscopic   Collection Time: 03/19/23 12:00 AM  Result Value Ref Range   Specific Gravity, UA 1.010 1.005 - 1.030   pH, UA 7.0 5.0 - 7.5   Color, UA Yellow Yellow   Appearance Ur Clear Clear   Leukocytes,UA Trace (A) Negative   Protein,UA Negative Negative/Trace   Glucose, UA Negative Negative   Ketones, UA Negative Negative   RBC, UA 2+ (A)  Negative   Bilirubin, UA Negative Negative   Urobilinogen, Ur 0.2 0.2 - 1.0 mg/dL   Nitrite, UA Negative Negative   Microscopic Examination See below:   BLADDER SCAN AMB NON-IMAGING   Collection Time: 03/19/23 12:00 AM  Result Value Ref Range   Scan Result 75ml

## 2023-03-20 ENCOUNTER — Telehealth: Payer: Self-pay | Admitting: Gastroenterology

## 2023-03-20 ENCOUNTER — Ambulatory Visit: Payer: 59 | Admitting: Gastroenterology

## 2023-03-20 NOTE — Telephone Encounter (Signed)
Inbound call from patient's wife requesting a call to discuss coverage and out of pocket cost for 12/17 colonoscopy. Please advise, thank you.

## 2023-03-24 ENCOUNTER — Telehealth: Payer: Self-pay | Admitting: Gastroenterology

## 2023-03-24 DIAGNOSIS — Z1211 Encounter for screening for malignant neoplasm of colon: Secondary | ICD-10-CM

## 2023-03-24 NOTE — Telephone Encounter (Signed)
Inbound call from patient wishing to speak with a nurse regarding questions he has about upcoming colonoscopy. Please advise, thank you.

## 2023-03-24 NOTE — Telephone Encounter (Signed)
Attempted to reach pt to answser her questions. LM with call back #

## 2023-03-24 NOTE — Telephone Encounter (Signed)
2nd attempt to reach pt to answer questions. LM with call back #. Will try other # in profile Attempt to reach pt on other # listed in profile but unable to reach or LM.

## 2023-03-25 ENCOUNTER — Other Ambulatory Visit (HOSPITAL_COMMUNITY): Payer: Self-pay

## 2023-03-25 MED ORDER — NA SULFATE-K SULFATE-MG SULF 17.5-3.13-1.6 GM/177ML PO SOLN
1.0000 | Freq: Once | ORAL | 0 refills | Status: AC
Start: 2023-03-25 — End: 2023-03-26
  Filled 2023-03-25: qty 354, 1d supply, fill #0

## 2023-03-25 NOTE — Addendum Note (Signed)
Addended by: Danton Sewer on: 03/25/2023 09:51 AM   Modules accepted: Orders

## 2023-03-25 NOTE — Telephone Encounter (Signed)
Patient wife called and stated that she was wondering where and when was her husband Prep sent to, because she does not know where it was sent to and there was notification about the prep being sent. Patient wife stated that her husband had his pre-op on December 3rd. Please advise.

## 2023-03-25 NOTE — Telephone Encounter (Signed)
Patient is returning the call. Requested a call back.

## 2023-03-25 NOTE — Telephone Encounter (Signed)
Attempt to reach pt  to answer questions. LM letting pt know prep was sent to pharmacy listed in profile and to call if has any questions. Call back # given

## 2023-03-27 ENCOUNTER — Encounter: Payer: Self-pay | Admitting: Urology

## 2023-04-01 ENCOUNTER — Encounter: Payer: Self-pay | Admitting: Gastroenterology

## 2023-04-01 ENCOUNTER — Ambulatory Visit (AMBULATORY_SURGERY_CENTER): Payer: 59 | Admitting: Gastroenterology

## 2023-04-01 VITALS — BP 144/84 | HR 79 | Temp 98.4°F | Resp 16 | Ht 63.0 in | Wt 165.0 lb

## 2023-04-01 DIAGNOSIS — K641 Second degree hemorrhoids: Secondary | ICD-10-CM | POA: Diagnosis not present

## 2023-04-01 DIAGNOSIS — K5730 Diverticulosis of large intestine without perforation or abscess without bleeding: Secondary | ICD-10-CM | POA: Diagnosis not present

## 2023-04-01 DIAGNOSIS — K648 Other hemorrhoids: Secondary | ICD-10-CM

## 2023-04-01 DIAGNOSIS — Z1211 Encounter for screening for malignant neoplasm of colon: Secondary | ICD-10-CM

## 2023-04-01 DIAGNOSIS — I10 Essential (primary) hypertension: Secondary | ICD-10-CM | POA: Diagnosis not present

## 2023-04-01 DIAGNOSIS — R195 Other fecal abnormalities: Secondary | ICD-10-CM | POA: Diagnosis not present

## 2023-04-01 MED ORDER — SODIUM CHLORIDE 0.9 % IV SOLN: 500.0000 mL | Freq: Once | INTRAVENOUS | Status: DC

## 2023-04-01 NOTE — Progress Notes (Signed)
GASTROENTEROLOGY PROCEDURE H&P NOTE   Primary Care Physician: Georgina Quint, MD    Reason for Procedure:  Colon Cancer screening  Plan:    Colonoscopy  Patient is appropriate for endoscopic procedure(s) in the ambulatory ([facility]) setting.  The nature of the procedure, as well as the risks, benefits, and alternatives were carefully and thoroughly reviewed with the patient. Ample time for discussion and questions allowed. The patient understood, was satisfied, and agreed to proceed.     HPI: Perry Zamora is a 62 y.o. male who presents for colonoscopy for routine Colon Cancer screening.  No active GI symptoms.    Last colonoscopy was 02/2014 at [facility] and notable for  inadequate prep, recommended repeat in 5 years.  Internal hemorrhoids.  Path results from biopsies taken throughout the colon were benign.   Past Medical History:  Diagnosis Date   HTN (hypertension)     No past surgical history on file.  Prior to Admission medications   Medication Sig Start Date End Date Taking? Authorizing Provider  hydrochlorothiazide (HYDRODIURIL) 25 MG tablet Take 1 tablet (25 mg total) by mouth every morning. 10/28/22  Yes Sagardia, Eilleen Kempf, MD  Hypromellose (VISTA GONIO DRY EYE RELIEF) 2.5 % SOLN Place 1 drop into both eyes 4 (four) times daily as needed for dry eyes 11/04/22  Yes Sagardia, Eilleen Kempf, MD  losartan (COZAAR) 50 MG tablet Take 1 tablet (50 mg total) by mouth daily. 06/12/22  Yes Gast, Joselyn Glassman, PA-C  tamsulosin (FLOMAX) 0.4 MG CAPS capsule Take 1 capsule (0.4 mg total) by mouth daily. 12/09/22  Yes Sagardia, Eilleen Kempf, MD  diclofenac (VOLTAREN) 75 MG EC tablet Take 1 tablet (75 mg) by mouth 2 times daily as needed. 10/28/22   Georgina Quint, MD  diclofenac (VOLTAREN) 75 MG EC tablet Take 1 tablet  by mouth 2 times daily. Do not take any other NSAIDs including but not limited to ibuprofen, goody powders & aleve while taking this medication. Take with food.  Drink plenty of water (at least 6 glasses daily). Start medication after Medrol DosePak 07/03/22     escitalopram (LEXAPRO) 10 MG tablet Take 1 tablet (10 mg) by mouth daily. 10/28/22   Georgina Quint, MD    Current Outpatient Medications  Medication Sig Dispense Refill   hydrochlorothiazide (HYDRODIURIL) 25 MG tablet Take 1 tablet (25 mg total) by mouth every morning. 90 tablet 3   Hypromellose (VISTA GONIO DRY EYE RELIEF) 2.5 % SOLN Place 1 drop into both eyes 4 (four) times daily as needed for dry eyes 15 mL 1   losartan (COZAAR) 50 MG tablet Take 1 tablet (50 mg total) by mouth daily. 90 tablet 3   tamsulosin (FLOMAX) 0.4 MG CAPS capsule Take 1 capsule (0.4 mg total) by mouth daily. 30 capsule 3   diclofenac (VOLTAREN) 75 MG EC tablet Take 1 tablet (75 mg) by mouth 2 times daily as needed. 30 tablet 1   diclofenac (VOLTAREN) 75 MG EC tablet Take 1 tablet  by mouth 2 times daily. Do not take any other NSAIDs including but not limited to ibuprofen, goody powders & aleve while taking this medication. Take with food. Drink plenty of water (at least 6 glasses daily). Start medication after Medrol DosePak 60 tablet 2   escitalopram (LEXAPRO) 10 MG tablet Take 1 tablet (10 mg) by mouth daily. 90 tablet 1   Current Facility-Administered Medications  Medication Dose Route Frequency Provider Last Rate Last Admin   0.9 %  sodium chloride infusion  500 mL Intravenous Once Secundino Ellithorpe V, DO        Allergies as of 04/01/2023   (No Known Allergies)    Family History  Problem Relation Age of Onset   Colon cancer Neg Hx    Colon polyps Neg Hx    Esophageal cancer Neg Hx    Rectal cancer Neg Hx    Stomach cancer Neg Hx     Social History   Socioeconomic History   Marital status: Married    Spouse name: Not on file   Number of children: Not on file   Years of education: Not on file   Highest education level: Not on file  Occupational History   Not on file  Tobacco Use   Smoking  status: Never   Smokeless tobacco: Never  Vaping Use   Vaping status: Never Used  Substance and Sexual Activity   Alcohol use: Not Currently    Comment: heavy until Quit 6 months ago   Drug use: Never   Sexual activity: Not on file  Other Topics Concern   Not on file  Social History Narrative   Not on file   Social Drivers of Health   Financial Resource Strain: Low Risk  (12/06/2022)   Overall Financial Resource Strain (CARDIA)    Difficulty of Paying Living Expenses: Not very hard  Food Insecurity: No Food Insecurity (12/06/2022)   Hunger Vital Sign    Worried About Running Out of Food in the Last Year: Never true    Ran Out of Food in the Last Year: Never true  Transportation Needs: No Transportation Needs (12/06/2022)   PRAPARE - Administrator, Civil Service (Medical): No    Lack of Transportation (Non-Medical): No  Physical Activity: Sufficiently Active (12/06/2022)   Exercise Vital Sign    Days of Exercise per Week: 3 days    Minutes of Exercise per Session: 60 min  Stress: Stress Concern Present (12/06/2022)   [employer] of Occupational Health - Occupational Stress Questionnaire    Feeling of Stress : To some extent  Social Connections: Unknown (12/06/2022)   Social Connection and Isolation Panel [NHANES]    Frequency of Communication with Friends and Family: More than three times a week    Frequency of Social Gatherings with Friends and Family: Patient declined    Attends Religious Services: Patient declined    Database administrator or Organizations: No    Attends Engineer, structural: Not on file    Marital Status: Married  Intimate Partner Violence: Unknown (07/16/2021)   Received from [employer], [facility]   HITS    Physically Hurt: Not on file    Insult or Talk Down To: Not on file    Threaten Physical Harm: Not on file    Scream or Curse: Not on file    Physical Exam: Vital signs in last 24 hours: @BP  106/67   Pulse 93    Temp 98.4 F (36.9 C)   Ht 5\' 3"  (1.6 m)   Wt 165 lb (74.8 kg)   BMI 29.23 kg/m  GEN: NAD EYE: Sclerae anicteric ENT: MMM CV: Non-tachycardic Pulm: CTA b/l GI: Soft, NT/ND NEURO:  Alert & Oriented x 3   Doristine Locks, DO [facility]   04/01/2023 7:40 AM

## 2023-04-01 NOTE — Patient Instructions (Signed)
Please read handouts provided. Continue present medications. Repeat colonoscopy in 10 years for screening. Consider a fiber supplement.   YOU HAD AN ENDOSCOPIC PROCEDURE TODAY AT [facility]:   Refer to the procedure report that was given to you for any specific questions about what was found during the examination.  If the procedure report does not answer your questions, please call your gastroenterologist to clarify.  If you requested that your care partner not be given the details of your procedure findings, then the procedure report has been included in a sealed envelope for you to review at your convenience later.  YOU SHOULD EXPECT: Some feelings of bloating in the abdomen. Passage of more gas than usual.  Walking can help get rid of the air that was put into your GI tract during the procedure and reduce the bloating. If you had a lower endoscopy (such as a colonoscopy or flexible sigmoidoscopy) you may notice spotting of blood in your stool or on the toilet paper. If you underwent a bowel prep for your procedure, you may not have a normal bowel movement for a few days.  Please Note:  You might notice some irritation and congestion in your nose or some drainage.  This is from the oxygen used during your procedure.  There is no need for concern and it should clear up in a day or so.  SYMPTOMS TO REPORT IMMEDIATELY:  Following lower endoscopy (colonoscopy or flexible sigmoidoscopy):  Excessive amounts of blood in the stool  Significant tenderness or worsening of abdominal pains  Swelling of the abdomen that is new, acute  Fever of 100F or higher.  For urgent or emergent issues, a gastroenterologist can be reached at any hour by calling [phone]. Do not use MyChart messaging for urgent concerns.    DIET:  We do recommend a small meal at first, but then you may proceed to your regular diet.  Drink plenty of fluids but you should avoid alcoholic beverages for 24  hours.  ACTIVITY:  You should plan to take it easy for the rest of today and you should NOT DRIVE or use heavy machinery until tomorrow (because of the sedation medicines used during the test).    FOLLOW UP: Our staff will call the number listed on your records the next business day following your procedure.  We will call around 7:15- 8:00 am to check on you and address any questions or concerns that you may have regarding the information given to you following your procedure. If we do not reach you, we will leave a message.     If any biopsies were taken you will be contacted by phone or by letter within the next 1-3 weeks.  Please call us at [phone] if you have not heard about the biopsies in 3 weeks.    SIGNATURES/CONFIDENTIALITY: You and/or your care partner have signed paperwork which will be entered into your electronic medical record.  These signatures attest to the fact that that the information above on your After Visit Summary has been reviewed and is understood.  Full responsibility of the confidentiality of this discharge information lies with you and/or your care-partner.

## 2023-04-01 NOTE — Op Note (Signed)
[facility] Patient Name: Perry Zamora Procedure Date: 04/01/2023 8:03 AM MRN: [id number] Endoscopist: Doristine Locks , MD, [id number] Age: 62 Referring MD:  Date of Birth: 02/24/1961 Gender: Male Account #: [id number] Procedure:                Colonoscopy Indications:              Screening for colorectal malignant neoplasm                           Last colonoscopy was 02/2014 at [facility] and                            notable for inadequate prep. Medicines:                Monitored Anesthesia Care Procedure:                Pre-Anesthesia Assessment:                           - Prior to the procedure, a History and Physical                            was performed, and patient medications and                            allergies were reviewed. The patient's tolerance of                            previous anesthesia was also reviewed. The risks                            and benefits of the procedure and the sedation                            options and risks were discussed with the patient.                            All questions were answered, and informed consent                            was obtained. Prior Anticoagulants: The patient has                            taken no anticoagulant or antiplatelet agents. ASA                            Grade Assessment: II - A patient with mild systemic                            disease. After reviewing the risks and benefits,                            the patient was deemed in satisfactory condition to  undergo the procedure.                           After obtaining informed consent, the colonoscope                            was passed under direct vision. Throughout the                            procedure, the patient's blood pressure, pulse, and                            oxygen saturations were monitored continuously. The                            Olympus CF-HQ190L ([id number])  Colonoscope was                            introduced through the anus and advanced to the the                            terminal ileum. The colonoscopy was performed                            without difficulty. The patient tolerated the                            procedure well. The quality of the bowel                            preparation was good. The terminal ileum, ileocecal                            valve, appendiceal orifice, and rectum were                            photographed. Scope In: 8:07:12 AM Scope Out: 8:21:36 AM Scope Withdrawal Time: 0 hours 12 minutes 7 seconds  Total Procedure Duration: 0 hours 14 minutes 24 seconds  Findings:                 The perianal and digital rectal examinations were                            normal.                           Multiple small-mouthed diverticula were found in                            the sigmoid colon, descending colon, transverse                            colon and ascending colon.  Non-bleeding internal hemorrhoids were found during                            retroflexion. The hemorrhoids were small.                           The terminal ileum appeared normal. Complications:            No immediate complications. Estimated Blood Loss:     Estimated blood loss: none. Impression:               - Diverticulosis in the sigmoid colon, in the                            descending colon, in the transverse colon and in                            the ascending colon.                           - Non-bleeding internal hemorrhoids.                           - The examined portion of the ileum was normal.                           - No specimens collected. Recommendation:           - Patient has a contact number available for                            emergencies. The signs and symptoms of potential                            delayed complications were discussed with the                             patient. Return to normal activities tomorrow.                            Written discharge instructions were provided to the                            patient.                           - Resume previous diet.                           - Continue present medications.                           - Repeat colonoscopy in 10 years for screening                            purposes.                           -  Use fiber, for example Citrucel, Fibercon, Konsyl                            or Metamucil.                           - Internal hemorrhoids were noted on this study and                            may be amenable to hemorrhoid band ligation. If you                            are interested in further treatment of these                            hemorrhoids with band ligation, please contact my                            clinic to set up an appointment for evaluation and                            treatment. Doristine Locks, MD 04/01/2023 8:26:24 AM

## 2023-04-01 NOTE — Progress Notes (Signed)
Pt's states no medical or surgical changes since previsit or office visit. 

## 2023-04-01 NOTE — Progress Notes (Signed)
Vss nad trans to pacu 

## 2023-04-02 ENCOUNTER — Telehealth: Payer: Self-pay

## 2023-04-02 NOTE — Telephone Encounter (Signed)
  Follow up Call-     04/01/2023    7:16 AM  Call back number  Post procedure Call Back phone  # [phone]  Permission to leave phone message Yes     Patient questions:  Do you have a fever, pain , or abdominal swelling? No. Pain Score  0 *  Have you tolerated food without any problems? Yes.    Have you been able to return to your normal activities? Yes.    Do you have any questions about your discharge instructions: Diet   No. Medications  No. Follow up visit  No.  Do you have questions or concerns about your Care? No.  Actions: * If pain score is 4 or above: No action needed, pain <4.

## 2023-04-03 ENCOUNTER — Other Ambulatory Visit (HOSPITAL_COMMUNITY): Payer: Self-pay

## 2023-04-03 ENCOUNTER — Telehealth: Payer: Self-pay | Admitting: Urology

## 2023-04-03 ENCOUNTER — Other Ambulatory Visit: Payer: Self-pay | Admitting: Emergency Medicine

## 2023-04-03 DIAGNOSIS — R399 Unspecified symptoms and signs involving the genitourinary system: Secondary | ICD-10-CM

## 2023-04-03 MED ORDER — TAMSULOSIN HCL 0.4 MG PO CAPS
0.4000 mg | ORAL_CAPSULE | Freq: Every day | ORAL | 3 refills | Status: DC
  Filled 2023-04-03 – 2023-04-04 (×2): qty 90, 90d supply, fill #0
  Filled 2023-04-19 – 2023-06-30 (×3): qty 90, 90d supply, fill #1
  Filled 2023-07-30 – 2023-08-18 (×2): qty 90, 90d supply, fill #2
  Filled 2023-09-25: qty 90, 90d supply, fill #0
  Filled 2023-09-25: qty 90, 90d supply, fill #2
  Filled 2023-11-07 – 2023-11-14 (×2): qty 90, 90d supply, fill #1

## 2023-04-03 NOTE — Telephone Encounter (Signed)
Calling regarding blood work results.

## 2023-04-03 NOTE — Telephone Encounter (Signed)
Pt aware that the ISOPSA came back elevated. Verified follow up appt date/time. Pt aware that next steps will be discussed at follow up.

## 2023-04-04 ENCOUNTER — Other Ambulatory Visit (HOSPITAL_COMMUNITY): Payer: Self-pay

## 2023-04-04 ENCOUNTER — Other Ambulatory Visit: Payer: Self-pay

## 2023-04-04 ENCOUNTER — Other Ambulatory Visit: Payer: Self-pay | Admitting: Emergency Medicine

## 2023-04-04 ENCOUNTER — Encounter: Payer: Self-pay | Admitting: Emergency Medicine

## 2023-04-04 MED ORDER — LOSARTAN POTASSIUM 50 MG PO TABS
50.0000 mg | ORAL_TABLET | Freq: Every day | ORAL | 3 refills | Status: DC
  Filled 2023-04-04 – 2023-04-19 (×2): qty 90, 90d supply, fill #0
  Filled 2023-05-26 – 2023-07-30 (×3): qty 90, 90d supply, fill #1
  Filled 2023-08-18 – 2023-09-25 (×2): qty 90, 90d supply, fill #2

## 2023-04-15 ENCOUNTER — Ambulatory Visit: Payer: 59 | Admitting: Urology

## 2023-04-15 ENCOUNTER — Encounter: Payer: Self-pay | Admitting: Urology

## 2023-04-15 VITALS — BP 139/85 | HR 78 | Ht 63.0 in | Wt 160.0 lb

## 2023-04-15 DIAGNOSIS — R3129 Other microscopic hematuria: Secondary | ICD-10-CM

## 2023-04-15 DIAGNOSIS — R399 Unspecified symptoms and signs involving the genitourinary system: Secondary | ICD-10-CM | POA: Diagnosis not present

## 2023-04-15 DIAGNOSIS — R972 Elevated prostate specific antigen [PSA]: Secondary | ICD-10-CM

## 2023-04-15 LAB — URINALYSIS, ROUTINE W REFLEX MICROSCOPIC
Bilirubin, UA: NEGATIVE
Glucose, UA: NEGATIVE
Ketones, UA: NEGATIVE
Leukocytes,UA: NEGATIVE
Nitrite, UA: NEGATIVE
Protein,UA: NEGATIVE
Specific Gravity, UA: 1.015 (ref 1.005–1.030)
Urobilinogen, Ur: 0.2 mg/dL (ref 0.2–1.0)
pH, UA: 7 (ref 5.0–7.5)

## 2023-04-15 LAB — BASIC METABOLIC PANEL
BUN/Creatinine Ratio: 14 (ref 10–24)
BUN: 11 mg/dL (ref 8–27)
CO2: 23 mmol/L (ref 20–29)
Calcium: 9.6 mg/dL (ref 8.6–10.2)
Chloride: 95 mmol/L — ABNORMAL LOW (ref 96–106)
Creatinine, Ser: 0.8 mg/dL (ref 0.76–1.27)
Glucose: 88 mg/dL (ref 70–99)
Potassium: 4.1 mmol/L (ref 3.5–5.2)
Sodium: 134 mmol/L (ref 134–144)
eGFR: 101 mL/min/{1.73_m2} (ref >59)

## 2023-04-15 LAB — MICROSCOPIC EXAMINATION

## 2023-04-15 NOTE — Progress Notes (Signed)
   Assessment: 1. Elevated PSA   2. Other microscopic hematuria   3. Lower urinary tract symptoms (LUTS)     Plan: Today I had a long discussion with the patient regarding his multiple urologic issues.  He will continue tamsulosin  for his lower urinary tract symptoms. Discussed evaluation of elevated PSA with option of prostate imaging and biopsy.  We discussed this previously as well.  Patient elects to proceed with TRUS/BX  Also discussed significant microscopic hematuria and current recommendations for evaluation including upper tract imaging and cystoscopy.  Will obtain BMP today and schedule CT.  Chief Complaint: FU blood work  HPI: Perry Zamora is a 62 y.o. male who presents for continued evaluation of BPH/lower urinary tract symptoms, elevated PSA, and microscopic hematuria.  No other prior GU history. Patient was started on tamsulosin  0.4 mg daily by his PCP 11/2022.  He reports that this has helped significantly.     Current IPSS = 17 PVR = 75 mL  UA today continues to show significant microhematuria (3-10 RBC/hpf)   PSA data: 05/2022             10.52 11/2022             4.88 03/2023  11.74  iso psa 8.7.     There is no known family history of prostate cancer.   DRE today reveals a large 50 to 60 g gland without nodules or induration   Portions of the above documentation were copied from a prior visit for review purposes only.  Allergies: No Known Allergies  PMH: Past Medical History:  Diagnosis Date   HTN (hypertension)     PSH: No past surgical history on file.  SH: Social History   Tobacco Use   Smoking status: Never   Smokeless tobacco: Never  Vaping Use   Vaping status: Never Used  Substance Use Topics   Alcohol use: Not Currently    Comment: heavy until Quit 6 months ago   Drug use: Never    ROS: Constitutional:  Negative for fever, chills, weight loss CV: Negative for chest pain, previous MI, hypertension Respiratory:  Negative for  shortness of breath, wheezing, sleep apnea, frequent cough GI:  Negative for nausea, vomiting, bloody stool, GERD  PE: BP 139/85   Pulse 78   Ht 5' 3 (1.6 m)   Wt 160 lb (72.6 kg)   BMI 28.34 kg/m  GENERAL APPEARANCE:  Well appearing, well developed, well nourished, NAD    Results: UA continues to show significant microscopic hematuria

## 2023-04-19 ENCOUNTER — Other Ambulatory Visit: Payer: Self-pay

## 2023-04-19 ENCOUNTER — Other Ambulatory Visit (HOSPITAL_COMMUNITY): Payer: Self-pay

## 2023-04-21 ENCOUNTER — Other Ambulatory Visit (HOSPITAL_COMMUNITY): Payer: Self-pay

## 2023-04-21 ENCOUNTER — Other Ambulatory Visit: Payer: Self-pay

## 2023-04-23 ENCOUNTER — Ambulatory Visit: Payer: 59 | Admitting: Gastroenterology

## 2023-04-25 ENCOUNTER — Ambulatory Visit (HOSPITAL_BASED_OUTPATIENT_CLINIC_OR_DEPARTMENT_OTHER)
Admission: RE | Admit: 2023-04-25 | Discharge: 2023-04-25 | Disposition: A | Discharge status: Home / Self Care | Payer: Commercial Managed Care - PPO | Source: Ambulatory Visit | Attending: Urology | Admitting: Urology

## 2023-04-25 ENCOUNTER — Encounter (HOSPITAL_BASED_OUTPATIENT_CLINIC_OR_DEPARTMENT_OTHER): Payer: Self-pay

## 2023-04-25 DIAGNOSIS — R3129 Other microscopic hematuria: Secondary | ICD-10-CM | POA: Insufficient documentation

## 2023-04-25 DIAGNOSIS — K760 Fatty (change of) liver, not elsewhere classified: Secondary | ICD-10-CM | POA: Diagnosis not present

## 2023-04-25 DIAGNOSIS — N3289 Other specified disorders of bladder: Secondary | ICD-10-CM | POA: Diagnosis not present

## 2023-04-25 DIAGNOSIS — R319 Hematuria, unspecified: Secondary | ICD-10-CM | POA: Diagnosis not present

## 2023-04-25 DIAGNOSIS — K5730 Diverticulosis of large intestine without perforation or abscess without bleeding: Secondary | ICD-10-CM | POA: Diagnosis not present

## 2023-04-25 MED ORDER — IOHEXOL 300 MG/ML  SOLN
125.0000 mL | Freq: Once | INTRAMUSCULAR | Status: AC | PRN
  Administered 2023-04-25: 125 mL via INTRAVENOUS

## 2023-05-05 ENCOUNTER — Other Ambulatory Visit: Payer: Self-pay | Admitting: Urology

## 2023-05-05 DIAGNOSIS — N321 Vesicointestinal fistula: Secondary | ICD-10-CM

## 2023-05-07 ENCOUNTER — Ambulatory Visit: Payer: Commercial Managed Care - PPO | Admitting: Urology

## 2023-05-07 ENCOUNTER — Telehealth: Payer: Self-pay

## 2023-05-07 ENCOUNTER — Ambulatory Visit (HOSPITAL_BASED_OUTPATIENT_CLINIC_OR_DEPARTMENT_OTHER): Payer: Commercial Managed Care - PPO

## 2023-05-07 ENCOUNTER — Encounter: Payer: Self-pay | Admitting: Urology

## 2023-05-07 ENCOUNTER — Encounter (HOSPITAL_BASED_OUTPATIENT_CLINIC_OR_DEPARTMENT_OTHER): Payer: Self-pay

## 2023-05-07 VITALS — BP 130/72 | HR 97

## 2023-05-07 DIAGNOSIS — R972 Elevated prostate specific antigen [PSA]: Secondary | ICD-10-CM

## 2023-05-07 DIAGNOSIS — N321 Vesicointestinal fistula: Secondary | ICD-10-CM | POA: Diagnosis not present

## 2023-05-07 DIAGNOSIS — N401 Enlarged prostate with lower urinary tract symptoms: Secondary | ICD-10-CM | POA: Diagnosis not present

## 2023-05-07 DIAGNOSIS — R3129 Other microscopic hematuria: Secondary | ICD-10-CM | POA: Diagnosis not present

## 2023-05-07 LAB — URINALYSIS, ROUTINE W REFLEX MICROSCOPIC
Bilirubin, UA: NEGATIVE
Glucose, UA: NEGATIVE
Ketones, UA: NEGATIVE
Leukocytes,UA: NEGATIVE
Nitrite, UA: NEGATIVE
Specific Gravity, UA: 1.015 (ref 1.005–1.030)
Urobilinogen, Ur: 0.2 mg/dL (ref 0.2–1.0)
pH, UA: 6.5 (ref 5.0–7.5)

## 2023-05-07 LAB — MICROSCOPIC EXAMINATION

## 2023-05-07 MED ORDER — SULFAMETHOXAZOLE-TRIMETHOPRIM 800-160 MG PO TABS
1.0000 | ORAL_TABLET | Freq: Once | ORAL | Status: AC
  Administered 2023-05-07: 1 via ORAL

## 2023-05-07 NOTE — Telephone Encounter (Signed)
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  Patient states he is scheduled to see [facility] in consult on 05-26-23.  Patient agreed to plan and verbalized understanding.

## 2023-05-07 NOTE — Telephone Encounter (Signed)
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  Strangely enough, he had a recent normal colonoscopy, so not sure how this develops so rapidly.  Thanks.

## 2023-05-07 NOTE — Progress Notes (Signed)
Assessment: 1. Colovesical fistula   2. Other microscopic hematuria   3. Benign prostatic hyperplasia with lower urinary tract symptoms, symptom details unspecified   4. Elevated PSA     Plan: Today I had a long discussion with the patient and his wife regarding his diverticulitis with colovesical fistula.  I have placed referral to general surgery and will also let his GI physician know as well.  I told him at this time, we will put his prostate evaluation on hold until after his colonic issues have been dealt with which is more of an acute issue. He will contact us to reschedule prostate biopsy.  Chief Complaint:   HPI: Perry Zamora is a 63 y.o. male who presents for continued evaluation of elevated PSA and microscopic hematuria. CT hematuria protocol reviewed today with patient and wife-see full report below.  This shows significant colonic inflammation/diverticulitis with a sigmoid colovesical fistula. Patient does have significant lower urinary tract symptoms but also has a large prostate as well as elevated PSA.  He denies any pneumaturia or fecaluria.  Interestingly, patient underwent routine surveillance colonoscopy on 04/01/2023 without significant findings (Dr. Barron Alvine-- [facility]).  CT Heme protocol 04/2023 IMPRESSION: 1. Fistulous connection between the mid sigmoid colon and the adjacent superior left aspect of the urinary bladder, with bladder wall thickening and a small intramural air and fluid collection in the bladder wall measuring 1.2 x 0.8 cm. Findings consistent with colovesical fistula and intramural bladder abscess, unclear if this collection further communicates to the bladder lumen itself. 2. Colonic diverticulosis with long segment wall thickening of the sigmoid colon, consistent with acute and/or chronic diverticulitis. 3. Additionally, there is diffuse, mild wall thickening mucosal hyperenhancement throughout the remainder of the colon,  particularly evident in the distal rectosigmoid colon. Colon is fluid-filled to the rectum. Findings are consistent with nonspecific infectious or inflammatory colitis, perhaps including inflammatory bowel disease. 4. Colon is fluid-filled to the rectum, consistent with diarrheal illness. 5. No urinary tract calculi, mass, or hydronephrosis. 6. Hepatic steatosis. 7. Prostatomegaly.  Cystoscopy 04/2023--large prostate (no median lobe).  Bladder normal except for inflammatory change high up on posterior wall just to left of midline.    Portions of the above documentation were copied from a prior visit for review purposes only.  Allergies: No Known Allergies  PMH: Past Medical History:  Diagnosis Date   HTN (hypertension)     PSH: No past surgical history on file.  SH: Social History   Tobacco Use   Smoking status: Never   Smokeless tobacco: Never  Vaping Use   Vaping status: Never Used  Substance Use Topics   Alcohol use: Not Currently    Comment: heavy until Quit 6 months ago   Drug use: Never    ROS: Constitutional:  Negative for fever, chills, weight loss CV: Negative for chest pain, previous MI, hypertension Respiratory:  Negative for shortness of breath, wheezing, sleep apnea, frequent cough GI:  Negative for nausea, vomiting, bloody stool, GERD  PE: BP 130/72   Pulse 97  GENERAL APPEARANCE:  Well appearing, well developed, well nourished, NAD    Results: UA is remarkable for's significant microscopic hematuria (11-30 RBC) no significant pyuria or evidence of infection  Procedure:  Cystourethroscopy  Indication:  microhematuria; colovesical fistula  Description of procedure:   The patient was brought to the procedure room where he was correctly identified and the procedure again reviewed with him.  Informed consent was obtained.  The patient was then positioned  supine on the cystoscopy table and his external genitalia were prepped and draped in usual  fashion.  Preprocedural timeout was performed.  Flexible cystoscopy was subsequently performed.  This revealed a normal anterior urethra.  The prostatic urethra revealed marked lateral lobe enlargement and a long prostate without significant intravesical extension.  Bladder was subsequently entered and carefully inspected.  The ureteral openings appeared normal.  There was mild bladder trabeculation.  There was noted also to be some inflammatory changes high up on the posterior bladder wall just to the left of the midline corresponding to the site on CT scan of the colovesical fistula.  At this time the scope was removed and the procedure terminated.  Well-tolerated.  No complications.

## 2023-05-14 ENCOUNTER — Ambulatory Visit: Payer: Commercial Managed Care - PPO | Admitting: Urology

## 2023-05-26 ENCOUNTER — Other Ambulatory Visit (HOSPITAL_COMMUNITY): Payer: Self-pay

## 2023-05-26 ENCOUNTER — Ambulatory Visit: Payer: Self-pay | Admitting: Surgery

## 2023-05-26 ENCOUNTER — Other Ambulatory Visit: Payer: Self-pay

## 2023-05-26 ENCOUNTER — Encounter: Payer: Self-pay | Admitting: Surgery

## 2023-05-26 DIAGNOSIS — R972 Elevated prostate specific antigen [PSA]: Secondary | ICD-10-CM | POA: Diagnosis not present

## 2023-05-26 DIAGNOSIS — N321 Vesicointestinal fistula: Secondary | ICD-10-CM | POA: Diagnosis present

## 2023-05-26 DIAGNOSIS — R319 Hematuria, unspecified: Secondary | ICD-10-CM | POA: Diagnosis not present

## 2023-05-26 DIAGNOSIS — Z8719 Personal history of other diseases of the digestive system: Secondary | ICD-10-CM

## 2023-05-26 DIAGNOSIS — N390 Urinary tract infection, site not specified: Secondary | ICD-10-CM | POA: Diagnosis present

## 2023-05-26 DIAGNOSIS — R739 Hyperglycemia, unspecified: Secondary | ICD-10-CM

## 2023-05-26 MED ORDER — METRONIDAZOLE 500 MG PO TABS
ORAL_TABLET | ORAL | 0 refills | Status: DC
  Filled 2023-05-26: qty 6, 6d supply, fill #0

## 2023-05-26 MED ORDER — POLYETHYLENE GLYCOL 3350 17 GM/SCOOP PO POWD
238.0000 g | Freq: Once | ORAL | 0 refills | Status: AC
Start: 2023-05-26 — End: 2023-06-03
  Filled 2023-05-26: qty 238, 1d supply, fill #0

## 2023-05-26 MED ORDER — BISACODYL 5 MG PO TBEC
5.0000 mg | DELAYED_RELEASE_TABLET | Freq: Every day | ORAL | 0 refills | Status: DC
  Filled 2023-05-26: qty 4, 1d supply, fill #0

## 2023-05-26 MED ORDER — NEOMYCIN SULFATE 500 MG PO TABS
500.0000 mg | ORAL_TABLET | Freq: Every day | ORAL | 0 refills | Status: DC
Start: 2023-05-26 — End: 2023-06-25
  Filled 2023-05-26: qty 6, 6d supply, fill #0

## 2023-05-26 MED ORDER — ONDANSETRON 4 MG PO TBDP
4.0000 mg | ORAL_TABLET | Freq: Three times a day (TID) | ORAL | 0 refills | Status: DC | PRN
  Filled 2023-05-26 (×2): qty 2, 1d supply, fill #0

## 2023-05-30 ENCOUNTER — Other Ambulatory Visit (HOSPITAL_COMMUNITY): Payer: Self-pay

## 2023-06-02 ENCOUNTER — Other Ambulatory Visit (HOSPITAL_COMMUNITY): Payer: Self-pay

## 2023-06-02 ENCOUNTER — Other Ambulatory Visit: Payer: Self-pay | Admitting: Urology

## 2023-06-13 NOTE — Patient Instructions (Signed)
 SURGICAL WAITING ROOM VISITATION  Patients having surgery or a procedure may have no more than 2 support people in the waiting area - these visitors may rotate.    Children under the age of 81 must have an adult with them who is not the patient.  Due to an increase in RSV and influenza rates and associated hospitalizations, children ages 94 and under may not visit patients in [facility] hospitals.  Visitors with respiratory illnesses are discouraged from visiting and should remain at home.  If the patient needs to stay at the hospital during part of their recovery, the visitor guidelines for inpatient rooms apply. Pre-op nurse will coordinate an appropriate time for 1 support person to accompany patient in pre-op.  This support person may not rotate.    Please refer to the [facility] website for the visitor guidelines for Inpatients (after your surgery is over and you are in a regular room).       Your procedure is scheduled on: 06/25/23   Report to [facility] Main Entrance    Report to admitting at 9:15 AM   Call this number if you have problems the morning of surgery [phone]   Do not eat food :After Midnight.   After Midnight you may have the following liquids until 8:30 AM DAY OF SURGERY  Water Non-Citrus Juices (without pulp, NO RED-Apple, White grape, White cranberry) Black Coffee (NO MILK/CREAM OR CREAMERS, sugar ok)  Clear Tea (NO MILK/CREAM OR CREAMERS, sugar ok) regular and decaf                             Plain Jell-O (NO RED)                                           Fruit ices (not with fruit pulp, NO RED)                                     Popsicles (NO RED)                                                               Sports drinks like Gatorade (NO RED)              Drink 2 Ensure/G2 drinks AT 10:00 PM the night before surgery.        The day of surgery:  Drink ONE (1) Pre-Surgery Clear Ensure at  8:30 AM the morning of surgery. Drink in one  sitting. Do not sip.  This drink was given to you during your hospital  pre-op appointment visit. Nothing else to drink after completing the  Pre-Surgery Clear Ensure     FOLLOW BOWEL PREP AND ANY ADDITIONAL PRE OP INSTRUCTIONS YOU RECEIVED FROM YOUR SURGEON'S OFFICE!!!     Oral Hygiene is also important to reduce your risk of infection.  Remember - BRUSH YOUR TEETH THE MORNING OF SURGERY WITH YOUR REGULAR TOOTHPASTE   Stop all vitamins and herbal supplements 7 days before surgery.   Take these medicines the morning of surgery with A SIP OF WATER: Lexapro, Tamsulosin             You may not have any metal on your body including hair pins, jewelry, and body piercing             Do not wear make-up, lotions, powders, perfumes/cologne, or deodorant              Men may shave face and neck.   Do not bring valuables to the hospital. [facility] IS NOT             RESPONSIBLE   FOR VALUABLES.   Contacts, glasses, dentures or bridgework may not be worn into surgery.   Bring small overnight bag day of surgery.   DO NOT BRING YOUR HOME MEDICATIONS TO THE HOSPITAL. PHARMACY WILL DISPENSE MEDICATIONS LISTED ON YOUR MEDICATION LIST TO YOU DURING YOUR ADMISSION IN THE HOSPITAL!    Patients discharged on the day of surgery will not be allowed to drive home.  Someone NEEDS to stay with you for the first 24 hours after anesthesia.   Special Instructions: Bring a copy of your healthcare power of attorney and living will documents the day of surgery if you haven't scanned them before.              Please read over the following fact sheets you were given: IF YOU HAVE QUESTIONS ABOUT YOUR PRE-OP INSTRUCTIONS PLEASE CALL [phone] Rosey [facility]   If you received a COVID test during your pre-op visit  it is requested that you wear a mask when out in public, stay away from anyone that may not be feeling well and notify your surgeon if you develop symptoms. If you test  positive for Covid or have been in contact with anyone that has tested positive in the last 10 days please notify you surgeon.    [place] - Preparing for Surgery Before surgery, you can play an important role.  Because skin is not sterile, your skin needs to be as free of germs as possible.  You can reduce the number of germs on your skin by washing with CHG (chlorahexidine gluconate) soap before surgery.  CHG is an antiseptic cleaner which kills germs and bonds with the skin to continue killing germs even after washing. Please DO NOT use if you have an allergy to CHG or antibacterial soaps.  If your skin becomes reddened/irritated stop using the CHG and inform your nurse when you arrive at Short Stay. Do not shave (including legs and underarms) for at least 48 hours prior to the first CHG shower.  You may shave your face/neck.  Please follow these instructions carefully:  1.  Shower with CHG Soap the night before surgery and the  morning of surgery.  2.  If you choose to wash your hair, wash your hair first as usual with your normal  shampoo.  3.  After you shampoo, rinse your hair and body thoroughly to remove the shampoo.                             4.  Use CHG as you would any other liquid soap.  You can apply chg directly to the skin and wash.  Gently with a scrungie  or clean washcloth.  5.  Apply the CHG Soap to your body ONLY FROM THE NECK DOWN.   Do   not use on face/ open                           Wound or open sores. Avoid contact with eyes, ears mouth and   genitals (private parts).                       Wash face,  Genitals (private parts) with your normal soap.             6.  Wash thoroughly, paying special attention to the area where your    surgery  will be performed.  7.  Thoroughly rinse your body with warm water from the neck down.  8.  DO NOT shower/wash with your normal soap after using and rinsing off the CHG Soap.                9.  Pat yourself dry with a clean towel.             10.  Wear clean pajamas.            11.  Place clean sheets on your bed the night of your first shower and do not  sleep with pets. Day of Surgery : Do not apply any lotions/deodorants the morning of surgery.  Please wear clean clothes to the hospital/surgery center.  FAILURE TO FOLLOW THESE INSTRUCTIONS MAY RESULT IN THE CANCELLATION OF YOUR SURGERY  PATIENT SIGNATURE_________________________________  NURSE SIGNATURE__________________________________   Incentive Spirometer  An incentive spirometer is a tool that can help keep your lungs clear and active. This tool measures how well you are filling your lungs with each breath. Taking long deep breaths may help reverse or decrease the chance of developing breathing (pulmonary) problems (especially infection) following: A long period of time when you are unable to move or be active. BEFORE THE PROCEDURE  If the spirometer includes an indicator to show your best effort, your nurse or respiratory therapist will set it to a desired goal. If possible, sit up straight or lean slightly forward. Try not to slouch. Hold the incentive spirometer in an upright position. INSTRUCTIONS FOR USE  Sit on the edge of your bed if possible, or sit up as far as you can in bed or on a chair. Hold the incentive spirometer in an upright position. Breathe out normally. Place the mouthpiece in your mouth and seal your lips tightly around it. Breathe in slowly and as deeply as possible, raising the piston or the ball toward the top of the column. Hold your breath for 3-5 seconds or for as long as possible. Allow the piston or ball to fall to the bottom of the column. Remove the mouthpiece from your mouth and breathe out normally. Rest for a few seconds and repeat Steps 1 through 7 at least 10 times every 1-2 hours when you are awake. Take your time and take a few normal breaths between deep breaths. The spirometer may include an indicator to show your best  effort. Use the indicator as a goal to work toward during each repetition. After each set of 10 deep breaths, practice coughing to be sure your lungs are clear. If you have an incision (the cut made at the time of surgery), support your incision when coughing by placing a pillow or rolled up  towels firmly against it. Once you are able to get out of bed, walk around indoors and cough well. You may stop using the incentive spirometer when instructed by your caregiver.  RISKS AND COMPLICATIONS Take your time so you do not get dizzy or light-headed. If you are in pain, you may need to take or ask for pain medication before doing incentive spirometry. It is harder to take a deep breath if you are having pain. AFTER USE Rest and breathe slowly and easily. It can be helpful to keep track of a log of your progress. Your caregiver can provide you with a simple table to help with this. If you are using the spirometer at home, follow these instructions: SEEK MEDICAL CARE IF:  You are having difficultly using the spirometer. You have trouble using the spirometer as often as instructed. Your pain medication is not giving enough relief while using the spirometer. You develop fever of 100.5 F (38.1 C) or higher. SEEK IMMEDIATE MEDICAL CARE IF:  You cough up bloody sputum that had not been present before. You develop fever of 102 F (38.9 C) or greater. You develop worsening pain at or near the incision site. MAKE SURE YOU:  Understand these instructions. Will watch your condition. Will get help right away if you are not doing well or get worse.   WHAT IS A BLOOD TRANSFUSION? Blood Transfusion Information  A transfusion is the replacement of blood or some of its parts. Blood is made up of multiple cells which provide different functions. Red blood cells carry oxygen and are used for blood loss replacement. White blood cells fight against infection. Platelets control bleeding. Plasma helps clot  blood. Other blood products are available for specialized needs, such as hemophilia or other clotting disorders. BEFORE THE TRANSFUSION  Who gives blood for transfusions?  Healthy volunteers who are fully evaluated to make sure their blood is safe. This is blood bank blood. Transfusion therapy is the safest it has ever been in the practice of medicine. Before blood is taken from a donor, a complete history is taken to make sure that person has no history of diseases nor engages in risky social behavior (examples are intravenous drug use or sexual activity with multiple partners). The donor's travel history is screened to minimize risk of transmitting infections, such as malaria. The donated blood is tested for signs of infectious diseases, such as HIV and hepatitis. The blood is then tested to be sure it is compatible with you in order to minimize the chance of a transfusion reaction. If you or a relative donates blood, this is often done in anticipation of surgery and is not appropriate for emergency situations. It takes many days to process the donated blood. RISKS AND COMPLICATIONS Although transfusion therapy is very safe and saves many lives, the main dangers of transfusion include:  Getting an infectious disease. Developing a transfusion reaction. This is an allergic reaction to something in the blood you were given. Every precaution is taken to prevent this. The decision to have a blood transfusion has been considered carefully by your caregiver before blood is given. Blood is not given unless the benefits outweigh the risks. AFTER THE TRANSFUSION Right after receiving a blood transfusion, you will usually feel much better and more energetic. This is especially true if your red blood cells have gotten low (anemic). The transfusion raises the level of the red blood cells which carry oxygen, and this usually causes an energy increase. The nurse administering the  transfusion will monitor you  carefully for complications. HOME CARE INSTRUCTIONS  No special instructions are needed after a transfusion. You may find your energy is better. Speak with your caregiver about any limitations on activity for underlying diseases you may have. SEEK MEDICAL CARE IF:  Your condition is not improving after your transfusion. You develop redness or irritation at the intravenous (IV) site. SEEK IMMEDIATE MEDICAL CARE IF:  Any of the following symptoms occur over the next 12 hours: Shaking chills. You have a temperature by mouth above 102 F (38.9 C), not controlled by medicine. Chest, back, or muscle pain. People around you feel you are not acting correctly or are confused. Shortness of breath or difficulty breathing. Dizziness and fainting. You get a rash or develop hives. You have a decrease in urine output. Your urine turns a dark color or changes to pink, red, or brown. Any of the following symptoms occur over the next 10 days: You have a temperature by mouth above 102 F (38.9 C), not controlled by medicine. Shortness of breath. Weakness after normal activity. The white part of the eye turns yellow (jaundice). You have a decrease in the amount of urine or are urinating less often. Your urine turns a dark color or changes to pink, red, or brown. Document Released: 03/29/2000 Document Revised: 06/24/2011 Document Reviewed: 11/16/2007 [facility] Patient Information 2014 [place], [place].

## 2023-06-13 NOTE — Progress Notes (Addendum)
 COVID Vaccine received:  []  No [x]  Yes Date of any COVID positive Test in last 90 days: no PCP - Dr. Edwina Barth Cardiologist - n/a  Chest x-ray -  EKG -   Stress Test -  ECHO - 03/25/20 Epic Cardiac Cath -   Bowel Prep - [x]  No  []   Yes ______  Pacemaker / ICD device [x]  No []  Yes   Spinal Cord Stimulator:[x]  No []  Yes       History of Sleep Apnea? [x]  No []  Yes   CPAP used?- [x]  No []  Yes    Does the patient monitor blood sugar?          [x]  No []  Yes  []  N/A  Patient has: [x]  NO Hx DM   []  Pre-DM                 []  DM1  []   DM2 Does patient have a [employer] or Dexacom? []  No []  Yes   Fasting Blood Sugar Ranges-  Checks Blood Sugar _____ times a day  GLP1 agonist / usual dose - no GLP1 instructions:  SGLT-2 inhibitors / usual dose - no SGLT-2 instructions:   Blood Thinner / Instructions:no Aspirin Instructions:no  Comments:   Activity level: Patient is able  to climb a flight of stairs without difficulty; [x]  No CP  [x]  No SOB,   Patient can  perform ADLs without assistance.   Anesthesia review:   Patient denies shortness of breath, fever, cough and chest pain at PAT appointment.  Patient verbalized understanding and agreement to the Pre-Surgical Instructions that were given to them at this PAT appointment. Patient was also educated of the need to review these PAT instructions again prior to his/her surgery.I reviewed the appropriate phone numbers to call if they have any and questions or concerns.

## 2023-06-16 ENCOUNTER — Other Ambulatory Visit: Payer: Self-pay

## 2023-06-16 ENCOUNTER — Encounter (HOSPITAL_COMMUNITY): Payer: Self-pay

## 2023-06-16 ENCOUNTER — Encounter (HOSPITAL_COMMUNITY)
Admission: RE | Admit: 2023-06-16 | Discharge: 2023-06-16 | Disposition: A | Discharge status: Home / Self Care | Payer: Commercial Managed Care - PPO | Source: Ambulatory Visit | Attending: Surgery

## 2023-06-16 VITALS — BP 112/79 | HR 67 | Temp 98.3°F | Resp 16 | Ht 63.0 in | Wt 164.0 lb

## 2023-06-16 DIAGNOSIS — Z01818 Encounter for other preprocedural examination: Secondary | ICD-10-CM | POA: Insufficient documentation

## 2023-06-16 DIAGNOSIS — I10 Essential (primary) hypertension: Secondary | ICD-10-CM | POA: Insufficient documentation

## 2023-06-16 DIAGNOSIS — R739 Hyperglycemia, unspecified: Secondary | ICD-10-CM | POA: Insufficient documentation

## 2023-06-16 LAB — BASIC METABOLIC PANEL
Anion gap: 12 (ref 5–15)
BUN: 16 mg/dL (ref 6–20)
CO2: 24 mmol/L (ref 22–32)
Calcium: 9.2 mg/dL (ref 8.9–10.3)
Chloride: 95 mmol/L — ABNORMAL LOW (ref 98–111)
Creatinine, Ser: 0.82 mg/dL (ref 0.61–1.24)
GFR, Estimated: >60 mL/min (ref >60)
Glucose, Bld: 104 mg/dL — ABNORMAL HIGH (ref 70–99)
Potassium: 3.5 mmol/L (ref 3.5–5.1)
Sodium: 131 mmol/L — ABNORMAL LOW (ref 135–145)

## 2023-06-16 LAB — CBC
HCT: 37.1 % — ABNORMAL LOW (ref 39.0–52.0)
Hemoglobin: 12.3 g/dL — ABNORMAL LOW (ref 13.0–17.0)
MCH: 31.4 pg (ref 26.0–34.0)
MCHC: 33.2 g/dL (ref 30.0–36.0)
MCV: 94.6 fL (ref 80.0–100.0)
Platelets: 377 10*3/uL (ref 150–400)
RBC: 3.92 MIL/uL — ABNORMAL LOW (ref 4.22–5.81)
RDW: 13.7 % (ref 11.5–15.5)
WBC: 8.7 10*3/uL (ref 4.0–10.5)
nRBC: 0 % (ref 0.0–0.2)

## 2023-06-16 LAB — HEMOGLOBIN A1C
Hgb A1c MFr Bld: 5.6 % (ref 4.8–5.6)
Mean Plasma Glucose: 114.02 mg/dL

## 2023-06-16 NOTE — Consult Note (Addendum)
 WOC Nurse requested for preoperative stoma site marking  Discussed surgical procedure and stoma creation with patient.  Explained role of the WOC nurse team.  Provided the patient with educational booklet and provided samples of pouching options.  Answered patient's questions.   Examined patient sitting, and standing in order to place the marking in the patient's visual field, away from any creases or abdominal contour issues and within the rectus muscle. Attempted to mark below the patient's belt line, but this was not possible, since a crease occurs lower on the abd when he leans forward which should be avoided if possible.   Marked for colostomy in the LLQ  _5___ cm to the left of the umbilicus and __2__cm below the umbilicus.  Marked for ileostomy in the RLQ  __5__cm to the right of the umbilicus and  __2__ cm below the umbilicus.  Patient's abdomen cleansed with CHG wipes at site markings, allowed to air dry prior to marking.Covered mark with thin film transparent dressing to preserve mark until date of surgery. Provided with marking pen and instructed to re-color in the mark if it begins to fade prior to surgery.   WOC Nurse team will follow up with patient after surgery for continued ostomy care and teaching if he receives an ostomy.  Thank-you,  Cammie Mcgee MSN, RN, CWOCN, [place], CNS [phone]

## 2023-06-24 NOTE — H&P (Signed)
 H&P  Chief Complaint: Colovesical fistula   History of Present Illness: 63 year old male with a history of colovesical fistula plan to have robotic resection of colon and rectosigmoid possible bladder.  Today with Dr. Jeannett Senior gross.  General surgery has requested preoperative bilateral ureteral firefly injection to be completed.  Past Medical History:  Diagnosis Date   HTN (hypertension)    No past surgical history on file.  Home Medications:  No medications prior to admission.   Allergies: No Known Allergies  Family History  Problem Relation Age of Onset   Colon cancer Neg Hx    Colon polyps Neg Hx    Esophageal cancer Neg Hx    Rectal cancer Neg Hx    Stomach cancer Neg Hx    Social History:  reports that he has never smoked. He has never used smokeless tobacco. He reports current alcohol use. He reports that he does not use drugs.  ROS: A complete review of systems was performed.  All systems are negative except for pertinent findings as noted. ROS   Physical Exam:  Vital signs in last 24 hours:   General:  Alert and oriented, No acute distress HEENT: Normocephalic, atraumatic Neck: No JVD or lymphadenopathy Cardiovascular: Regular rate and rhythm Lungs: Regular rate and effort Abdomen: Soft, nontender, nondistended, no abdominal masses Back: No CVA tenderness Extremities: No edema Neurologic: Grossly intact  Laboratory Data:  No results found for this or any previous visit (from the past 24 hours). No results found for this or any previous visit (from the past 240 hours). Creatinine: No results for input(s): "CREATININE" in the last 168 hours.  Impression/Assessment:  63 year old male with a history of colovesical fistula plan to have robotic resection of colon and rectosigmoid possible bladder.  Today with Dr. Jeannett Senior gross.  General surgery has requested preoperative bilateral ureteral firefly injection to be completed.  Plan:  To OR for bilateral firefly  injection  Discussed risks benefits and alternatives to the procedure inlcuding infection, bleeding and damage to urethra, bladder and ureters.  Patient voiced his understanding and consent was obtained.   Adonis Brook 06/24/2023, 8:53 AM

## 2023-06-24 NOTE — Anesthesia Preprocedure Evaluation (Signed)
 Anesthesia Evaluation  Patient identified by MRN, date of birth, ID band Patient awake    Reviewed: Allergy & Precautions, NPO status , Patient's Chart, lab work & pertinent test results  History of Anesthesia Complications Negative for: history of anesthetic complications  Airway Mallampati: II  TM Distance: >3 FB Neck ROM: Full    Dental no notable dental hx. (+) Teeth Intact, Dental Advisory Given   Pulmonary neg pulmonary ROS   Pulmonary exam normal breath sounds clear to auscultation       Cardiovascular hypertension, Pt. on medications (-) angina (-) Past MI Normal cardiovascular exam Rhythm:Regular Rate:Normal     Neuro/Psych    GI/Hepatic   Endo/Other  negative endocrine ROS    Renal/GU Lab Results      Component                Value               Date                    K                        3.5                 06/16/2023                CO2                      24                  06/16/2023                BUN                      16                  06/16/2023                CREATININE               0.82                06/16/2023                     Musculoskeletal negative musculoskeletal ROS (+)    Abdominal   Peds  Hematology Lab Results      Component                Value               Date                      WBC                      8.7                 06/16/2023                HGB                      12.3 (L)            06/16/2023                HCT  37.1 (L)            06/16/2023                MCV                      94.6                06/16/2023                PLT                      377                 06/16/2023              Anesthesia Other Findings   Reproductive/Obstetrics                             Anesthesia Physical Anesthesia Plan  ASA: 3  Anesthesia Plan: General   Post-op Pain Management: Regional block*, Minimal or no  pain anticipated and Ketamine IV*   Induction: Intravenous  PONV Risk Score and Plan: 3 and Treatment may vary due to age or medical condition, Midazolam, Ondansetron and Dexamethasone  Airway Management Planned: Oral ETT  Additional Equipment: None  Intra-op Plan:   Post-operative Plan: Extubation in OR  Informed Consent: I have reviewed the patients History and Physical, chart, labs and discussed the procedure including the risks, benefits and alternatives for the proposed anesthesia with the patient or authorized representative who has indicated his/her understanding and acceptance.     Dental advisory given  Plan Discussed with: CRNA and Surgeon  Anesthesia Plan Comments:        Anesthesia Quick Evaluation

## 2023-06-25 ENCOUNTER — Other Ambulatory Visit (HOSPITAL_COMMUNITY): Payer: Self-pay

## 2023-06-25 ENCOUNTER — Other Ambulatory Visit: Payer: Self-pay

## 2023-06-25 ENCOUNTER — Encounter (HOSPITAL_COMMUNITY): Payer: Self-pay | Admitting: Surgery

## 2023-06-25 ENCOUNTER — Inpatient Hospital Stay (HOSPITAL_COMMUNITY)
Admission: RE | Admit: 2023-06-25 | Discharge: 2023-06-27 | DRG: 982 | Disposition: A | Discharge status: Home / Self Care | Payer: Commercial Managed Care - PPO | Source: Ambulatory Visit | Attending: Surgery | Admitting: Surgery

## 2023-06-25 ENCOUNTER — Inpatient Hospital Stay (HOSPITAL_COMMUNITY): Payer: Self-pay | Admitting: Anesthesiology

## 2023-06-25 ENCOUNTER — Inpatient Hospital Stay (HOSPITAL_COMMUNITY)

## 2023-06-25 ENCOUNTER — Encounter (HOSPITAL_COMMUNITY)
Admission: RE | Disposition: A | Discharge status: Home / Self Care | Payer: Self-pay | Source: Ambulatory Visit | Attending: Surgery

## 2023-06-25 DIAGNOSIS — R319 Hematuria, unspecified: Secondary | ICD-10-CM | POA: Diagnosis present

## 2023-06-25 DIAGNOSIS — Z87440 Personal history of urinary (tract) infections: Secondary | ICD-10-CM | POA: Diagnosis not present

## 2023-06-25 DIAGNOSIS — K5720 Diverticulitis of large intestine with perforation and abscess without bleeding: Secondary | ICD-10-CM | POA: Diagnosis not present

## 2023-06-25 DIAGNOSIS — N321 Vesicointestinal fistula: Secondary | ICD-10-CM | POA: Diagnosis not present

## 2023-06-25 DIAGNOSIS — Z79899 Other long term (current) drug therapy: Secondary | ICD-10-CM

## 2023-06-25 DIAGNOSIS — F419 Anxiety disorder, unspecified: Secondary | ICD-10-CM | POA: Insufficient documentation

## 2023-06-25 DIAGNOSIS — R972 Elevated prostate specific antigen [PSA]: Secondary | ICD-10-CM | POA: Diagnosis not present

## 2023-06-25 DIAGNOSIS — I10 Essential (primary) hypertension: Secondary | ICD-10-CM

## 2023-06-25 DIAGNOSIS — K5732 Diverticulitis of large intestine without perforation or abscess without bleeding: Secondary | ICD-10-CM | POA: Diagnosis not present

## 2023-06-25 DIAGNOSIS — K760 Fatty (change of) liver, not elsewhere classified: Secondary | ICD-10-CM | POA: Diagnosis present

## 2023-06-25 DIAGNOSIS — N390 Urinary tract infection, site not specified: Secondary | ICD-10-CM | POA: Diagnosis not present

## 2023-06-25 DIAGNOSIS — Z8719 Personal history of other diseases of the digestive system: Secondary | ICD-10-CM

## 2023-06-25 HISTORY — PX: FLEXIBLE SIGMOIDOSCOPY: SHX5431

## 2023-06-25 LAB — TYPE AND SCREEN
ABO/RH(D): O POS
Antibody Screen: NEGATIVE

## 2023-06-25 SURGERY — COLECTOMY, SIGMOID, ROBOT-ASSISTED
Anesthesia: General

## 2023-06-25 MED ORDER — ENSURE PRE-SURGERY PO LIQD: 592.0000 mL | Freq: Once | ORAL | Status: DC

## 2023-06-25 MED ORDER — ALVIMOPAN 12 MG PO CAPS
12.0000 mg | ORAL_CAPSULE | ORAL | Status: AC
  Administered 2023-06-25: 12 mg via ORAL
  Filled 2023-06-25: qty 1

## 2023-06-25 MED ORDER — ROCURONIUM BROMIDE 10 MG/ML (PF) SYRINGE
PREFILLED_SYRINGE | INTRAVENOUS | Status: DC | PRN
Start: 2023-06-25 — End: 2023-06-25
  Administered 2023-06-25: 70 mg via INTRAVENOUS
  Administered 2023-06-25: 20 mg via INTRAVENOUS

## 2023-06-25 MED ORDER — SODIUM CHLORIDE 0.9 % IV SOLN: 250.0000 mL | INTRAVENOUS | Status: DC | PRN

## 2023-06-25 MED ORDER — GABAPENTIN 100 MG PO CAPS
200.0000 mg | ORAL_CAPSULE | Freq: Every day | ORAL | Status: DC
  Administered 2023-06-25 – 2023-06-26 (×2): 200 mg via ORAL
  Filled 2023-06-25 (×2): qty 2

## 2023-06-25 MED ORDER — ENSURE SURGERY PO LIQD
237.0000 mL | Freq: Two times a day (BID) | ORAL | Status: DC
  Administered 2023-06-26 – 2023-06-27 (×3): 237 mL via ORAL

## 2023-06-25 MED ORDER — METHOCARBAMOL 500 MG PO TABS
1000.0000 mg | ORAL_TABLET | Freq: Four times a day (QID) | ORAL | Status: DC | PRN
  Administered 2023-06-25 – 2023-06-27 (×4): 1000 mg via ORAL
  Filled 2023-06-25 (×4): qty 2

## 2023-06-25 MED ORDER — METHYLENE BLUE (ANTIDOTE) 1 % IV SOLN
INTRAVENOUS | Status: AC
  Filled 2023-06-25: qty 10

## 2023-06-25 MED ORDER — HYDROMORPHONE HCL 1 MG/ML IJ SOLN
INTRAMUSCULAR | Status: AC
  Filled 2023-06-25: qty 1

## 2023-06-25 MED ORDER — ENALAPRILAT 1.25 MG/ML IV SOLN: 0.6250 mg | Freq: Four times a day (QID) | INTRAVENOUS | Status: DC | PRN

## 2023-06-25 MED ORDER — BUPIVACAINE-EPINEPHRINE (PF) 0.25% -1:200000 IJ SOLN
INTRAMUSCULAR | Status: AC
  Filled 2023-06-25: qty 30

## 2023-06-25 MED ORDER — LACTATED RINGERS IV SOLN
INTRAVENOUS | Status: DC | PRN
Start: 2023-06-25 — End: 2023-06-25

## 2023-06-25 MED ORDER — METRONIDAZOLE 500 MG PO TABS: 1000.0000 mg | ORAL_TABLET | ORAL | Status: DC

## 2023-06-25 MED ORDER — CHLORHEXIDINE GLUCONATE 0.12 % MT SOLN
15.0000 mL | Freq: Once | OROMUCOSAL | Status: AC
  Administered 2023-06-25: 15 mL via OROMUCOSAL

## 2023-06-25 MED ORDER — STERILE WATER FOR INJECTION IJ SOLN
INTRAMUSCULAR | Status: AC
  Filled 2023-06-25: qty 10

## 2023-06-25 MED ORDER — PROPOFOL 10 MG/ML IV BOLUS
INTRAVENOUS | Status: DC | PRN
Start: 2023-06-25 — End: 2023-06-25
  Administered 2023-06-25: 180 mg via INTRAVENOUS

## 2023-06-25 MED ORDER — SODIUM CHLORIDE 0.9% FLUSH: 3.0000 mL | INTRAVENOUS | Status: DC | PRN

## 2023-06-25 MED ORDER — ALVIMOPAN 12 MG PO CAPS
12.0000 mg | ORAL_CAPSULE | Freq: Two times a day (BID) | ORAL | Status: DC
  Filled 2023-06-25: qty 1

## 2023-06-25 MED ORDER — FENTANYL CITRATE (PF) 100 MCG/2ML IJ SOLN
INTRAMUSCULAR | Status: AC
  Filled 2023-06-25: qty 2

## 2023-06-25 MED ORDER — ORAL CARE MOUTH RINSE: 15.0000 mL | Freq: Once | OROMUCOSAL | Status: AC

## 2023-06-25 MED ORDER — 0.9 % SODIUM CHLORIDE (POUR BTL) OPTIME
TOPICAL | Status: DC | PRN
  Administered 2023-06-25 (×2): 1000 mL

## 2023-06-25 MED ORDER — TRAMADOL HCL 50 MG PO TABS
50.0000 mg | ORAL_TABLET | Freq: Four times a day (QID) | ORAL | Status: DC | PRN
  Administered 2023-06-25 – 2023-06-27 (×4): 100 mg via ORAL
  Filled 2023-06-25 (×4): qty 2

## 2023-06-25 MED ORDER — LIDOCAINE HCL (CARDIAC) PF 100 MG/5ML IV SOSY
PREFILLED_SYRINGE | INTRAVENOUS | Status: DC | PRN
  Administered 2023-06-25: 100 mg via INTRATRACHEAL

## 2023-06-25 MED ORDER — MIDAZOLAM HCL 2 MG/2ML IJ SOLN
INTRAMUSCULAR | Status: DC | PRN
  Administered 2023-06-25: 2 mg via INTRAVENOUS

## 2023-06-25 MED ORDER — ENSURE PRE-SURGERY PO LIQD
296.0000 mL | Freq: Once | ORAL | Status: DC
Start: 2023-06-26 — End: 2023-06-25

## 2023-06-25 MED ORDER — KCL IN DEXTROSE-NACL 20-5-0.45 MEQ/L-%-% IV SOLN
INTRAVENOUS | Status: DC
  Filled 2023-06-25: qty 1000

## 2023-06-25 MED ORDER — SUGAMMADEX SODIUM 200 MG/2ML IV SOLN
INTRAVENOUS | Status: AC
  Filled 2023-06-25: qty 2

## 2023-06-25 MED ORDER — DIPHENHYDRAMINE HCL 12.5 MG/5ML PO ELIX: 12.5000 mg | ORAL_SOLUTION | Freq: Four times a day (QID) | ORAL | Status: DC | PRN

## 2023-06-25 MED ORDER — SIMETHICONE 80 MG PO CHEW: 40.0000 mg | CHEWABLE_TABLET | Freq: Four times a day (QID) | ORAL | Status: DC | PRN

## 2023-06-25 MED ORDER — ONDANSETRON HCL 4 MG/2ML IJ SOLN
INTRAMUSCULAR | Status: AC
  Filled 2023-06-25: qty 2

## 2023-06-25 MED ORDER — NEOMYCIN SULFATE 500 MG PO TABS: 1000.0000 mg | ORAL_TABLET | ORAL | Status: DC

## 2023-06-25 MED ORDER — SUGAMMADEX SODIUM 200 MG/2ML IV SOLN
INTRAVENOUS | Status: DC | PRN
  Administered 2023-06-25: 150 mg via INTRAVENOUS

## 2023-06-25 MED ORDER — ACETAMINOPHEN 10 MG/ML IV SOLN: 1000.0000 mg | Freq: Once | INTRAVENOUS | Status: DC | PRN

## 2023-06-25 MED ORDER — PHENYLEPHRINE HCL (PRESSORS) 10 MG/ML IV SOLN
INTRAVENOUS | Status: DC | PRN
  Administered 2023-06-25: 80 ug via INTRAVENOUS

## 2023-06-25 MED ORDER — BUPIVACAINE LIPOSOME 1.3 % IJ SUSP: 20.0000 mL | Freq: Once | INTRAMUSCULAR | Status: DC

## 2023-06-25 MED ORDER — SODIUM CHLORIDE 0.9 % IV SOLN
2.0000 g | Freq: Two times a day (BID) | INTRAVENOUS | Status: AC
  Administered 2023-06-26: 2 g via INTRAVENOUS
  Filled 2023-06-25: qty 2

## 2023-06-25 MED ORDER — KETAMINE HCL 50 MG/5ML IJ SOSY
PREFILLED_SYRINGE | INTRAMUSCULAR | Status: AC
  Filled 2023-06-25: qty 5

## 2023-06-25 MED ORDER — ENOXAPARIN SODIUM 40 MG/0.4ML IJ SOSY
40.0000 mg | PREFILLED_SYRINGE | INTRAMUSCULAR | Status: DC
  Administered 2023-06-26: 40 mg via SUBCUTANEOUS
  Filled 2023-06-25: qty 0.4

## 2023-06-25 MED ORDER — ACETAMINOPHEN 500 MG PO TABS
1000.0000 mg | ORAL_TABLET | ORAL | Status: AC
  Administered 2023-06-25: 1000 mg via ORAL
  Filled 2023-06-25: qty 2

## 2023-06-25 MED ORDER — FENTANYL CITRATE (PF) 100 MCG/2ML IJ SOLN
INTRAMUSCULAR | Status: DC | PRN
  Administered 2023-06-25: 100 ug via INTRAVENOUS
  Administered 2023-06-25: 50 ug via INTRAVENOUS

## 2023-06-25 MED ORDER — PROPOFOL 10 MG/ML IV BOLUS
INTRAVENOUS | Status: AC
  Filled 2023-06-25: qty 20

## 2023-06-25 MED ORDER — CELECOXIB 200 MG PO CAPS
200.0000 mg | ORAL_CAPSULE | ORAL | Status: AC
  Administered 2023-06-25: 200 mg via ORAL
  Filled 2023-06-25: qty 1

## 2023-06-25 MED ORDER — METHOCARBAMOL 500 MG PO TABS
500.0000 mg | ORAL_TABLET | Freq: Three times a day (TID) | ORAL | 1 refills | Status: DC | PRN
  Filled 2023-06-25: qty 20, 7d supply, fill #0

## 2023-06-25 MED ORDER — BUPIVACAINE LIPOSOME 1.3 % IJ SUSP
INTRAMUSCULAR | Status: DC | PRN
  Administered 2023-06-25: 20 mL

## 2023-06-25 MED ORDER — MENTHOL 3 MG MT LOZG: 1.0000 | LOZENGE | OROMUCOSAL | Status: DC | PRN

## 2023-06-25 MED ORDER — MAGIC MOUTHWASH: 15.0000 mL | Freq: Four times a day (QID) | ORAL | Status: DC | PRN

## 2023-06-25 MED ORDER — HYDROMORPHONE HCL 1 MG/ML IJ SOLN
0.2500 mg | INTRAMUSCULAR | Status: DC | PRN
  Administered 2023-06-25 (×2): 0.5 mg via INTRAVENOUS

## 2023-06-25 MED ORDER — IOHEXOL 300 MG/ML  SOLN
INTRAMUSCULAR | Status: DC | PRN
  Administered 2023-06-25: 15 mL

## 2023-06-25 MED ORDER — BUPIVACAINE LIPOSOME 1.3 % IJ SUSP
INTRAMUSCULAR | Status: AC
  Filled 2023-06-25: qty 20

## 2023-06-25 MED ORDER — HYDROMORPHONE HCL 1 MG/ML IJ SOLN: 0.5000 mg | INTRAMUSCULAR | Status: DC | PRN

## 2023-06-25 MED ORDER — GABAPENTIN 100 MG PO CAPS
200.0000 mg | ORAL_CAPSULE | ORAL | Status: AC
  Administered 2023-06-25: 200 mg via ORAL
  Filled 2023-06-25: qty 2

## 2023-06-25 MED ORDER — POLYETHYLENE GLYCOL 3350 17 GM/SCOOP PO POWD
1.0000 | Freq: Once | ORAL | Status: DC
Start: 2023-06-25 — End: 2023-06-25

## 2023-06-25 MED ORDER — DEXAMETHASONE SODIUM PHOSPHATE 10 MG/ML IJ SOLN
INTRAMUSCULAR | Status: DC | PRN
  Administered 2023-06-25: 5 mg via INTRAVENOUS

## 2023-06-25 MED ORDER — ESCITALOPRAM OXALATE 10 MG PO TABS
10.0000 mg | ORAL_TABLET | Freq: Every day | ORAL | Status: DC
  Administered 2023-06-25 – 2023-06-27 (×3): 10 mg via ORAL
  Filled 2023-06-25 (×3): qty 1

## 2023-06-25 MED ORDER — DIPHENHYDRAMINE HCL 50 MG/ML IJ SOLN: 12.5000 mg | Freq: Four times a day (QID) | INTRAMUSCULAR | Status: DC | PRN

## 2023-06-25 MED ORDER — DEXAMETHASONE SODIUM PHOSPHATE 10 MG/ML IJ SOLN
INTRAMUSCULAR | Status: AC
  Filled 2023-06-25: qty 1

## 2023-06-25 MED ORDER — PROCHLORPERAZINE MALEATE 10 MG PO TABS: 10.0000 mg | ORAL_TABLET | Freq: Four times a day (QID) | ORAL | Status: DC | PRN

## 2023-06-25 MED ORDER — ALUM & MAG HYDROXIDE-SIMETH 200-200-20 MG/5ML PO SUSP
30.0000 mL | Freq: Four times a day (QID) | ORAL | Status: DC | PRN
  Administered 2023-06-26: 30 mL via ORAL
  Filled 2023-06-25: qty 30

## 2023-06-25 MED ORDER — LACTATED RINGERS IV SOLN: INTRAVENOUS | Status: DC

## 2023-06-25 MED ORDER — LIDOCAINE HCL (PF) 2 % IJ SOLN
INTRAMUSCULAR | Status: AC
  Filled 2023-06-25: qty 5

## 2023-06-25 MED ORDER — MELATONIN 5 MG PO TABS
5.0000 mg | ORAL_TABLET | Freq: Every evening | ORAL | Status: DC | PRN
  Administered 2023-06-27: 5 mg via ORAL
  Filled 2023-06-25: qty 1

## 2023-06-25 MED ORDER — PROCHLORPERAZINE EDISYLATE 10 MG/2ML IJ SOLN: 5.0000 mg | Freq: Four times a day (QID) | INTRAMUSCULAR | Status: DC | PRN

## 2023-06-25 MED ORDER — OXYCODONE HCL 5 MG PO TABS: 5.0000 mg | ORAL_TABLET | Freq: Once | ORAL | Status: DC | PRN

## 2023-06-25 MED ORDER — SODIUM CHLORIDE 0.9 % IV SOLN
2.0000 g | INTRAVENOUS | Status: AC
  Administered 2023-06-25: 2 g via INTRAVENOUS
  Filled 2023-06-25: qty 2

## 2023-06-25 MED ORDER — ONDANSETRON HCL 4 MG PO TABS: 4.0000 mg | ORAL_TABLET | Freq: Four times a day (QID) | ORAL | Status: DC | PRN

## 2023-06-25 MED ORDER — ONDANSETRON HCL 4 MG/2ML IJ SOLN
INTRAMUSCULAR | Status: DC | PRN
  Administered 2023-06-25: 4 mg via INTRAVENOUS

## 2023-06-25 MED ORDER — TAMSULOSIN HCL 0.4 MG PO CAPS
0.4000 mg | ORAL_CAPSULE | Freq: Every day | ORAL | Status: DC
Start: 2023-06-25 — End: 2023-06-27
  Administered 2023-06-25 – 2023-06-27 (×3): 0.4 mg via ORAL
  Filled 2023-06-25 (×3): qty 1

## 2023-06-25 MED ORDER — ONDANSETRON HCL 4 MG/2ML IJ SOLN: 4.0000 mg | Freq: Four times a day (QID) | INTRAMUSCULAR | Status: DC | PRN

## 2023-06-25 MED ORDER — SODIUM CHLORIDE 0.9 % IR SOLN
Status: DC | PRN
  Administered 2023-06-25 (×2): 1000 mL

## 2023-06-25 MED ORDER — OXYCODONE HCL 5 MG/5ML PO SOLN: 5.0000 mg | Freq: Once | ORAL | Status: DC | PRN

## 2023-06-25 MED ORDER — SALINE SPRAY 0.65 % NA SOLN: 1.0000 | Freq: Four times a day (QID) | NASAL | Status: DC | PRN

## 2023-06-25 MED ORDER — ENOXAPARIN SODIUM 40 MG/0.4ML IJ SOSY
40.0000 mg | PREFILLED_SYRINGE | Freq: Once | INTRAMUSCULAR | Status: AC
  Administered 2023-06-25: 40 mg via SUBCUTANEOUS
  Filled 2023-06-25: qty 0.4

## 2023-06-25 MED ORDER — METHOCARBAMOL 1000 MG/10ML IJ SOLN: 1000.0000 mg | Freq: Four times a day (QID) | INTRAMUSCULAR | Status: DC | PRN

## 2023-06-25 MED ORDER — ACETAMINOPHEN 500 MG PO TABS
1000.0000 mg | ORAL_TABLET | Freq: Four times a day (QID) | ORAL | Status: DC
  Administered 2023-06-25 – 2023-06-27 (×5): 1000 mg via ORAL
  Filled 2023-06-25 (×7): qty 2

## 2023-06-25 MED ORDER — HYPROMELLOSE 2.5 % OP SOLN: 1.0000 [drp] | Freq: Four times a day (QID) | OPHTHALMIC | Status: DC | PRN

## 2023-06-25 MED ORDER — SODIUM CHLORIDE 0.9% FLUSH
3.0000 mL | Freq: Two times a day (BID) | INTRAVENOUS | Status: DC
  Administered 2023-06-25 – 2023-06-26 (×3): 3 mL via INTRAVENOUS

## 2023-06-25 MED ORDER — HYDRALAZINE HCL 20 MG/ML IJ SOLN: 10.0000 mg | INTRAMUSCULAR | Status: DC | PRN

## 2023-06-25 MED ORDER — AMISULPRIDE (ANTIEMETIC) 5 MG/2ML IV SOLN: 10.0000 mg | Freq: Once | INTRAVENOUS | Status: DC | PRN

## 2023-06-25 MED ORDER — PHENOL 1.4 % MT LIQD: 2.0000 | OROMUCOSAL | Status: DC | PRN

## 2023-06-25 MED ORDER — BISACODYL 5 MG PO TBEC
20.0000 mg | DELAYED_RELEASE_TABLET | Freq: Once | ORAL | Status: DC
Start: 2023-06-25 — End: 2023-06-25

## 2023-06-25 MED ORDER — TRAMADOL HCL 50 MG PO TABS
50.0000 mg | ORAL_TABLET | Freq: Four times a day (QID) | ORAL | 0 refills | Status: DC | PRN
  Filled 2023-06-25: qty 20, 3d supply, fill #0

## 2023-06-25 MED ORDER — METOPROLOL TARTRATE 5 MG/5ML IV SOLN: 5.0000 mg | Freq: Four times a day (QID) | INTRAVENOUS | Status: DC | PRN

## 2023-06-25 MED ORDER — BUPIVACAINE-EPINEPHRINE (PF) 0.25% -1:200000 IJ SOLN
INTRAMUSCULAR | Status: DC | PRN
  Administered 2023-06-25: 60 mL via PERINEURAL

## 2023-06-25 MED ORDER — LACTATED RINGERS IV SOLN: Freq: Three times a day (TID) | INTRAVENOUS | Status: DC | PRN

## 2023-06-25 MED ORDER — ONDANSETRON HCL 4 MG/2ML IJ SOLN: 4.0000 mg | Freq: Once | INTRAMUSCULAR | Status: DC | PRN

## 2023-06-25 MED ORDER — CALCIUM POLYCARBOPHIL 625 MG PO TABS
625.0000 mg | ORAL_TABLET | Freq: Two times a day (BID) | ORAL | Status: DC
  Administered 2023-06-25 – 2023-06-27 (×4): 625 mg via ORAL
  Filled 2023-06-25 (×4): qty 1

## 2023-06-25 MED ORDER — MIDAZOLAM HCL 2 MG/2ML IJ SOLN
INTRAMUSCULAR | Status: AC
  Filled 2023-06-25: qty 2

## 2023-06-25 SURGICAL SUPPLY — 101 items
ADAPTER GOLDBERG URETERAL (ADAPTER) IMPLANT
APPLIER CLIP 5 13 M/L LIGAMAX5 (MISCELLANEOUS) IMPLANT
APPLIER CLIP ROT 10 11.4 M/L (STAPLE) IMPLANT
BAG COUNTER SPONGE SURGICOUNT (BAG) ×2 IMPLANT
BAG URO CATCHER STRL LF (MISCELLANEOUS) ×1 IMPLANT
BLADE EXTENDED COATED 6.5IN (ELECTRODE) IMPLANT
CANNULA REDUCER 12-8 DVNC XI (CANNULA) IMPLANT
CATH FOLEY 3WAY 30CC 16FR (CATHETERS) IMPLANT
CATH URETL OPEN 5X70 (CATHETERS) IMPLANT
CHLORAPREP W/TINT 26 (MISCELLANEOUS) IMPLANT
CLIP APPLIE 5 13 M/L LIGAMAX5 (MISCELLANEOUS) IMPLANT
CLIP APPLIE ROT 10 11.4 M/L (STAPLE) IMPLANT
CLOTH BEACON ORANGE TIMEOUT ST (SAFETY) ×1 IMPLANT
COVER SURGICAL LIGHT HANDLE (MISCELLANEOUS) ×2 IMPLANT
COVER TIP SHEARS 8 DVNC (MISCELLANEOUS) ×2 IMPLANT
DEFOGGER SCOPE WARMER CLEARIFY (MISCELLANEOUS) IMPLANT
DEVICE TROCAR PUNCTURE CLOSURE (ENDOMECHANICALS) IMPLANT
DRAIN CHANNEL 19F RND (DRAIN) IMPLANT
DRAPE ARM DVNC X/XI (DISPOSABLE) ×4 IMPLANT
DRAPE COLUMN DVNC XI (DISPOSABLE) ×2 IMPLANT
DRAPE SURG IRRIG POUCH 19X23 (DRAPES) ×1 IMPLANT
DRIVER NDL LRG 8 DVNC XI (INSTRUMENTS) ×2 IMPLANT
DRIVER NDLE LRG 8 DVNC XI (INSTRUMENTS) ×1 IMPLANT
DRSG OPSITE POSTOP 4X10 (GAUZE/BANDAGES/DRESSINGS) IMPLANT
DRSG OPSITE POSTOP 4X6 (GAUZE/BANDAGES/DRESSINGS) IMPLANT
DRSG OPSITE POSTOP 4X8 (GAUZE/BANDAGES/DRESSINGS) IMPLANT
DRSG TEGADERM 2-3/8X2-3/4 SM (GAUZE/BANDAGES/DRESSINGS) ×5 IMPLANT
DRSG TEGADERM 4X4.75 (GAUZE/BANDAGES/DRESSINGS) IMPLANT
ELECT PENCIL ROCKER SW 15FT (MISCELLANEOUS) ×2 IMPLANT
ELECT REM PT RETURN 15FT ADLT (MISCELLANEOUS) ×1 IMPLANT
ENDOLOOP SUT PDS II 0 18 (SUTURE) IMPLANT
EVACUATOR SILICONE 100CC (DRAIN) IMPLANT
GAUZE SPONGE 2X2 8PLY STRL LF (GAUZE/BANDAGES/DRESSINGS) ×2 IMPLANT
GLOVE ECLIPSE 8.0 STRL XLNG CF (GLOVE) ×3 IMPLANT
GLOVE INDICATOR 8.0 STRL GRN (GLOVE) ×6 IMPLANT
GLOVE SURG LX STRL 8.0 MICRO (GLOVE) ×2 IMPLANT
GOWN SRG XL LVL 4 BRTHBL STRL (GOWNS) ×1 IMPLANT
GOWN STRL REUS W/ TWL XL LVL3 (GOWN DISPOSABLE) ×10 IMPLANT
GRASPER SUT TROCAR 14GX15 (MISCELLANEOUS) IMPLANT
GRASPER TIP-UP FEN DVNC XI (INSTRUMENTS) ×1 IMPLANT
GUIDEWIRE ANG ZIPWIRE 038X150 (WIRE) IMPLANT
GUIDEWIRE STR DUAL SENSOR (WIRE) IMPLANT
HOLDER FOLEY CATH W/STRAP (MISCELLANEOUS) ×1 IMPLANT
IRRIG SUCT STRYKERFLOW 2 WTIP (MISCELLANEOUS) ×1 IMPLANT
IRRIGATION SUCT STRKRFLW 2 WTP (MISCELLANEOUS) ×1 IMPLANT
KIT PROCEDURE DVNC SI (MISCELLANEOUS) ×2 IMPLANT
KIT SIGMOIDOSCOPE (SET/KITS/TRAYS/PACK) IMPLANT
KIT TURNOVER KIT A (KITS) IMPLANT
MANIFOLD NEPTUNE II (INSTRUMENTS) ×2 IMPLANT
NDL INSUFFLATION 14GA 120MM (NEEDLE) ×1 IMPLANT
NEEDLE INSUFFLATION 14GA 120MM (NEEDLE) ×1 IMPLANT
PACK CARDIOVASCULAR III (CUSTOM PROCEDURE TRAY) ×1 IMPLANT
PACK COLON (CUSTOM PROCEDURE TRAY) ×2 IMPLANT
PACK CYSTO (CUSTOM PROCEDURE TRAY) ×2 IMPLANT
PAD POSITIONING PINK XL (MISCELLANEOUS) ×1 IMPLANT
PROTECTOR NERVE ULNAR (MISCELLANEOUS) ×2 IMPLANT
RELOAD STAPLE 45 3.5 BLU DVNC (STAPLE) IMPLANT
RELOAD STAPLE 45 4.3 GRN DVNC (STAPLE) IMPLANT
RELOAD STAPLE 60 3.5 BLU DVNC (STAPLE) IMPLANT
RELOAD STAPLE 60 4.3 GRN DVNC (STAPLE) IMPLANT
RETRACTOR WND ALEXIS 18 MED (MISCELLANEOUS) IMPLANT
RTRCTR WOUND ALEXIS 18CM MED (MISCELLANEOUS) IMPLANT
SCISSORS LAP 5X35 DISP (ENDOMECHANICALS) ×2 IMPLANT
SCISSORS MNPLR CVD DVNC XI (INSTRUMENTS) ×1 IMPLANT
SEAL UNIV 5-12 XI (MISCELLANEOUS) ×4 IMPLANT
SEALER VESSEL EXT DVNC XI (MISCELLANEOUS) ×1 IMPLANT
SOL ELECTROSURG ANTI STICK (MISCELLANEOUS) ×1 IMPLANT
SOLUTION ELECTROSURG ANTI STCK (MISCELLANEOUS) ×2 IMPLANT
SPIKE FLUID TRANSFER (MISCELLANEOUS) ×1 IMPLANT
STAPLER 45 SUREFORM DVNC (STAPLE) IMPLANT
STAPLER 60 SUREFORM DVNC (STAPLE) IMPLANT
STAPLER ECHELON POWER CIR 29 (STAPLE) IMPLANT
STAPLER ECHELON POWER CIR 31 (STAPLE) IMPLANT
STAPLER RELOAD 3.5X45 BLU DVNC (STAPLE) IMPLANT
STAPLER RELOAD 3.5X60 BLU DVNC (STAPLE) IMPLANT
STAPLER RELOAD 4.3X45 GRN DVNC (STAPLE) IMPLANT
STAPLER RELOAD 4.3X60 GRN DVNC (STAPLE) ×2 IMPLANT
STOPCOCK 4 WAY LG BORE MALE ST (IV SETS) ×4 IMPLANT
SURGILUBE 2OZ TUBE FLIPTOP (MISCELLANEOUS) IMPLANT
SUT MNCRL AB 4-0 PS2 18 (SUTURE) ×2 IMPLANT
SUT PDS AB 1 CT1 27 (SUTURE) ×4 IMPLANT
SUT PROLENE 0 CT 2 (SUTURE) IMPLANT
SUT PROLENE 2 0 KS (SUTURE) IMPLANT
SUT PROLENE 2 0 SH DA (SUTURE) IMPLANT
SUT SILK 2 0 SH CR/8 (SUTURE) IMPLANT
SUT SILK 3 0 SH CR/8 (SUTURE) ×1 IMPLANT
SUT V-LOC BARB 180 2/0GR6 GS22 (SUTURE) ×1 IMPLANT
SUT VIC AB 3-0 SH 18 (SUTURE) IMPLANT
SUT VIC AB 3-0 SH 27XBRD (SUTURE) IMPLANT
SUT VICRYL 0 UR6 27IN ABS (SUTURE) IMPLANT
SUTURE V-LC BRB 180 2/0GR6GS22 (SUTURE) IMPLANT
SYR 20ML ECCENTRIC (SYRINGE) ×1 IMPLANT
SYS LAPSCP GELPORT 120MM (MISCELLANEOUS) IMPLANT
SYS WOUND ALEXIS 18CM MED (MISCELLANEOUS) ×1 IMPLANT
SYSTEM LAPSCP GELPORT 120MM (MISCELLANEOUS) IMPLANT
SYSTEM WOUND ALEXIS 18CM MED (MISCELLANEOUS) ×2 IMPLANT
TRAY FOLEY MTR SLVR 16FR STAT (SET/KITS/TRAYS/PACK) ×1 IMPLANT
TROCAR ADV FIXATION 5X100MM (TROCAR) ×1 IMPLANT
TUBING CONNECTING 10 (TUBING) ×6 IMPLANT
TUBING INSUFFLATION 10FT LAP (TUBING) ×1 IMPLANT
TUBING UROLOGY SET (TUBING) IMPLANT

## 2023-06-25 NOTE — Op Note (Addendum)
 Preoperative diagnosis: Colovesical fistula Postoperative diagnosis: Same  Procedures performed: Cystoscopy, bilateral retrograde pyelogram with interpretation   Surgeon: Dr. Vilma Prader  Findings:  Left retrograde pyelogram confirmed without issue Right retrograde pyelogram did not go to the renal pelvis attempted to place pollick catheter over wire would not advance, wire would not advance to renal pelvis either, will plan to get renal ultrasound tomorrow. Noted irritation at the dome of the bladder consistent with fistula  Specimens: None  Drains: 16 French three-way catheter.  Indication: Perry Zamora is a 63 y.o. patient with colovesical fistula here for fistula pair with Dr. Michaell Cowing. After reviewing the management options for treatment, he elected to proceed with the above surgical procedure(s). We have discussed the potential benefits and risks of the procedure, side effects of the proposed treatment, the likelihood of the patient achieving the goals of the procedure, and any potential problems that might occur during the procedure or recuperation. Informed consent has been obtained.  Procedure in detail: Patient was consented prior to being brought back to the operating room. He was then brought back to the operating room placed on the table in supine position. General anesthesia was then induced and endotracheal tube inserted. This then placed in dorsolithotomy position and prepped and draped in the routine sterile fashion. A timeout was held.  Using a 22.5 [place] cystoscope with a 30  lens, I gently passed the scope into the patient's urethra and into the bladder under visual guidance. A 360 cystoscopic evaluation was performed with irritation in the down the bladder.  I then passed a 0. 038 Sensor wire into the left ureteral orifice and into the left renal pelvis. I then slid a 5 [place] open-ended ureteral catheter over the wire and into the ureteral orifice and injected firefly  with contrast and performed left retrograde nephrostogram and removed the wire and the 5 [place] Pollick catheter..  We then attempted to do the same on the right ureter but the wire would not advance a Glidewire was then attempted to advance and retrograde pyelogram demonstrated no passage to the proximal ureter.  After several attempts discussed with Dr. Michaell Cowing we will plan to get renal ultrasound tomorrow.  The case was then turned over to the general surgery team for completion of the surgery.  Disposition: Plan for renal ultrasound tomorrow.

## 2023-06-25 NOTE — Op Note (Signed)
 06/25/2023  3:44 PM  PATIENT:  Perry Zamora  63 y.o. male  Patient Care Team: Georgina Quint, MD as PCP - General (Internal Medicine) Joline Maxcy, MD as Referring Physician (Urology) Karie Soda, MD as Consulting Physician (General Surgery) Shellia Cleverly, DO as Consulting Physician (Gastroenterology)  PRE-OPERATIVE DIAGNOSIS: COLOVESICAL FISTULA  POST-OPERATIVE DIAGNOSIS:  COLOVESICAL FISTULA  PROCEDURE:   -ROBOTIC SIGMOID COLECTOMY WITH ANASTOMOSIS -TAKEDOWN & CLOSURE OF COLOVESICAL FISTULA -MOBILIZATION OF SPLENIC FLEXURE OF COLON -FLEXIBLE SIGMOIDOSCOPY -INTRAOPERATIVE ASSESSMENT OF TISSUE VASCULAR PERFUSION USING ICG (indocyanine green) IMMUNOFLUORESCENCE -TRANSVERSUS ABDOMINIS PLANE (TAP) BLOCK - BILATERAL  SURGEON:  Ardeth Sportsman, MD  ASSISTANT:  Angelena Form, MD  An experienced assistant was required given the standard of surgical care given the complexity of the case.  This assistant was needed for exposure, dissection, suction, tissue approximation, retraction, perception, etc  ANESTHESIA:  General endotracheal intubation anesthesia (GETA) and Regional TRANSVERSUS ABDOMINIS PLANE (TAP) nerve block -BILATERAL for perioperative & postoperative pain control at the level of the transverse abdominis & preperitoneal spaces along the flank at the anterior axillary line, from subcostal ridge to iliac crest under laparoscopic guidance provided with liposomal bupivacaine (Experel) 20mL mixed with 60 mL of bupivicaine 0.25% with epinephrine  Estimated Blood Loss (EBL):   Total I/O In: 100 [IV Piggyback:100] Out: 300 [Urine:300].   (See anesthesia record)  Delay start of Pharmacological VTE agent (>24hrs) due to concerns of significant anemia, surgical blood loss, or risk of bleeding?:  no  DRAINS: (None)  SPECIMEN:  Rectosigmoid (open end proximal) and Distal anastomotic ring (FINAL DISTAL MARGIN)  DISPOSITION OF SPECIMEN:  Pathology  COUNTS:  Sponge,  needle, & instrument counts CORRECT  PLAN OF CARE: Admit to inpatient   PATIENT DISPOSITION:  PACU - hemodynamically stable.  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  No obvious malignancy on endoscopy.  Recommendation made for minimally invasive rectosigmoid resection and takedown of fistula and possible repair.  The anatomy & physiology of the digestive tract was discussed.  The pathophysiology of  fistula between the bowel and bladder was discussed.  Natural history risks without surgery was discussed. I worked to give an overview of the disease and the frequent need to have multispecialty involvement.   I feel the risks of no intervention will lead to serious problems that outweigh the operative risks; therefore, I recommended surgery to treat the pathology.  Laparoscopic & open techniques for partial proctocolectomy with bladder repair were discussed.  Possible fecal diversion by ostomy was discussed.  We will work to preserve anal & pelvic floor function without sacrificing cure.  Need for prolonged bladder catheterization was discussed.  Risks such as bleeding, infection, abscess, leak, injury to other organs, need for repair of tissues / organs, recurrence with reoperation, possible ostomy, hernia, heart attack, death, and other risks were discussed.  I noted a good likelihood this will help address the problem.   Goals of post-operative recovery were discussed as well.  We will work to minimize complications.  An educational handout on the pathology was given as well.  Questions were answered.    The patient expresses understanding & wishes to proceed with surgery.   OR FINDINGS:   Patient had inflamed distal descending and sigmoid colon with abscess between mid sigmoid colon and left dome of bladder.  Thinning out of bladder but no major fistula.  Chronic fistula tract oversewn with bladder  repair.  No obvious metastatic disease on visceral parietal peritoneum or liver.  It is a 29mm EEA anastomosis (  mid descnding colon  connected to proximal rectum.)  It rests 14 cm from the anal verge by flexible sigmoidoscopy  CASE DATA: Type of patient?: Elective [facility] Private Case Status of Case? Elective Scheduled Infection Present At Time Of Surgery (PATOS)?  ABSCESS   DESCRIPTION:   Informed consent was confirmed.  The patient underwent general anaesthesia without difficulty.  The patient was positioned appropriately.  VTE prevention in place.  Patient underwent cystoscopy by Dr. Jennette Bill with [facility].  Inflammation dome of bladder consistent with colovesical fistula.  Left ureter easily intubated.  Right ureter more challenging to intubate.  See a separate operative report the patient was clipped, prepped, & draped in a sterile fashion.  Surgical timeout confirmed our plan.  The patient was positioned in reverse Trendelenburg.  Abdominal entry was gained using Varess technique at the left subcostal ridge on the anterior abdominal wall.  No elevated EtCO2 noted.  Port placed.  Camera inspection revealed no injury.  Extra ports were carefully placed under direct laparoscopic visualization.  Upon entering the abdomen (organ space),we encountered a phlegmon involving the sigmoid colon densely adherent to the left lateral and anterior pelvis consistent with colovesical fistula .   I reflected the greater omentum and the upper abdomen the small bowel in the upper abdomen.  The patient was carefully positioned.  The [employer] daVinci robot was docked with camera & instruments carefully placed.  Patient had very concrete dense adhesions tween sigmoid colon and anterior pelvis.  I was able to free a few of the more wispy ones to get a little more mobility.  I then decided to proceed with a medial to lateral approach.  I mobilized the rectosigmoid colon & elevated it to put the main pedicle on  tension.  I scored the base of peritoneum of the medial side of the mesentery of the elevated left colon from the ligament of Treitz to the mid rectum.   I elevated the sigmoid mesentery and entered into the retro-mesenteric plane. We were able to identify the left ureter and gonadal vessels. We kept those posterior within the retroperitoneum and elevated the left colon mesentery off that. I did isolate the inferior mesenteric artery (IMA) pedicle but did not ligate it yet.  I continued distally and got into the avascular plane posterior to the mesorectum, sparing the nervi ergentes..   I skeletonized the lymph nodes off the inferior mesenteric artery pedicle.  I went down to its takeoff from the aorta.  I isolated the inferior mesenteric vein off of the ligament of Treitz just cephalad to that as well.  After confirming the left ureter was out of the way, I went ahead and ligated the inferior mesenteric artery pedicle just near its takeoff from the aorta.  I did ligate the inferior mesenteric vein in a similar fashion.  We ensured hemostasis.  I continued medial to lateral dissection to free the left colon mesentery off the retroperitoneum going up towards the splenic flexure to allow mobility and protect the colon mesentery.  I mobilized the left colon in a lateral to medial fashion off the retroperitoneum and sidewall attachments along the line of Toldt up towards the splenic flexure to ensure good mobilization of the remaining left colon to reach into the pelvis.   We then focused on mesorectal dissection.  Freed the mesorectum off the presacral plane until I was  distal to the concerning region.  Freed off peritoneum on the lateral sidewalls as well and transected the mesentery of the lateral pedicles to get distal to the area of concern.  Came around anteriorly.  Had to carefully free off the dense thick inflammatory and concrete adhesions between the sigmoid colon and the dome of the left bladder.  Ureter  remained in the retroperitoneum and left posterior pelvis and was preserved.  Eventually layer by layer of able to free the sigmoid colon off the bladder dome.  Encountered a small abscess which was aspirated.  Saw pivoting with some granulation tissue mucosa at the left superior bladder dome consistent with a colovesical fistula.  I continued dissection of the rectosigmoid colon circumferentially.  I proceeded with dissection distally such that I had good circumferential mesorectal excision and a good margin distal to the area of concern.  I chose a region at the mid descending colon that was soft.  Despite dissection, it was apparent that the colon would not reach down the pelvis.  Therefore felt we need to mobilize the splenic flexure of the colon.  Patient was carefully positioned.  Found the greater omentum at the mid transverse colon and freed it off the mid transverse colon heading distally.  Freed greater omentum off the anterior abdominal wall and paracolic gutter and descending colon.  Able to get into the lesser sac and free the lesser omentum off as well.  This helped to identify the superficial aspects of splenic flexure of the colon.  I then alternated between mobilization from the proximal descending colon off the retroperitoneum and left kidney Gerota's fascia/fat pad with coming down to the inferior peripancreatic ridge.  Came through the visceral peritoneum of the distal transverse colon mesentery base along the inferior pancreatic ridge and freed off the distal transverse colon to connect with my prior retroperitoneal dissection on the transverse colon and splenic flexure.  With that I continued freeing off the splenic flexure mesentery off the retroperitoneum came around the corner, avoiding injury to the pancreas spleen and kidney.  Straightened out the bend with that got good mobilization of the left colon and splenic flexure to better have the colon reach down into the pelvis.  Hemostasis  look good.  Tissues appeared viable without any injury.    I chose a area of the proximal rectal stump that was soft.  I skeletonized the mesorectum at the proximal rectum.  We then chose a region for the proximal margin that would reach well for our planned anastomosis ( mid descending colon ).  Transected the colon mesentery radially to preserve good collateral and marginal artery blood supply. To assess vascular perfusion of tissues, we asked anesthesia use intravenous  indocyanine green (ICG) with IV flush.  I switched to the NIR fluorescence (Firefly mode) imaging window on the daVinci robot platform.  We were able to see good light green visualization of blood vessels with good vascular perfusion of tissues, confirming good tissue perfusion of tissues (mid descending colon and proximal rectum) planned for anastomosis. Then transected at the distal margin with a robotic stapler green load.  90% across with first firing.  Another fire for the left lateral corner  We created an extraction incision through a small Pfannenstiel incision in the suprapubic region.  Placed a wound protector.  I was able to eviscerate the rectosigmoid and descending colon out the wound.   I clamped the colon proximal to this area using a reusable pursestringer device.  Passed a  2-0 Mellody Dance needle. I transected at the descending/sigmoid junction with a scalpel. I got healthy bleeding mucosa.  We sent the rectosigmoid colon specimen off to go to pathology.  We sized the colon orifice.  I chose a 29mm EEA anvil stapler system.  I reinforced the prolene pursestring with interrupted silk "belt loop" sutures.  I placed the anvil to the open end of the proximal remaining colon and closed around it using the pursestring.    We did copious irrigation with crystalloid solution.  Hemostasis was good.  The distal end of the remaining colon easily reached down to the rectal stump     Dr Cliffton Asters scrubbed down and did gentle anal dilation and  advanced the EEA stapler up the rectal stump. The spike was brought out at the provimal end of the rectal stump under direct visualization.  I attached the anvil of the proximal colon the spike of the stapler. Anvil was tightened down and held clamped for 60 seconds.  Orientation was confirmed such that there is no twisting of the colon nor small bowel underneath the mesenteric defect. No concerning tension.  The EEA stapler was fired and held clamped for 30 seconds. The stapler was released & removed. Blue stitch is in the proximal ring.  Care was taken to ensure no other structures were incorporated within this either.  We noted 2 excellent anastomotic rings.   The colon proximal to the anastomosis was then gently occluded. The pelvis was filled with sterile irrigation.  Dr Cliffton Asters did flexible sigmoidoscopy.  Noted the anastomosis was at 14 cm from the anal verge consistent with the proximal rectum.  Intact anastomosis without active bleeding.  No mucosal ischemia.  There was a negative air leak test. There was no tension of mesentery or bowel at the anastomosis.   Tissues looked viable.  Ureters & bowel uninjured.  The anastomosis looked healthy. Greater omentum positioned down into the pelvis to help protect the anastomosis but it continued to fall back up into the left upper quadrant despite prior mobilization.  Thickened and foreshortened.  Endoluminal gas was evacuated.  Ports & wound protector removed.  We changed gloves & redraped the patient per colon SSI prevention protocol.  We aspirated the sterile irrigation.  Hemostasis was good.  Sterile unused instruments were used from this point.  I closed the skin at the port sites using Monocryl stitch and sterile dressing.  We assured hemostasis and the former ostomy wound.  Wound irrigated.  I closed the posterior rectus fascia with 0 Vicryl suture.  Anterior rectus fascia was closed using #1 PDS transversely.  Sterile dressing placed.   Patient is being  extubated go to recovery room. I had discussed postop care with the patient in detail the office & in the holding area. Instructions are written. I discussed operative findings, updated the patient's status, discussed probable steps to recovery, and gave postoperative recommendations to the patient's spouse, Rossana Krone .  Recommendations were made.  Questions were answered.  She expressed understanding & appreciation.  Ardeth Sportsman, M.D., F.A.C.S. Gastrointestinal and Minimally Invasive Surgery [facility] [address], [address] [place], [place] [address] [phone] Main / Paging  06/25/2023

## 2023-06-25 NOTE — Discharge Instructions (Signed)
 SURGERY: POST OP INSTRUCTIONS (Surgery for small bowel obstruction, colon resection, etc)   ######################################################################  EAT Gradually transition to a high fiber diet with a fiber supplement over the next few days after discharge  WALK Walk an hour a day.  Control your pain to do that.    CONTROL PAIN Control pain so that you can walk, sleep, tolerate sneezing/coughing, go up/down stairs.  HAVE A BOWEL MOVEMENT DAILY Keep your bowels regular to avoid problems.  OK to try a laxative to override constipation.  OK to use an antidairrheal to slow down diarrhea.  Call if not better after 2 tries  CALL IF YOU HAVE PROBLEMS/CONCERNS Call if you are still struggling despite following these instructions. Call if you have concerns not answered by these instructions  ######################################################################   DIET Follow a light diet the first few days at home.  Start with a bland diet such as soups, liquids, starchy foods, low fat foods, etc.  If you feel full, bloated, or constipated, stay on a ful liquid or pureed/blenderized diet for a few days until you feel better and no longer constipated. Be sure to drink plenty of fluids every day to avoid getting dehydrated (feeling dizzy, not urinating, etc.). Gradually add a fiber supplement to your diet over the next week.  Gradually get back to a regular solid diet.  Avoid fast food or heavy meals the first week as you are more likely to get nauseated. It is expected for your digestive tract to need a few months to get back to normal.  It is common for your bowel movements and stools to be irregular.  You will have occasional bloating and cramping that should eventually fade away.  Until you are eating solid food normally, off all pain medications, and back to regular activities; your bowels will not be normal. Focus on eating a low-fat, high fiber diet the rest of your life  (See Getting to Good Bowel Health, below).  CARE of your INCISION or WOUND  It is good for closed incisions and even open wounds to be washed every day.  Shower every day.  Short baths are fine.  Wash the incisions and wounds clean with soap & water.    You may leave closed incisions open to air if it is dry.   You may cover the incision with clean gauze & replace it after your daily shower for comfort.  TEGADERM:  You have clear gauze band-aid dressings over your closed incision(s).  Remove the dressings 2 days after surgery = 3/14.    If you have an open wound with a wound vac, see wound vac care instructions.    ACTIVITIES as tolerated Start light daily activities --- self-care, walking, climbing stairs-- beginning the day after surgery.  Gradually increase activities as tolerated.  Control your pain to be active.  Stop when you are tired.  Ideally, walk several times a day, eventually an hour a day.   Most people are back to most day-to-day activities in a few weeks.  It takes 4-8 weeks to get back to unrestricted, intense activity. If you can walk 30 minutes without difficulty, it is safe to try more intense activity such as jogging, treadmill, bicycling, low-impact aerobics, swimming, etc. Save the most intensive and strenuous activity for last (Usually 4-8 weeks after surgery) such as sit-ups, heavy lifting, contact sports, etc.  Refrain from any intense heavy lifting or straining until you are off narcotics for pain control.  You will have off  days, but things should improve week-by-week. DO NOT PUSH THROUGH PAIN.  Let pain be your guide: If it hurts to do something, don't do it.  Pain is your body warning you to avoid that activity for another week until the pain goes down. You may drive when you are no longer taking narcotic prescription pain medication, you can comfortably wear a seatbelt, and you can safely make sudden turns/stops to protect yourself without hesitating due to  pain. You may have sexual intercourse when it is comfortable. If it hurts to do something, stop.   MEDICATIONS Take your usually prescribed home medications unless otherwise directed.    Blood thinners:  You can restart any strong blood thinners after the second postoperative day  for example: COUMADIN (warfarin), XERELTO (rivaroxaban), ELIQUIS (apixaban), PLAVIX (clopidigrel), BRILINTA (ticagrelor), EFFIENT (prasugrel), PRADAXA (dabigatran), etc  Continue aspirin before & after surgery..     Some oozing/bleeding the first 1-2 weeks is common but should taper down & be small volume.    If you are passing many large clots or having uncontrolling bleeding, call your surgeon    PAIN CONTROL Pain after surgery or related to activity is often due to strain/injury to muscle, tendon, nerves and/or incisions.  This pain is usually short-term and will improve in a few months.  To help speed the process of healing and to get back to regular activity more quickly, DO THE FOLLOWING THINGS TOGETHER: Increase activity gradually.  DO NOT PUSH THROUGH PAIN Use Ice and/or Heat Try Gentle Massage and/or Stretching Take over the counter pain medication Take Narcotic prescription pain medication for more severe pain  Good pain control = faster recovery.  It is better to take more medicine to be more active than to stay in bed all day to avoid medications.  Increase activity gradually Avoid heavy lifting at first, then increase to lifting as tolerated over the next 6 weeks. Do not "push through" the pain.  Listen to your body and avoid positions and maneuvers than reproduce the pain.  Wait a few days before trying something more intense Walking an hour a day is encouraged to help your body recover faster and more safely.  Start slowly and stop when getting sore.  If you can walk 30 minutes without stopping or pain, you can try more intense activity (running, jogging, aerobics, cycling, swimming,  treadmill, sex, sports, weightlifting, etc.) Remember: If it hurts to do it, then don't do it! Use Ice and/or Heat You will have swelling and bruising around the incisions.  This will take several weeks to resolve. Ice packs or heating pads (6-8 times a day, 30-60 minutes at a time) will help sooth soreness & bruising. Some people prefer to use ice alone, heat alone, or alternate between ice & heat.  Experiment and see what works best for you.  Consider trying ice for the first few days to help decrease swelling and bruising; then, switch to heat to help relax sore spots and speed recovery. Shower every day.  Short baths are fine.  It feels good!  Keep the incisions and wounds clean with soap & water.   Try Gentle Massage and/or Stretching Massage at the area of pain many times a day Stop if you feel pain - do not overdo it Take over the counter pain medication This helps the muscle and nerve tissues become less irritable and calm down faster Choose ONE of the following over-the-counter anti-inflammatory medications: Acetaminophen 500mg  tabs (Tylenol) 1-2 pills with every meal  and just before bedtime (avoid if you have liver problems or if you have acetaminophen in you narcotic prescription) Naproxen 220mg  tabs (ex. Aleve, Naprosyn) 1-2 pills twice a day (avoid if you have kidney, stomach, IBD, or bleeding problems) Ibuprofen 200mg  tabs (ex. Advil, Motrin) 3-4 pills with every meal and just before bedtime (avoid if you have kidney, stomach, IBD, or bleeding problems) Take with food/snack several times a day as directed for at least 2 weeks to help keep pain / soreness down & more manageable. Take Narcotic prescription pain medication for more severe pain A prescription for strong pain control is often given to you upon discharge (for example: oxycodone/Percocet, hydrocodone/Norco/Vicodin, or tramadol/Ultram) Take your pain medication as prescribed. Be mindful that most narcotic prescriptions  contain Tylenol (acetaminophen) as well - avoid taking too much Tylenol. If you are having problems/concerns with the prescription medicine (does not control pain, nausea, vomiting, rash, itching, etc.), please call us [phone] to see if we need to switch you to a different pain medicine that will work better for you and/or control your side effects better. If you need a refill on your pain medication, you must call the office before 4 pm and on weekdays only.  By federal law, prescriptions for narcotics cannot be called into a pharmacy.  They must be filled out on paper & picked up from our office by the patient or authorized caretaker.  Prescriptions cannot be filled after 4 pm nor on weekends.    WHEN TO CALL us [phone] Severe uncontrolled or worsening pain  Fever over 101 F (38.5 C) Concerns with the incision: Worsening pain, redness, rash/hives, swelling, bleeding, or drainage Reactions / problems with new medications (itching, rash, hives, nausea, etc.) Nausea and/or vomiting Difficulty urinating Difficulty breathing Worsening fatigue, dizziness, lightheadedness, blurred vision Other concerns If you are not getting better after two weeks or are noticing you are getting worse, contact our office [phone] for further advice.  We may need to adjust your medications, re-evaluate you in the office, send you to the emergency room, or see what other things we can do to help. The clinic staff is available to answer your questions during regular business hours (8:30am-5pm).  Please don't hesitate to call and ask to speak to one of our nurses for clinical concerns.    A surgeon from [facility] is always on call at the hospitals 24 hours/day If you have a medical emergency, go to the nearest emergency room or call 911.  FOLLOW UP in our office One the day of your discharge from the hospital (or the next business weekday), please call [facility] to set up  or confirm an appointment to see your surgeon in the office for a follow-up appointment.  Usually it is 2-3 weeks after your surgery.   If you have skin staples at your incision(s), let the office know so we can set up a time in the office for the nurse to remove them (usually around 10 days after surgery). Make sure that you call for appointments the day of discharge (or the next business weekday) from the hospital to ensure a convenient appointment time. IF YOU HAVE DISABILITY OR FAMILY LEAVE FORMS, BRING THEM TO THE OFFICE FOR PROCESSING.  DO NOT GIVE THEM TO YOUR DOCTOR.  [facility] [address], [place], [place]  [address] ? [phone] - Main [phone] - Toll Free,  [phone] - Fax [email or web address]  GETTING TO GOOD BOWEL HEALTH. It is expected for your digestive tract to need a few months to get back to normal.  It is common for your bowel movements and stools to be irregular.  You will have occasional bloating and cramping that should eventually fade away.  Until you are eating solid food normally, off all pain medications, and back to regular activities; your bowels will not be normal.   Avoiding constipation The goal: ONE SOFT BOWEL MOVEMENT A DAY!    Drink plenty of fluids.  Choose water first. TAKE A FIBER SUPPLEMENT EVERY DAY THE REST OF YOUR LIFE During your first week back home, gradually add back a fiber supplement every day Experiment which form you can tolerate.   There are many forms such as powders, tablets, wafers, gummies, etc Psyllium bran (Metamucil), methylcellulose (Citrucel), Miralax or Glycolax, Benefiber, Flax Seed.  Adjust the dose week-by-week (1/2 dose/day to 6 doses a day) until you are moving your bowels 1-2 times a day.  Cut back the dose or try a different fiber product if it is giving you problems such as diarrhea or bloating. Sometimes a laxative is needed to help jump-start bowels if  constipated until the fiber supplement can help regulate your bowels.  If you are tolerating eating & you are farting, it is okay to try a gentle laxative such as double dose MiraLax, prune juice, or Milk of Magnesia.  Avoid using laxatives too often. Stool softeners can sometimes help counteract the constipating effects of narcotic pain medicines.  It can also cause diarrhea, so avoid using for too long. If you are still constipated despite taking fiber daily, eating solids, and a few doses of laxatives, call our office. Controlling diarrhea Try drinking liquids and eating bland foods for a few days to avoid stressing your intestines further. Avoid dairy products (especially milk & ice cream) for a short time.  The intestines often can lose the ability to digest lactose when stressed. Avoid foods that cause gassiness or bloating.  Typical foods include beans and other legumes, cabbage, broccoli, and dairy foods.  Avoid greasy, spicy, fast foods.  Every person has some sensitivity to other foods, so listen to your body and avoid those foods that trigger problems for you. Probiotics (such as active yogurt, Align, etc) may help repopulate the intestines and colon with normal bacteria and calm down a sensitive digestive tract Adding a fiber supplement gradually can help thicken stools by absorbing excess fluid and retrain the intestines to act more normally.  Slowly increase the dose over a few weeks.  Too much fiber too soon can backfire and cause cramping & bloating. It is okay to try and slow down diarrhea with a few doses of antidiarrheal medicines.   Bismuth subsalicylate (ex. Kayopectate, Pepto Bismol) for a few doses can help control diarrhea.  Avoid if pregnant.   Loperamide (Imodium) can slow down diarrhea.  Start with one tablet (2mg ) first.  Avoid if you are having fevers or severe pain.  ILEOSTOMY PATIENTS WILL HAVE CHRONIC DIARRHEA since their colon is not in use.    Drink plenty of liquids.   You will need to drink even more glasses of water/liquid a day to avoid getting dehydrated. Record output from your ileostomy.  Expect to empty the bag every 3-4 hours at first.  Most people with a permanent ileostomy empty their bag 4-6 times at the least.   Use antidiarrheal medicine (especially Imodium) several times a day to avoid getting dehydrated.  Start with a dose at bedtime & breakfast.  Adjust up or down as needed.  Increase antidiarrheal medications as directed to avoid emptying the bag more than 8 times a day (every 3 hours). Work with your wound ostomy nurse to learn care for your ostomy.  See ostomy care instructions. TROUBLESHOOTING IRREGULAR BOWELS 1) Start with a soft & bland diet. No spicy, greasy, or fried foods.  2) Avoid gluten/wheat or dairy products from diet to see if symptoms improve. 3) Miralax 17gm or flax seed mixed in 8oz. water or juice-daily. May use 2-4 times a day as needed. 4) Gas-X, Phazyme, etc. as needed for gas & bloating.  5) Prilosec (omeprazole) over-the-counter as needed 6)  Consider probiotics (Align, Activa, etc) to help calm the bowels down  Call your doctor if you are getting worse or not getting better.  Sometimes further testing (cultures, endoscopy, X-ray studies, CT scans, bloodwork, etc.) may be needed to help diagnose and treat the cause of the diarrhea. [facility] [address], [address], [place], [place]  [address] [phone] - Main.    [phone]  - Toll Free.   [phone] - Fax [email or web address]

## 2023-06-25 NOTE — Interval H&P Note (Signed)
 History and Physical Interval Note:  06/25/2023 11:20 AM  Perry Zamora  has presented today for surgery, with the diagnosis of COLOVESICAL FISTULA.  The various methods of treatment have been discussed with the patient and family. After consideration of risks, benefits and other options for treatment, the patient has consented to  Procedure(s): ROBOTIC RESECTION OF COLON RECTOSIGMOID, POSSIBLE BLADDER REPAIR (N/A) POSSIBLE OSTOMY (N/A) SIGMOIDOSCOPY, FLEXIBLE (N/A) CYSTOSCOPY with FIREFLY INJECTION (N/A) as a surgical intervention.  The patient's history has been reviewed, patient examined, no change in status, stable for surgery.  I have reviewed the patient's chart and labs.  Questions were answered to the patient's satisfaction.    I have re-reviewed the the patient's records, history, medications, and allergies.  I have re-examined the patient.  I again discussed intraoperative plans and goals of post-operative recovery.  The patient agrees to proceed.  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 No pertinent surgical history.  Social History   Socioeconomic History   Marital status: Married    Spouse name: Not on file   Number of children: Not on file   Years of education: Not on file   Highest education level: Not on file  Occupational History    Not on file  Tobacco Use   Smoking status: Never   Smokeless tobacco: Never  Vaping Use   Vaping status: Never Used  Substance and Sexual Activity   Alcohol use: Yes    Comment: occasionally   Drug use: Never   Sexual activity: Not on file  Other Topics Concern   Not on file  Social History Narrative   Family originally from [place]   Social Drivers of Health   Financial Resource Strain: Low Risk  (12/06/2022)   Overall Financial Resource Strain (CARDIA)    Difficulty of Paying Living Expenses: Not very hard  Food Insecurity: No Food Insecurity (12/06/2022)   Hunger Vital Sign    Worried About Running Out of Food in the Last Year: Never true    Ran Out of Food in the Last Year: Never true  Transportation Needs: No Transportation Needs (12/06/2022)   PRAPARE - Administrator, Civil Service (Medical): No    Lack of Transportation (Non-Medical): No  Physical Activity: Sufficiently Active (12/06/2022)   Exercise Vital Sign    Days of Exercise per Week: 3 days    Minutes of Exercise per Session: 60 min  Stress: Stress Concern Present (12/06/2022)   [employer] of Occupational Health - Occupational Stress Questionnaire    Feeling of Stress : To some extent  Social Connections: Unknown (12/06/2022)   Social Connection and Isolation Panel [NHANES]    Frequency of Communication with Friends and Family: More than three times a week    Frequency of Social Gatherings with Friends and Family: Patient declined    Attends Religious Services: Patient declined    Active Member  of Clubs or Organizations: No    Attends Banker Meetings: Not on file    Marital Status: Married  Intimate Partner Violence: Unknown (07/16/2021)   Received from [employer], [facility]   HITS    Physically Hurt: Not on file    Insult or Talk Down To: Not on file    Threaten Physical Harm: Not on file    Scream or Curse: Not on file    Family History  Problem Relation Age of  Onset   Colon cancer Neg Hx    Colon polyps Neg Hx    Esophageal cancer Neg Hx    Rectal cancer Neg Hx    Stomach cancer Neg Hx     Medications Prior to Admission  Medication Sig Dispense Refill Last Dose/Taking   bisacodyl 5 MG EC tablet Take according to your procedure colon prep instructions 4 tablet 0 06/24/2023   diclofenac (VOLTAREN) 75 MG EC tablet Take 1 tablet (75 mg) by mouth 2 times daily as needed. 30 tablet 1 Past Month   diclofenac (VOLTAREN) 75 MG EC tablet Take 1 tablet  by mouth 2 times daily. Do not take any other NSAIDs including but not limited to ibuprofen, goody powders & aleve while taking this medication. Take with food. Drink plenty of water (at least 6 glasses daily). Start medication after Medrol DosePak 60 tablet 2 Past Month   escitalopram (LEXAPRO) 10 MG tablet Take 1 tablet (10 mg) by mouth daily. (Patient taking differently: Take 10 mg by mouth daily as needed (anxiety).) 90 tablet 1 06/24/2023   hydrochlorothiazide (HYDRODIURIL) 25 MG tablet Take 1 tablet (25 mg total) by mouth every morning. 90 tablet 3 06/24/2023   losartan (COZAAR) 50 MG tablet Take 1 tablet (50 mg total) by mouth daily. 90 tablet 3 06/24/2023   Melatonin 10 MG CAPS Take 10 mg by mouth at bedtime.   Past Month   metroNIDAZOLE (FLAGYL) 500 MG tablet Take according to your procedure colon prep instructions 6 tablet 0 06/24/2023   neomycin (MYCIFRADIN) 500 MG tablet Take according to your procedure colon prep instructions 6 tablet 0 06/24/2023   ondansetron (ZOFRAN-ODT) 4 MG disintegrating tablet Dissolve 1 tablet (4 mg total) by mouth every 8 (eight) hours as needed for nausea for up to 7 days. 2 tablet 0 06/24/2023   tamsulosin (FLOMAX) 0.4 MG CAPS capsule Take 1 capsule (0.4 mg total) by mouth daily. 90 capsule 3 06/24/2023   Hypromellose (VISTA GONIO DRY EYE RELIEF) 2.5 % SOLN Place 1 drop into both eyes 4 (four) times daily as needed for dry eyes (Patient not taking: Reported on 06/12/2023) 15 mL 1  More than a month    Current Facility-Administered Medications  Medication Dose Route Frequency Provider Last Rate Last Admin   bupivacaine liposome (EXPAREL) 1.3 % injection 266 mg  20 mL Infiltration Once Karie Soda, MD       cefoTEtan (CEFOTAN) 2 g in sodium chloride 0.9 % 100 mL IVPB  2 g Intravenous On Call to OR Karie Soda, MD       lactated ringers infusion   Intravenous Continuous Kaylyn Layer, MD 10 mL/hr at 06/25/23 0940 New Bag at 06/25/23 0940     No Known Allergies  BP (!) (P) 132/99   Pulse (P) 72   Temp (P) 98.7 F (37.1 C) (Oral)   Resp (P) 17   Ht 5\' 3"  (1.6 m)   Wt 74.4 kg   SpO2 (P) 99%  BMI 29.06 kg/m   Labs: No results found for this or any previous visit (from the past 48 hours).  Imaging / Studies: No results found.   Ardeth Sportsman, M.D., F.A.C.S. Gastrointestinal and Minimally Invasive Surgery [facility] [address] [place], [place] [address] [phone] Main / Paging  06/25/2023 11:20 AM    Ardeth Sportsman

## 2023-06-25 NOTE — H&P (Signed)
 06/25/2023    REFERRING PHYSICIAN: Joline Maxcy, MD  Patient Care Team: Edwina Barth, MD as PCP - General (Internal Medicine) Doristine Locks, DO (Gastroenterology)  PROVIDER: Jarrett Soho, MD  [facility] MRN: [id number] DOB: 07/31/1960  SUBJECTIVE   Chief Complaint: New Consultation   Perry Zamora is a 63 y.o. 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 Family originally from [place]. He has had problems with some hematuria and urinary discomfort for the past few years. Actually was admitted with colitis and a UTI in the hospital. Improved with antibiotics. Eventually had follow-up colonoscopy which showed some diverticulosis in the left colon but nothing too severe. No masses or polyps. Patient had recurrent UTIs and worsening discomfort. He also found to have elevated PSA. Was getting a CAT scan in anticipation of possible prostate biopsies with an pneumaturia and inflammation between sigmoid colon and dome of the bladder noted. Suspicious for colovesical fistula. Office cystoscopy raise suspicion of a fistula to the left superior dome of the bladder as well. Surgical consultation requested. Patient here today with his wife and sister. They note he tends to avoid doctors. He notes he has had blood in his urine and UTIs for the past few years. Usually moves his bowels every day. No bad bouts of constipation or diarrhea. Does not smoke. Nondiabetic. He does a lot of driving and walking at work. No new exertional chest pain. No cardiac or pulmonary issues. He has never had any abdominal surgery. Has some hypertension controlled medications. Otherwise rather active. No personal or family history of inflammatory bowel disease that he is aware of.  Ready for surgery    Medical History:  History reviewed. No pertinent past medical history.  Patient  Active Problem List  Diagnosis  Colovesical fistula  History of colonic diverticulitis  Elevated PSA  Recurrent UTI (urinary tract infection)   History reviewed. No pertinent surgical history.   No Known Allergies  Current Outpatient Medications on File Prior to Visit  Medication Sig Dispense Refill  hydroCHLOROthiazide (HYDRODIURIL) 25 MG tablet Take 25 mg by mouth every morning  losartan (COZAAR) 50 MG tablet Take 50 mg by mouth once daily  tamsulosin (FLOMAX) 0.4 mg capsule Take 0.4 mg by mouth once daily   No current facility-administered medications on file prior to visit.   History reviewed. No pertinent family history.   Social History   Tobacco Use  Smoking Status Never  Smokeless Tobacco Never    Social History   Socioeconomic History  Marital status: Married  Tobacco Use  Smoking status: Never  Smokeless tobacco: Never  Substance and Sexual Activity  Alcohol use: Never  Drug use: Never  Social History Narrative  Family originally from [place]  Patient bilingual.   Social Drivers of Health   Financial Resource Strain: Low Risk (12/06/2022)  Received from [facility]  Overall Financial Resource Strain (CARDIA)  Difficulty of Paying Living Expenses: Not very hard  Food Insecurity: No Food Insecurity (12/06/2022)  Received from [facility]  Hunger Vital Sign  Worried About Running Out of Food in the Last Year: Never true  Ran Out of Food in the Last Year: Never true  Transportation Needs: No Transportation Needs (12/06/2022)  Received from [facility] - Transportation  Lack of Transportation (Medical): No  Lack of Transportation (Non-Medical): No  Physical Activity: Sufficiently Active (12/06/2022)  Received from [facility]  Exercise Vital Sign  Days of Exercise per Week: 3 days  Minutes of Exercise per Session: 60 min  Stress: Stress Concern Present (12/06/2022)  Received from [facility] -  Occupational Stress Questionnaire  Feeling of Stress : To some extent  Social Connections: Unknown (12/06/2022)  Received from [facility]  Social Connection and Isolation Panel [NHANES]  Frequency of Communication with Friends and Family: More than three times a week  Frequency of Social Gatherings with Friends and Family: Patient declined  Attends Religious Services: Patient declined  Database administrator or Organizations: No  Marital Status: Married  Housing Stability: Unknown (05/26/2023)  Housing Stability Vital Sign  Homeless in the Last Year: No   ############################################################  Review of Systems: A complete review of systems (ROS) was obtained from the patient.  We have reviewed this information and discussed as appropriate with the patient.  See HPI as well for other pertinent ROS.  Constitutional: No fevers, chills, sweats. Weight stable Eyes: No vision changes, No discharge HENT: No sore throats, nasal drainage Lymph: No neck swelling, No bruising easily Pulmonary: No cough, productive sputum CV: No orthopnea, PND . No exertional chest/neck/shoulder/arm pain. Patient can walk 20 minutes without difficulty.   GI: No personal nor family history of GI/colon cancer, inflammatory bowel disease, irritable bowel syndrome, allergy such as Celiac Sprue, dietary/dairy problems, colitis, ulcers nor gastritis. No recent sick contacts/gastroenteritis. No travel outside the country. No changes in diet.  Renal: No UTIs, No hematuria Genital: No drainage, bleeding, masses Musculoskeletal: No severe joint pain. Good ROM major joints Skin: No sores or lesions Heme/Lymph: No easy bleeding. No swollen lymph nodes Neuro: No active seizures. No facial droop Psych: No hallucinations. No agitation  OBJECTIVE   Vitals:  05/26/23 1353 05/26/23 1354  BP: 124/78  Pulse: (!) 112  Temp: 36.7 C (98.1 F)  SpO2: 96%  Weight: 80.3 kg (177 lb)  Height: 162.6 cm  (5\' 4" )  PainSc: 0-No pain  PainLoc: Rectum   Body mass index is 30.38 kg/m.  PHYSICAL EXAM:  Constitutional: Not cachectic. Hygeine adequate. Vitals signs as above.  Eyes: Wears glasses - vision corrected,Pupils reactive, normal extraocular movements. Sclera nonicteric Neuro: CN II-XII intact. No major focal sensory defects. No major motor deficits. Lymph: No head/neck/groin lymphadenopathy Psych: No severe agitation. No severe anxiety. Judgment & insight Adequate, Oriented x4, HENT: Normocephalic, Mucus membranes moist. No thrush. Hearing: adequate Neck: Supple, No tracheal deviation. No obvious thyromegaly Chest: No pain to chest wall compression. Good respiratory excursion. No audible wheezing CV: Pulses intact. regular. No major extremity edema Ext: No obvious deformity or contracture. Edema: Not present. No cyanosis Skin: No major subcutaneous nodules. Warm and dry Musculoskeletal: Severe joint rigidity not present. No obvious clubbing. No digital petechiae. Mobility: no assist device moving easily without restrictions  Abdomen: Obese Soft. Nondistended. Nontender. Hernia: Not present. Diastasis recti: Mild supraumbilical midline. No hepatomegaly. No splenomegaly.  Genital/Pelvic: Inguinal hernia: Not present. Inguinal lymph nodes: without lymphadenopathy nor hidradenitis.   Rectal:  Perianal skin Clean with good hygiene  Pruritis ani: Not present Pilonidal disease: Not present Condyloma / warts: Not present  Anal fissure: Not present Perirectal abscess/fistula Not present External hemorrhoids Not present  Digital and anoscopic rectal exam Deferred  Sphincter tone Normal   Patient examined with patient in decubitus position .  ###################################    ###################################################################  Labs, Imaging and Diagnostic Testing:  Located in 'Care Everywhere' section of  Epic EMR chart  PRIOR [facility] CLINIC NOTES:  Not  applicable  SURGERY NOTES:  Not applicable  PATHOLOGY:  Located in 'Care Everywhere' section of Epic EMR chart  Assessment and Plan:  DIAGNOSES:  Diagnoses and all orders for this visit:  Colovesical fistula - polyethylene glycol (MIRALAX) powder; Take 238 g by mouth once for 1 dose Take according to your procedure prep instructions. - bisacodyL (DULCOLAX) 5 mg EC tablet; Take according to your procedure colon prep instructions - metroNIDAZOLE (FLAGYL) 500 MG tablet; Take according to your procedure colon prep instructions - neomycin 500 mg tablet; Take according to your procedure colon prep instructions - ondansetron (ZOFRAN-ODT) 4 MG disintegrating tablet; Take 1 tablet (4 mg total) by mouth every 8 (eight) hours as needed for Nausea for up to 7 days  History of colonic diverticulitis - polyethylene glycol (MIRALAX) powder; Take 238 g by mouth once for 1 dose Take according to your procedure prep instructions. - bisacodyL (DULCOLAX) 5 mg EC tablet; Take according to your procedure colon prep instructions - metroNIDAZOLE (FLAGYL) 500 MG tablet; Take according to your procedure colon prep instructions - neomycin 500 mg tablet; Take according to your procedure colon prep instructions - ondansetron (ZOFRAN-ODT) 4 MG disintegrating tablet; Take 1 tablet (4 mg total) by mouth every 8 (eight) hours as needed for Nausea for up to 7 days  Elevated PSA - polyethylene glycol (MIRALAX) powder; Take 238 g by mouth once for 1 dose Take according to your procedure prep instructions. - bisacodyL (DULCOLAX) 5 mg EC tablet; Take according to your procedure colon prep instructions - metroNIDAZOLE (FLAGYL) 500 MG tablet; Take according to your procedure colon prep instructions - neomycin 500 mg tablet; Take according to your procedure colon prep instructions - ondansetron (ZOFRAN-ODT) 4 MG disintegrating tablet; Take 1 tablet (4 mg total) by mouth every 8 (eight) hours as needed for Nausea for up to  7 days  Recurrent UTI (urinary tract infection) - polyethylene glycol (MIRALAX) powder; Take 238 g by mouth once for 1 dose Take according to your procedure prep instructions. - bisacodyL (DULCOLAX) 5 mg EC tablet; Take according to your procedure colon prep instructions - metroNIDAZOLE (FLAGYL) 500 MG tablet; Take according to your procedure colon prep instructions - neomycin 500 mg tablet; Take according to your procedure colon prep instructions - ondansetron (ZOFRAN-ODT) 4 MG disintegrating tablet; Take 1 tablet (4 mg total) by mouth every 8 (eight) hours as needed for Nausea for up to 7 days  Hematuria, unspecified type - polyethylene glycol (MIRALAX) powder; Take 238 g by mouth once for 1 dose Take according to your procedure prep instructions. - bisacodyL (DULCOLAX) 5 mg EC tablet; Take according to your procedure colon prep instructions - metroNIDAZOLE (FLAGYL) 500 MG tablet; Take according to your procedure colon prep instructions - neomycin 500 mg tablet; Take according to your procedure colon prep instructions - ondansetron (ZOFRAN-ODT) 4 MG disintegrating tablet; Take 1 tablet (4 mg total) by mouth every 8 (eight) hours as needed for Nausea for up to 7 days    ASSESSMENT/PLAN  Gentleman with episodes of recurrent colon infection for June to be diverticulitis resolved with antibiotics with complex attack in 2021 and UTIs. Hematuria. Now with evidence of colovesical fistula between sigmoid colon and left upper dome of the bladder by CT scan and cystoscopy.  I think he would benefit from segmental colonic resection and takedown the fistula. Reasonable for robotic approach in the hopes of doing immediate anastomosis and minimizing the risk of  a colostomy. Will mark just in case. Will have urology do cystoscopy and firefly injection to help see and protect the bladder and ureters  The anatomy & physiology of the digestive tract was discussed. The pathophysiology of fistula between the  bowel and bladder was discussed. Natural history risks without surgery was discussed. I worked to give an overview of the disease and the frequent need to have multispecialty involvement.   I feel the risks of no intervention will lead to serious problems that outweigh the operative risks; therefore, I recommended surgery to treat the pathology. Laparoscopic & open techniques for partial proctocolectomy with bladder repair were discussed. Possible fecal diversion by ostomy was discussed. We will work to preserve anal & pelvic floor function without sacrificing cure. Need for prolonged bladder catheterization was discussed.  Risks such as bleeding, infection, abscess, leak, injury to other organs, need for repair of tissues / organs, recurrence with reoperation, possible ostomy, hernia, heart attack, death, and other risks were discussed. I noted a good likelihood this will help address the problem. Goals of post-operative recovery were discussed as well. We will work to minimize complications. An educational handout on the pathology was given as well. Questions were answered.   The patient expresses understanding & wishes to proceed with surgery. Wife and sister very involved in care and are here to help as well   Ardeth Sportsman, MD, FACS, MASCRS Esophageal, Gastrointestinal & Colorectal Surgery Robotic and Minimally Invasive Surgery  [facility] [address], [address] [place], [place] [address] [phone] Fax [phone] Main  CONTACT INFORMATION: Weekday (9AM-5PM): Call [facility] main office at [phone] Weeknight (5PM-9AM) or Weekend/Holiday: Check EPIC "Web Links" tab & use "AMION" (password " [email or web address]") for General Surgery [facility] coverage  Please, DO NOT use SecureChat  (it is not reliable communication to reach operating surgeons & will lead to a delay in care).   Epic staff messaging available for outptient concerns needing 1-2  business day response.     06/25/2023

## 2023-06-25 NOTE — Plan of Care (Signed)
  Problem: Education: Goal: Understanding of discharge needs will improve Outcome: Progressing Goal: Verbalization of understanding of the causes of altered bowel function will improve Outcome: Progressing   Problem: Activity: Goal: Ability to tolerate increased activity will improve Outcome: Progressing   Problem: Bowel/Gastric: Goal: Gastrointestinal status for postoperative course will improve Outcome: Progressing   Problem: Health Behavior/Discharge Planning: Goal: Identification of community resources to assist with postoperative recovery needs will improve Outcome: Progressing   Problem: Nutritional: Goal: Will attain and maintain optimal nutritional status will improve Outcome: Progressing   Problem: Clinical Measurements: Goal: Postoperative complications will be avoided or minimized Outcome: Progressing

## 2023-06-25 NOTE — Anesthesia Procedure Notes (Signed)
 Procedure Name: Intubation Date/Time: 06/25/2023 12:39 PM  Performed by: Dennison Nancy, CRNAPre-anesthesia Checklist: Patient identified, Emergency Drugs available, Suction available, Patient being monitored and Timeout performed Patient Re-evaluated:Patient Re-evaluated prior to induction Oxygen Delivery Method: Circle system utilized Preoxygenation: Pre-oxygenation with 100% oxygen Induction Type: IV induction Ventilation: Mask ventilation without difficulty Laryngoscope Size: Mac and 4 Grade View: Grade I Tube type: Oral Tube size: 7.5 mm Number of attempts: 1 Airway Equipment and Method: Stylet Placement Confirmation: ETT inserted through vocal cords under direct vision, positive ETCO2, CO2 detector and breath sounds checked- equal and bilateral Secured at: 22 cm Tube secured with: Tape (secured with pink Hy-tape) Dental Injury: Teeth and Oropharynx as per pre-operative assessment

## 2023-06-25 NOTE — Transfer of Care (Signed)
 Immediate Anesthesia Transfer of Care Note  Patient: Perry Zamora  Procedure(s) Performed: ROBOTIC RESECTION OF COLON RECTOSIGMOID, POSSIBLE BLADDER REPAIR POSSIBLE OSTOMY SIGMOIDOSCOPY, FLEXIBLE CYSTOSCOPY with FIREFLY INJECTION  Patient Location: PACU  Anesthesia Type:General  Level of Consciousness: awake, alert , and oriented  Airway & Oxygen Therapy: Patient Spontanous Breathing and Patient connected to face mask oxygen  Post-op Assessment: Report given to RN and Post -op Vital signs reviewed and stable  Post vital signs: stable  Last Vitals:  Vitals Value Taken Time  BP 154/94 06/25/23 1545  Temp    Pulse 68 06/25/23 1547  Resp 14 06/25/23 1547  SpO2 100 % 06/25/23 1547  Vitals shown include unfiled device data.  Last Pain:  Vitals:   06/25/23 0917  TempSrc:   PainSc: 0-No pain         Complications: No notable events documented.

## 2023-06-25 NOTE — Plan of Care (Signed)
  Problem: Education: Goal: Understanding of discharge needs will improve Outcome: Progressing Goal: Verbalization of understanding of the causes of altered bowel function will improve Outcome: Progressing

## 2023-06-25 NOTE — Anesthesia Postprocedure Evaluation (Signed)
 Anesthesia Post Note  Patient: Perry Zamora  Procedure(s) Performed: ROBOTIC SIGMOID COLECTOMY WITH ANASTOMOSIS, TAKEDOWN AND CLOSURE OF COLOVESICAL FISTULA, MOBILIZATION OF SPLENIC FLEXURE OF COLON, INTRAOPERATIVE ASSESSMENT OF TISSUE VASCULAR PERFUSION USING ICG, TAP BLOCK SIGMOIDOSCOPY, FLEXIBLE CYSTOSCOPY with FIREFLY INJECTION     Anesthesia Type: General Anesthetic complications: no   No notable events documented.  Last Vitals:  Vitals:   06/25/23 1850 06/25/23 1943  BP: (!) 153/99 121/85  Pulse: 64 73  Resp: 18 18  Temp: 36.6 C 36.6 C  SpO2: 100% 99%    Last Pain:  Vitals:   06/25/23 1859  TempSrc:   PainSc: 3                  Trevor Iha

## 2023-06-26 ENCOUNTER — Encounter (HOSPITAL_COMMUNITY): Payer: Self-pay | Admitting: Surgery

## 2023-06-26 ENCOUNTER — Other Ambulatory Visit (HOSPITAL_COMMUNITY): Payer: Self-pay

## 2023-06-26 DIAGNOSIS — F419 Anxiety disorder, unspecified: Secondary | ICD-10-CM | POA: Insufficient documentation

## 2023-06-26 LAB — BASIC METABOLIC PANEL
Anion gap: 12 (ref 5–15)
BUN: 14 mg/dL (ref 6–20)
CO2: 22 mmol/L (ref 22–32)
Calcium: 8.6 mg/dL — ABNORMAL LOW (ref 8.9–10.3)
Chloride: 97 mmol/L — ABNORMAL LOW (ref 98–111)
Creatinine, Ser: 0.91 mg/dL (ref 0.61–1.24)
GFR, Estimated: >60 mL/min (ref >60)
Glucose, Bld: 132 mg/dL — ABNORMAL HIGH (ref 70–99)
Potassium: 3.7 mmol/L (ref 3.5–5.1)
Sodium: 131 mmol/L — ABNORMAL LOW (ref 135–145)

## 2023-06-26 LAB — CBC
HCT: 33.2 % — ABNORMAL LOW (ref 39.0–52.0)
Hemoglobin: 10.8 g/dL — ABNORMAL LOW (ref 13.0–17.0)
MCH: 31.5 pg (ref 26.0–34.0)
MCHC: 32.5 g/dL (ref 30.0–36.0)
MCV: 96.8 fL (ref 80.0–100.0)
Platelets: 380 10*3/uL (ref 150–400)
RBC: 3.43 MIL/uL — ABNORMAL LOW (ref 4.22–5.81)
RDW: 13.7 % (ref 11.5–15.5)
WBC: 11.8 10*3/uL — ABNORMAL HIGH (ref 4.0–10.5)
nRBC: 0 % (ref 0.0–0.2)

## 2023-06-26 LAB — MAGNESIUM: Magnesium: 1.8 mg/dL (ref 1.7–2.4)

## 2023-06-26 MED ORDER — MAGNESIUM SULFATE 2 GM/50ML IV SOLN
2.0000 g | Freq: Once | INTRAVENOUS | Status: AC
  Administered 2023-06-26: 2 g via INTRAVENOUS
  Filled 2023-06-26: qty 50

## 2023-06-26 MED ORDER — LOSARTAN POTASSIUM 50 MG PO TABS
50.0000 mg | ORAL_TABLET | Freq: Every day | ORAL | Status: DC
  Administered 2023-06-26 – 2023-06-27 (×2): 50 mg via ORAL
  Filled 2023-06-26 (×2): qty 1

## 2023-06-26 NOTE — Progress Notes (Signed)
 06/26/2023  Perry Zamora [id number] Jun 17, 1960  CARE TEAM: PCP: Georgina Quint, MD  Outpatient Care Team: Patient Care Team: Georgina Quint, MD as PCP - General (Internal Medicine) Joline Maxcy, MD as Referring Physician (Urology) Karie Soda, MD as Consulting Physician (General Surgery) Shellia Cleverly, DO as Consulting Physician (Gastroenterology)  Inpatient Treatment Team: Treatment Team:  Karie Soda, MD Vassie Loll, RN Eilleen Kempf, RN [place], Garey Ham, RN Dina Rich, RN   Problem List:   Principal Problem:   Colovesical fistula   06/25/2023  POST-OPERATIVE DIAGNOSIS:  COLOVESICAL FISTULA   PROCEDURE:   -ROBOTIC SIGMOID COLECTOMY WITH ANASTOMOSIS -TAKEDOWN & CLOSURE OF COLOVESICAL FISTULA -MOBILIZATION OF SPLENIC FLEXURE OF COLON -FLEXIBLE SIGMOIDOSCOPY -INTRAOPERATIVE ASSESSMENT OF TISSUE VASCULAR PERFUSION USING ICG (indocyanine green) IMMUNOFLUORESCENCE -TRANSVERSUS ABDOMINIS PLANE (TAP) BLOCK - BILATERAL   SURGEON:  Ardeth Sportsman, MD  OR FINDINGS:    Patient had inflamed distal descending and sigmoid colon with abscess between mid sigmoid colon and left dome of bladder.  Thinning out of bladder but no major fistula.  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  No obvious metastatic disease on visceral parietal peritoneum or liver.   It is a 29mm EEA anastomosis (  mid descnding colon  connected to proximal rectum.)  It rests 14 cm from the anal verge by flexible sigmoidoscopy   CASE DATA: Type of patient?: Elective [facility] Private Case Status of Case? Elective Scheduled Infection Present At Time Of Surgery (PATOS)?  ABSCESS  Assessment [facility] Stay = 1 days) 1 Day Post-Op    Recovering quite well so far    Plan:  ERAS protocol  Foley catheter out.  Hematuria of 1 clot otherwise clear reassuring.  Following liquids.  Advance gradually to solid diet.  Follow off IV fluids.  Hypertension.   Slowly add back in medicines gradually.  PRN (as needed) for backup  Anxiety.  Getting back on his Lexapro.  He was wondering if he could get a benzodiazepine prescription.  I am guarded against that and recommend he discuss with his primary care physician.  Hopefully Lexapro and pain control will be enough.  He seemed mostly reassured.  -monitor electrolytes & replace as needed  Keep K>4, Mg>2, Phos>3  -VTE prophylaxis- SCDs.  Anticoagulation prophyllaxis SQ as appropriate  -mobilize as tolerated to help recovery.  Enlist therapies in moderate/high risk patients as appropriate  I updated the patient's status to the patient and spouse  Recommendations were made.  Questions were answered.  They expressed understanding & appreciation.  -Disposition:  Disposition:  The patient is from: Home Anticipate discharge to:  Home Anticipated Date of Discharge is:  March 14,2025   Barriers to discharge:  Pending Clinical improvement (more likely than not)  Patient currently is NOT MEDICALLY STABLE for discharge from the hospital from a surgery standpoint.      I reviewed nursing notes, last 24 h vitals and pain scores, last 48 h intake and output, last 24 h labs and trends, and last 24 h imaging results.  I have reviewed this patient's available data, including medical history, events of note, test results, etc as part of my evaluation.   A significant portion of that time was spent in counseling. Care during the described time interval was provided by me.  This care required moderate level of medical decision making.  06/26/2023    Subjective: (Chief complaint)  Walking around the room.  Foley catheter out.  Had 1 episode of  passing a clot urinating but otherwise been clear.  Having bowel movements.  Tolerating clears.  Hoping to go home soon.  Wife in room.  Both are very appreciative.  Objective:  Vital signs:  Vitals:   06/25/23 2041 06/26/23 0018 06/26/23 0500 06/26/23  0516  BP: 133/79 (!) 150/95  (!) 143/88  Pulse: 82 66  71  Resp: 18 18  18   Temp: 98.2 F (36.8 C) (!) 97.5 F (36.4 C)  98.4 F (36.9 C)  TempSrc: Oral Oral  Oral  SpO2: 96% 100%  100%  Weight:   78.6 kg   Height:        Last BM Date : 06/24/23  Intake/Output   Yesterday:  03/12 0701 - 03/13 0700 In: 3024.2 [P.O.:1320; I.V.:1604.2; IV Piggyback:100] Out: 1530 [Urine:1530] This shift:  No intake/output data recorded.  Bowel function:  Flatus: YES  BM:  YES  Drain: (No drain)   Physical Exam:  General: Pt awake/alert in no acute distress Eyes: PERRL, normal EOM.  Sclera clear.  No icterus Neuro: CN II-XII intact w/o focal sensory/motor deficits. Lymph: No head/neck/groin lymphadenopathy Psych:  No delerium/psychosis/paranoia.  Oriented x 4 HENT: Normocephalic, Mucus membranes moist.  No thrush Neck: Supple, No tracheal deviation.  No obvious thyromegaly Chest: No pain to chest wall compression.  Good respiratory excursion.  No audible wheezing CV:  Pulses intact.  Regular rhythm.  No major extremity edema MS: Normal AROM mjr joints.  No obvious deformity  Abdomen: Soft.  Nondistended.  Nontender.  No evidence of peritonitis.  No incarcerated hernias.  Ext:   No deformity.  No mjr edema.  No cyanosis Skin: No petechiae / purpurea.  No major sores.  Warm and dry    Results:   Cultures: No results found for this or any previous visit (from the past 720 hours).  Labs: Results for orders placed or performed during the hospital encounter of 06/25/23 (from the past 48 hours)  Basic metabolic panel     Status: Abnormal   Collection Time: 06/26/23  5:19 AM  Result Value Ref Range   Sodium 131 (L) 135 - 145 mmol/L   Potassium 3.7 3.5 - 5.1 mmol/L   Chloride 97 (L) 98 - 111 mmol/L   CO2 22 22 - 32 mmol/L   Glucose, Bld 132 (H) 70 - 99 mg/dL    Comment: Glucose reference range applies only to samples taken after fasting for at least 8 hours.   BUN 14 6 - 20 mg/dL    Creatinine, Ser 2.95 0.61 - 1.24 mg/dL   Calcium 8.6 (L) 8.9 - 10.3 mg/dL   GFR, Estimated >28 >41 mL/min    Comment: (NOTE) Calculated using the CKD-EPI Creatinine Equation (2021)    Anion gap 12 5 - 15    Comment: Performed at [facility], [address], [place], [place] [address]  CBC     Status: Abnormal   Collection Time: 06/26/23  5:19 AM  Result Value Ref Range   WBC 11.8 (H) 4.0 - 10.5 K/uL   RBC 3.43 (L) 4.22 - 5.81 MIL/uL   Hemoglobin 10.8 (L) 13.0 - 17.0 g/dL   HCT 10.2 (L) 72.5 - 36.6 %   MCV 96.8 80.0 - 100.0 fL   MCH 31.5 26.0 - 34.0 pg   MCHC 32.5 30.0 - 36.0 g/dL   RDW 44.0 34.7 - 42.5 %   Platelets 380 150 - 400 K/uL   nRBC 0.0 0.0 - 0.2 %  Comment: Performed at [facility], [address] [place], [place] [address]  Magnesium     Status: None   Collection Time: 06/26/23  5:19 AM  Result Value Ref Range   Magnesium 1.8 1.7 - 2.4 mg/dL    Comment: Performed at [facility], [address], [place], [place] [address]    Imaging / Studies: DG C-Arm 1-60 Min-No Report Result Date: 06/25/2023 Fluoroscopy was utilized by the requesting physician.  No radiographic interpretation.    Medications / Allergies: per chart  Antibiotics: Anti-infectives (From admission, onward)    Start     Dose/Rate Route Frequency Ordered Stop   06/26/23 0030  cefoTEtan (CEFOTAN) 2 g in sodium chloride 0.9 % 100 mL IVPB        2 g 200 mL/hr over 30 Minutes Intravenous Every 12 hours 06/25/23 1803 06/26/23 0046   06/25/23 1400  neomycin (MYCIFRADIN) tablet 1,000 mg  Status:  Discontinued       Placed in "And" Linked Group   1,000 mg Oral 3 times per day 06/25/23 0852 06/25/23 0854   06/25/23 1400  metroNIDAZOLE (FLAGYL) tablet 1,000 mg  Status:  Discontinued       Placed in "And" Linked Group   1,000 mg Oral 3 times per day 06/25/23 0852 06/25/23 0854   06/25/23 0900  cefoTEtan (CEFOTAN) 2 g in sodium chloride 0.9 %  100 mL IVPB        2 g 200 mL/hr over 30 Minutes Intravenous On call to O.R. 06/25/23 0981 06/25/23 1651         Note: Portions of this report may have been transcribed using voice recognition software. Every effort was made to ensure accuracy; however, inadvertent computerized transcription errors may be present.   Any transcriptional errors that result from this process are unintentional.    Ardeth Sportsman, MD, FACS, MASCRS Esophageal, Gastrointestinal & Colorectal Surgery Robotic and Minimally Invasive Surgery  [facility] [address], [address] [place], [place] [address] [phone] Fax [phone] Main  CONTACT INFORMATION: Weekday (9AM-5PM): Call [facility] main office at [phone] Weeknight (5PM-9AM) or Weekend/Holiday: Check EPIC "Web Links" tab & use "AMION" (password " [email or web address]") for General Surgery [facility] coverage  Please, DO NOT use SecureChat  (it is not reliable communication to reach operating surgeons & will lead to a delay in care).   Epic staff messaging available for outptient concerns needing 1-2 business day response.      06/26/2023  7:23 AM

## 2023-06-26 NOTE — Plan of Care (Signed)
  Problem: Education: Goal: Understanding of discharge needs will improve Outcome: Progressing Goal: Verbalization of understanding of the causes of altered bowel function will improve Outcome: Progressing

## 2023-06-26 NOTE — Plan of Care (Signed)

## 2023-06-26 NOTE — Progress Notes (Signed)
   06/26/23 1320  TOC Brief Assessment  Insurance and Status Reviewed  Patient has primary care physician Yes  Home environment has been reviewed home with spouse  Prior level of function: independent  Prior/Current Home Services No current home services  Social Drivers of Health Review SDOH reviewed no interventions necessary  Readmission risk has been reviewed Yes  Transition of care needs no transition of care needs at this time

## 2023-06-27 ENCOUNTER — Other Ambulatory Visit (HOSPITAL_COMMUNITY): Payer: Self-pay

## 2023-06-27 LAB — SURGICAL PATHOLOGY

## 2023-06-27 MED ORDER — TRAMADOL HCL 50 MG PO TABS
50.0000 mg | ORAL_TABLET | Freq: Four times a day (QID) | ORAL | 0 refills | Status: DC | PRN
  Filled 2023-06-27: qty 30, 4d supply, fill #0

## 2023-06-27 MED ORDER — METHOCARBAMOL 500 MG PO TABS
500.0000 mg | ORAL_TABLET | Freq: Three times a day (TID) | ORAL | 1 refills | Status: DC | PRN
  Filled 2023-06-27: qty 20, 7d supply, fill #0
  Filled 2023-06-30: qty 20, 7d supply, fill #1

## 2023-06-27 NOTE — Discharge Summary (Addendum)
 Physician Discharge Summary    Perry Zamora MRN: [id number] DOB/AGE: June 17, 1960 = 62 y.o.  Patient Care Team: Georgina Quint, MD as PCP - General (Internal Medicine) Joline Maxcy, MD as Referring Physician (Urology) Karie Soda, MD as Consulting Physician (General Surgery) Shellia Cleverly, DO as Consulting Physician (Gastroenterology)  Admit date: 06/25/2023  Discharge date: 06/27/2023  Hospital Stay = 2 days    Discharge Diagnoses:  Principal Problem:   Colovesical fistula Active Problems:   Essential hypertension   History of colonic diverticulitis   Recurrent UTI (urinary tract infection)   Anxiety   2 Days Post-Op  06/25/2023  POST-OPERATIVE DIAGNOSIS:  COLOVESICAL FISTULA   PROCEDURE:   -ROBOTIC SIGMOID COLECTOMY WITH ANASTOMOSIS -TAKEDOWN & CLOSURE OF COLOVESICAL FISTULA -MOBILIZATION OF SPLENIC FLEXURE OF COLON -FLEXIBLE SIGMOIDOSCOPY -INTRAOPERATIVE ASSESSMENT OF TISSUE VASCULAR PERFUSION USING ICG (indocyanine green) IMMUNOFLUORESCENCE -TRANSVERSUS ABDOMINIS PLANE (TAP) BLOCK - BILATERAL   SURGEON:  Ardeth Sportsman, MD   OR FINDINGS:    Patient had inflamed distal descending and sigmoid colon with abscess between mid sigmoid colon and left dome of bladder.  Thinning out of bladder but no major fistula.  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  No obvious metastatic disease on visceral parietal peritoneum or liver.   It is a 29mm EEA anastomosis (  mid descnding colon  connected to proximal rectum.)  It rests 14 cm from the anal verge by flexible sigmoidoscopy   CASE DATA: Type of patient?: Elective [facility] Private Case Status of Case? Elective Scheduled Infection Present At Time Of Surgery (PATOS)?  ABSCESS    Consults: Case Management / Social Work, Anesthesia, and Urology  Hospital Course:   The patient underwent the surgery above.  Postoperatively, the patient gradually mobilized and advanced to a solid diet.  Pain and other  symptoms were treated aggressively.    By the time of discharge, the patient was walking well the hallways, eating food, having flatus.  Pain was well-controlled on an oral medications.  Based on meeting discharge criteria and continuing to recover, I felt it was safe for the patient to be discharged from the hospital to further recover with close followup. Postoperative recommendations were discussed in detail.  They are written as well.  Discharged Condition: good  Discharge Exam: Blood pressure 117/83, pulse 68, temperature 98.2 F (36.8 C), resp. rate 18, height 5\' 3"  (1.6 m), weight 77.7 kg, SpO2 99%.  General: Pt awake/alert/oriented x4 in No acute distress Eyes: PERRL, normal EOM.  Sclera clear.  No icterus Neuro: CN II-XII intact w/o focal sensory/motor deficits. Lymph: No head/neck/groin lymphadenopathy Psych:  No delerium/psychosis/paranoia HENT: Normocephalic, Mucus membranes moist.  No thrush Neck: Supple, No tracheal deviation Chest:  No chest wall pain w good excursion CV:  Pulses intact.  Regular rhythm MS: Normal AROM mjr joints.  No obvious deformity Abdomen: Soft.  Nondistended.  Mildly tender at incisions only.  No evidence of peritonitis.  No incarcerated hernias. Ext:  SCDs BLE.  No mjr edema.  No cyanosis Skin: No petechiae / purpura   Disposition:    Follow-up Information     Karie Soda, MD Follow up on 07/16/2023.   Specialties: General Surgery, Colon and Rectal Surgery Contact information: [address] [place] [address] [phone]                 Discharge disposition: 01-Home or Self Care       Discharge Instructions     Call  MD for:   Complete by: As directed    FEVER > 101.5 F  (temperatures < 101.5 F are not significant)   Call MD for:  extreme fatigue   Complete by: As directed    Call MD for:  persistant dizziness or light-headedness   Complete by: As directed    Call MD for:  persistant nausea and  vomiting   Complete by: As directed    Call MD for:  redness, tenderness, or signs of infection (pain, swelling, redness, odor or green/yellow discharge around incision site)   Complete by: As directed    Call MD for:  severe uncontrolled pain   Complete by: As directed    Diet - low sodium heart healthy   Complete by: As directed    Start with a bland diet such as soups, liquids, starchy foods, low fat foods, etc. the first few days at home. Gradually advance to a solid, low-fat, high fiber diet by the end of the first week at home.   Add a fiber supplement to your diet (Metamucil, etc) If you feel full, bloated, or constipated, stay on a full liquid or pureed/blenderized diet for a few days until you feel better and are no longer constipated.   Discharge instructions   Complete by: As directed    See Discharge Instructions If you are not getting better after two weeks or are noticing you are getting worse, contact our office [phone] for further advice.  We may need to adjust your medications, re-evaluate you in the office, send you to the emergency room, or see what other things we can do to help. The clinic staff is available to answer your questions during regular business hours (8:30am-5pm).  Please don't hesitate to call and ask to speak to one of our nurses for clinical concerns.    A surgeon from [facility] is always on call at the hospitals 24 hours/day If you have a medical emergency, go to the nearest emergency room or call 911.   Discharge wound care:   Complete by: As directed    It is good for closed incisions and even open wounds to be washed every day.  Shower every day.  Short baths are fine.  Wash the incisions and wounds clean with soap & water.    You may leave closed incisions open to air if it is dry.   You may cover the incision with clean gauze & replace it after your daily shower for comfort.  TEGADERM:  You have clear gauze band-aid dressings  over your closed incision(s).  Remove the dressings 3/14, two days after surgery.   Driving Restrictions   Complete by: As directed    You may drive when: - you are no longer taking narcotic prescription pain medication - you can comfortably wear a seatbelt - you can safely make sudden turns/stops without pain.   Increase activity slowly   Complete by: As directed    Start light daily activities --- self-care, walking, climbing stairs- beginning the day after surgery.  Gradually increase activities as tolerated.  Control your pain to be active.  Stop when you are tired.  Ideally, walk several times a day, eventually an hour a day.   Most people are back to most day-to-day activities in a few weeks.  It takes 4-6 weeks to get back to unrestricted, intense activity. If you can walk 30 minutes without difficulty, it is safe to try more intense activity such as jogging,  treadmill, bicycling, low-impact aerobics, swimming, etc. Save the most intensive and strenuous activity for last (Usually 4-8 weeks after surgery) such as sit-ups, heavy lifting, contact sports, etc.  Refrain from any intense heavy lifting or straining until you are off narcotics for pain control.  You will have off days, but things should improve week-by-week. DO NOT PUSH THROUGH PAIN.  Let pain be your guide: If it hurts to do something, don't do it.   Lifting restrictions   Complete by: As directed    If you can walk 30 minutes without difficulty, it is safe to try more intense activity such as jogging, treadmill, bicycling, low-impact aerobics, swimming, etc. Save the most intensive and strenuous activity for last (Usually 4-8 weeks after surgery) such as sit-ups, heavy lifting, contact sports, etc.   Refrain from any intense heavy lifting or straining until you are off narcotics for pain control.  You will have off days, but things should improve week-by-week. DO NOT PUSH THROUGH PAIN.  Let pain be your guide: If it hurts to do  something, don't do it.  Pain is your body warning you to avoid that activity for another week until the pain goes down.   May shower / Bathe   Complete by: As directed    May walk up steps   Complete by: As directed    Remove dressing in 48 hours   Complete by: As directed    Make sure all dressings have been removed on the second day after surgery = 3/14 Friday Leave incisions open to air.  OK to cover incisions with gauze or bandages as desired   Sexual Activity Restrictions   Complete by: As directed    You may have sexual intercourse when it is comfortable. If it hurts to do something, stop.       Allergies as of 06/27/2023   No Known Allergies      Medication List     TAKE these medications    diclofenac 75 MG EC tablet Commonly known as: VOLTAREN Take 1 tablet (75 mg) by mouth 2 times daily as needed. What changed: Another medication with the same name was removed. Continue taking this medication, and follow the directions you see here.   escitalopram 10 MG tablet Commonly known as: LEXAPRO Take 1 tablet (10 mg) by mouth daily. What changed:  when to take this reasons to take this   hydrochlorothiazide 25 MG tablet Commonly known as: HYDRODIURIL Take 1 tablet (25 mg total) by mouth every morning.   losartan 50 MG tablet Commonly known as: COZAAR Take 1 tablet (50 mg total) by mouth daily.   Melatonin 10 MG Caps Take 10 mg by mouth at bedtime.   methocarbamol 500 MG tablet Commonly known as: ROBAXIN Take 1 tablet (500 mg total) by mouth every 8 (eight) hours as needed for muscle spasms.   ondansetron 4 MG disintegrating tablet Commonly known as: ZOFRAN-ODT Dissolve 1 tablet (4 mg total) by mouth every 8 (eight) hours as needed for nausea for up to 7 days.   tamsulosin 0.4 MG Caps capsule Commonly known as: FLOMAX Take 1 capsule (0.4 mg total) by mouth daily.   traMADol 50 MG tablet Commonly known as: ULTRAM Take 1-2 tablets (50-100 mg total) by  mouth every 6 (six) hours as needed for moderate pain (pain score 4-6) or severe pain (pain score 7-10).   Vista [place]onio Dry Eye Relief 2.5 % Soln Generic drug: Hypromellose Place 1 drop into both eyes 4 (  four) times daily as needed for dry eyes               Discharge Care Instructions  (From admission, onward)           Start     Ordered   06/25/23 0000  Discharge wound care:       Comments: It is good for closed incisions and even open wounds to be washed every day.  Shower every day.  Short baths are fine.  Wash the incisions and wounds clean with soap & water.    You may leave closed incisions open to air if it is dry.   You may cover the incision with clean gauze & replace it after your daily shower for comfort.  TEGADERM:  You have clear gauze band-aid dressings over your closed incision(s).  Remove the dressings 3/14, two days after surgery.   06/25/23 1154            Significant Diagnostic Studies:  Results for orders placed or performed during the hospital encounter of 06/25/23 (from the past 72 hours)  Basic metabolic panel     Status: Abnormal   Collection Time: 06/26/23  5:19 AM  Result Value Ref Range   Sodium 131 (L) 135 - 145 mmol/L   Potassium 3.7 3.5 - 5.1 mmol/L   Chloride 97 (L) 98 - 111 mmol/L   CO2 22 22 - 32 mmol/L   Glucose, Bld 132 (H) 70 - 99 mg/dL    Comment: Glucose reference range applies only to samples taken after fasting for at least 8 hours.   BUN 14 6 - 20 mg/dL   Creatinine, Ser 1.61 0.61 - 1.24 mg/dL   Calcium 8.6 (L) 8.9 - 10.3 mg/dL   GFR, Estimated >09 >60 mL/min    Comment: (NOTE) Calculated using the CKD-EPI Creatinine Equation (2021)    Anion gap 12 5 - 15    Comment: Performed at [facility], [address], [place], [place] [address]  CBC     Status: Abnormal   Collection Time: 06/26/23  5:19 AM  Result Value Ref Range   WBC 11.8 (H) 4.0 - 10.5 K/uL   RBC 3.43 (L) 4.22 - 5.81 MIL/uL   Hemoglobin  10.8 (L) 13.0 - 17.0 g/dL   HCT 81.1 (L) 91.4 - 78.2 %   MCV 96.8 80.0 - 100.0 fL   MCH 31.5 26.0 - 34.0 pg   MCHC 32.5 30.0 - 36.0 g/dL   RDW 95.6 21.3 - 08.6 %   Platelets 380 150 - 400 K/uL   nRBC 0.0 0.0 - 0.2 %    Comment: Performed at [facility], [address] [place], [place] [address]  Magnesium     Status: None   Collection Time: 06/26/23  5:19 AM  Result Value Ref Range   Magnesium 1.8 1.7 - 2.4 mg/dL    Comment: Performed at [facility], [address], [place], [place] [address]    DG C-Arm 1-60 Min-No Report Result Date: 06/25/2023 Fluoroscopy was utilized by the requesting physician.  No radiographic interpretation.    Past Medical History:  Diagnosis Date   HTN (hypertension)     Past Surgical History:  Procedure Laterality Date   FLEXIBLE SIGMOIDOSCOPY N/A 06/25/2023   Procedure: Arnell Sieving;  Surgeon: Karie Soda, MD;  Location: [facility];  Service: General;  Laterality: N/A;    Social History   Socioeconomic History   Marital status: Married  Spouse name: Not on file   Number of children: Not on file   Years of education: Not on file   Highest education level: Not on file  Occupational History   Not on file  Tobacco Use   Smoking status: Never   Smokeless tobacco: Never  Vaping Use   Vaping status: Never Used  Substance and Sexual Activity   Alcohol use: Yes    Comment: occasionally   Drug use: Never   Sexual activity: Not on file  Other Topics Concern   Not on file  Social History Narrative   Family originally from [place]   Social Drivers of Health   Financial Resource Strain: Low Risk  (12/06/2022)   Overall Financial Resource Strain (CARDIA)    Difficulty of Paying Living Expenses: Not very hard  Food Insecurity: No Food Insecurity (06/25/2023)   Hunger Vital Sign    Worried About Running Out of Food in the Last Year: Never true    Ran Out of Food in the Last Year: Never true   Transportation Needs: No Transportation Needs (06/25/2023)   PRAPARE - Administrator, Civil Service (Medical): No    Lack of Transportation (Non-Medical): No  Physical Activity: Sufficiently Active (12/06/2022)   Exercise Vital Sign    Days of Exercise per Week: 3 days    Minutes of Exercise per Session: 60 min  Stress: Stress Concern Present (12/06/2022)   [employer] of Occupational Health - Occupational Stress Questionnaire    Feeling of Stress : To some extent  Social Connections: Unknown (12/06/2022)   Social Connection and Isolation Panel [NHANES]    Frequency of Communication with Friends and Family: More than three times a week    Frequency of Social Gatherings with Friends and Family: Patient declined    Attends Religious Services: Patient declined    Database administrator or Organizations: No    Attends Engineer, structural: Not on file    Marital Status: Married  Catering manager Violence: Not At Risk (06/25/2023)   Humiliation, Afraid, Rape, and Kick questionnaire    Fear of Current or Ex-Partner: No    Emotionally Abused: No    Physically Abused: No    Sexually Abused: No    Family History  Problem Relation Age of Onset   Colon cancer Neg Hx    Colon polyps Neg Hx    Esophageal cancer Neg Hx    Rectal cancer Neg Hx    Stomach cancer Neg Hx     Current Facility-Administered Medications  Medication Dose Route Frequency Provider Last Rate Last Admin   0.9 %  sodium chloride infusion  250 mL Intravenous PRN Karie Soda, MD       acetaminophen (TYLENOL) tablet 1,000 mg  1,000 mg Oral Q6H Karie Soda, MD   1,000 mg at 06/27/23 0054   alum & mag hydroxide-simeth (MAALOX/MYLANTA) 200-200-20 MG/5ML suspension 30 mL  30 mL Oral Q6H PRN Karie Soda, MD   30 mL at 06/26/23 0012   alvimopan (ENTEREG) capsule 12 mg  12 mg Oral BID Karie Soda, MD       diphenhydrAMINE (BENADRYL) 12.5 MG/5ML elixir 12.5 mg  12.5 mg Oral Q6H PRN Karie Soda,  MD       Or   diphenhydrAMINE (BENADRYL) injection 12.5 mg  12.5 mg Intravenous Q6H PRN Karie Soda, MD       enoxaparin (LOVENOX) injection 40 mg  40 mg Subcutaneous Q24H Karie Soda, MD   40  mg at 06/26/23 1524   escitalopram (LEXAPRO) tablet 10 mg  10 mg Oral Daily Karie Soda, MD   10 mg at 06/26/23 1610   feeding supplement (ENSURE SURGERY) liquid 237 mL  237 mL Oral BID BM Karie Soda, MD   237 mL at 06/26/23 1336   gabapentin (NEURONTIN) capsule 200 mg  200 mg Oral Laurena Slimmer, MD   200 mg at 06/26/23 2211   hydrALAZINE (APRESOLINE) injection 10 mg  10 mg Intravenous Q2H PRN Karie Soda, MD       HYDROmorphone (DILAUDID) injection 0.5-2 mg  0.5-2 mg Intravenous Q4H PRN Karie Soda, MD       lactated ringers infusion   Intravenous Q8H PRN Karie Soda, MD       losartan (COZAAR) tablet 50 mg  50 mg Oral Daily Karie Soda, MD   50 mg at 06/26/23 9604   magic mouthwash  15 mL Oral QID PRN Karie Soda, MD       melatonin tablet 5 mg  5 mg Oral QHS PRN Karie Soda, MD   5 mg at 06/27/23 0054   menthol-cetylpyridinium (CEPACOL) lozenge 3 mg  1 lozenge Oral PRN Karie Soda, MD       methocarbamol (ROBAXIN) injection 1,000 mg  1,000 mg Intravenous Q6H PRN Karie Soda, MD       methocarbamol (ROBAXIN) tablet 1,000 mg  1,000 mg Oral Q6H PRN Karie Soda, MD   1,000 mg at 06/27/23 0054   metoprolol tartrate (LOPRESSOR) injection 5 mg  5 mg Intravenous Q6H PRN Karie Soda, MD       ondansetron [facility]) tablet 4 mg  4 mg Oral Q6H PRN Karie Soda, MD       Or   ondansetron [facility]) injection 4 mg  4 mg Intravenous Q6H PRN Karie Soda, MD       phenol (CHLORASEPTIC) mouth spray 2 spray  2 spray Mouth/Throat PRN Karie Soda, MD       polycarbophil (FIBERCON) tablet 625 mg  625 mg Oral BID Karie Soda, MD   625 mg at 06/26/23 2211   prochlorperazine (COMPAZINE) tablet 10 mg  10 mg Oral Q6H PRN Karie Soda, MD       Or   prochlorperazine (COMPAZINE) injection  5-10 mg  5-10 mg Intravenous Q6H PRN Karie Soda, MD       simethicone (MYLICON) chewable tablet 40 mg  40 mg Oral Q6H PRN Karie Soda, MD       sodium chloride (OCEAN) 0.65 % nasal spray 1-2 spray  1-2 spray Each Nare Q6H PRN Karie Soda, MD       sodium chloride flush (NS) 0.9 % injection 3 mL  3 mL Intravenous Catha Gosselin, MD   3 mL at 06/26/23 2222   sodium chloride flush (NS) 0.9 % injection 3 mL  3 mL Intravenous PRN Karie Soda, MD       tamsulosin [facility]) capsule 0.4 mg  0.4 mg Oral Daily Karie Soda, MD   0.4 mg at 06/26/23 5409   traMADol Janean Sark) tablet 50-100 mg  50-100 mg Oral Q6H PRN Karie Soda, MD   100 mg at 06/27/23 0053     No Known Allergies  Signed:   Ardeth Sportsman, MD, FACS, MASCRS Esophageal, Gastrointestinal & Colorectal Surgery Robotic and Minimally Invasive Surgery  [facility] [address] [place], [place] [address] [phone] Fax [phone] Main  CONTACT INFORMATION: Weekday (9AM-5PM): Call  [facility] main office at [phone] Weeknight (5PM-9AM) or Weekend/Holiday: Check EPIC "Web Links" tab & use "AMION" (password " [email or web address]") for General Surgery [facility] coverage  Please, DO NOT use SecureChat  (it is not reliable communication to reach operating surgeons & will lead to a delay in care).   Epic staff messaging available for outptient concerns needing 1-2 business day response.      06/27/2023, 8:10 AM

## 2023-06-27 NOTE — Progress Notes (Signed)
 AVS reviewed w/ pt who verbalized an understanding- no other questions at this time. Pt dressed for d/c - PIV removed by primary nurse. Sister enroute to pick up pt from the hospital

## 2023-06-27 NOTE — Plan of Care (Signed)
  Problem: Education: Goal: Understanding of discharge needs will improve Outcome: Completed/Met Goal: Verbalization of understanding of the causes of altered bowel function will improve Outcome: Completed/Met   Problem: Activity: Goal: Ability to tolerate increased activity will improve Outcome: Completed/Met   Problem: Bowel/Gastric: Goal: Gastrointestinal status for postoperative course will improve Outcome: Completed/Met   Problem: Health Behavior/Discharge Planning: Goal: Identification of community resources to assist with postoperative recovery needs will improve Outcome: Completed/Met   Problem: Nutritional: Goal: Will attain and maintain optimal nutritional status will improve Outcome: Completed/Met   Problem: Clinical Measurements: Goal: Postoperative complications will be avoided or minimized Outcome: Completed/Met   Problem: Respiratory: Goal: Respiratory status will improve Outcome: Completed/Met   Problem: Skin Integrity: Goal: Will show signs of wound healing Outcome: Completed/Met   Problem: Education: Goal: Knowledge of General Education information will improve Description: Including pain rating scale, medication(s)/side effects and non-pharmacologic comfort measures Outcome: Completed/Met   Problem: Health Behavior/Discharge Planning: Goal: Ability to manage health-related needs will improve Outcome: Completed/Met   Problem: Clinical Measurements: Goal: Ability to maintain clinical measurements within normal limits will improve Outcome: Completed/Met Goal: Will remain free from infection Outcome: Completed/Met Goal: Diagnostic test results will improve Outcome: Completed/Met Goal: Respiratory complications will improve Outcome: Completed/Met Goal: Cardiovascular complication will be avoided Outcome: Completed/Met   Problem: Activity: Goal: Risk for activity intolerance will decrease Outcome: Completed/Met   Problem: Nutrition: Goal:  Adequate nutrition will be maintained Outcome: Completed/Met   Problem: Coping: Goal: Level of anxiety will decrease Outcome: Completed/Met   Problem: Elimination: Goal: Will not experience complications related to bowel motility Outcome: Completed/Met Goal: Will not experience complications related to urinary retention Outcome: Completed/Met   Problem: Pain Managment: Goal: General experience of comfort will improve and/or be controlled Outcome: Completed/Met   Problem: Safety: Goal: Ability to remain free from injury will improve Outcome: Completed/Met   Problem: Skin Integrity: Goal: Risk for impaired skin integrity will decrease Outcome: Completed/Met

## 2023-06-30 ENCOUNTER — Other Ambulatory Visit (HOSPITAL_COMMUNITY): Payer: Self-pay

## 2023-06-30 ENCOUNTER — Other Ambulatory Visit: Payer: Self-pay

## 2023-07-01 ENCOUNTER — Other Ambulatory Visit (HOSPITAL_COMMUNITY): Payer: Self-pay

## 2023-07-01 ENCOUNTER — Other Ambulatory Visit: Payer: Self-pay | Admitting: Urology

## 2023-07-01 ENCOUNTER — Telehealth: Payer: Self-pay | Admitting: Urology

## 2023-07-01 ENCOUNTER — Other Ambulatory Visit: Payer: Self-pay

## 2023-07-01 ENCOUNTER — Telehealth: Payer: Self-pay | Admitting: Gastroenterology

## 2023-07-01 DIAGNOSIS — R972 Elevated prostate specific antigen [PSA]: Secondary | ICD-10-CM

## 2023-07-01 MED ORDER — TRAMADOL HCL 50 MG PO TABS
50.0000 mg | ORAL_TABLET | Freq: Four times a day (QID) | ORAL | 0 refills | Status: DC | PRN
  Filled 2023-07-01: qty 20, 5d supply, fill #0

## 2023-07-01 NOTE — Telephone Encounter (Signed)
 Pt requested a appointment and asked this in the notes: Now That My Surgery Is Complete Do We Need To Get Back On Track With Prostate Exams/Procedures?  Please advise.

## 2023-07-01 NOTE — Telephone Encounter (Signed)
 Returned call to the patient and explained to the patient this was a routine follow up that was scheduled for 07-15-23 with Dr Barron Alvine.  The main reason to follow up was to make sure patient was evaluated and treated at [facility] for colovesical fistula.  Patient had surgery on 06-25-23.  He received phone call today from [facility] that pathology was benign.  Patient prefers to cancel appointment scheduled with Dr Barron Alvine.  Appointment canceled and patient advised to contact our office with any future questions or concerns.

## 2023-07-01 NOTE — Telephone Encounter (Signed)
 Inbound call from patient requesting a call to discuss 4/1 appointment further. States he does not understand what appointment is for. Advised patient it looks like a follow up for fistula. Patient is requesting to know if it is necessary to keep appointment. Please advise, thank you.

## 2023-07-01 NOTE — Telephone Encounter (Signed)
 Spoke with patient who is aware of plan to get labs and follow up with Dr. Margo Aye. Appts made for lab and to see dr.

## 2023-07-11 ENCOUNTER — Other Ambulatory Visit (HOSPITAL_BASED_OUTPATIENT_CLINIC_OR_DEPARTMENT_OTHER): Payer: Self-pay

## 2023-07-11 ENCOUNTER — Other Ambulatory Visit

## 2023-07-11 DIAGNOSIS — R972 Elevated prostate specific antigen [PSA]: Secondary | ICD-10-CM

## 2023-07-12 LAB — PSA, TOTAL AND FREE
PSA, Free Pct: 17.9 %
PSA, Free: 1.02 ng/mL
Prostate Specific Ag, Serum: 5.7 ng/mL — ABNORMAL HIGH (ref 0.0–4.0)

## 2023-07-15 ENCOUNTER — Ambulatory Visit: Payer: Commercial Managed Care - PPO | Admitting: Gastroenterology

## 2023-07-17 ENCOUNTER — Ambulatory Visit: Admitting: Urology

## 2023-07-17 ENCOUNTER — Encounter: Payer: Self-pay | Admitting: Urology

## 2023-07-17 VITALS — BP 121/75 | HR 65

## 2023-07-17 DIAGNOSIS — N401 Enlarged prostate with lower urinary tract symptoms: Secondary | ICD-10-CM

## 2023-07-17 DIAGNOSIS — R972 Elevated prostate specific antigen [PSA]: Secondary | ICD-10-CM

## 2023-07-17 LAB — MICROSCOPIC EXAMINATION

## 2023-07-17 LAB — URINALYSIS, ROUTINE W REFLEX MICROSCOPIC
Bilirubin, UA: NEGATIVE
Glucose, UA: NEGATIVE
Ketones, UA: NEGATIVE
Nitrite, UA: NEGATIVE
Protein,UA: NEGATIVE
Specific Gravity, UA: 1.015 (ref 1.005–1.030)
Urobilinogen, Ur: 0.2 mg/dL (ref 0.2–1.0)
pH, UA: 6.5 (ref 5.0–7.5)

## 2023-07-17 NOTE — Progress Notes (Signed)
   Assessment: 1. Elevated PSA   2. Benign prostatic hyperplasia with lower urinary tract symptoms, symptom details unspecified     Plan: Today I had a detailed discussion with the patient regarding his elevated PSA along with the issues and controversies regarding prostate cancer early detection.  I recommended further evaluation with prostate imaging via multiparametric prostate MRI.  Patient will follow-up after the MRI and at that time we will see Dr. Pete Glatter with my retirement.  Chief Complaint: BPH/elevated psa  HPI: Perry Zamora is a 63 y.o. male who presents for continued evaluation of a number of urologic issues.  Please see my note 03/19/2023 to time of initial visit for detailed history and exam.  He initially presented with a history of elevated PSA with lower urinary tract symptoms improved on tamsulosin and microscopic hematuria.  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  Patient reports significant improvement in his lower urinary tract symptoms following the colovesical fistula repair.  Current IPSS = 11.  No other prior GU history. Patient was started on tamsulosin 0.4 mg daily by his PCP 11/2022.  He reports that this has helped significantly.    PSA data: 05/2022             10.52 11/2022             4.88 03/2023           11.74   iso psa 8.7.    06/2023  5.7 (18% free)   There is no known family history of prostate cancer.   DRE today reveals a large 50 to 60 g gland without nodules or induration   Portions of the above documentation were copied from a prior visit for review purposes only.  Allergies: No Known Allergies  PMH: Past Medical History:  Diagnosis Date   HTN (hypertension)     PSH: Past Surgical History:  Procedure Laterality Date   FLEXIBLE SIGMOIDOSCOPY N/A 06/25/2023   Procedure: SIGMOIDOSCOPY, FLEXIBLE;  Surgeon: Karie Soda, MD;   Location: [facility];  Service: General;  Laterality: N/A;    SH: Social History   Tobacco Use   Smoking status: Never   Smokeless tobacco: Never  Vaping Use   Vaping status: Never Used  Substance Use Topics   Alcohol use: Yes    Comment: occasionally   Drug use: Never    ROS: Constitutional:  Negative for fever, chills, weight loss CV: Negative for chest pain, previous MI, hypertension Respiratory:  Negative for shortness of breath, wheezing, sleep apnea, frequent cough GI:  Negative for nausea, vomiting, bloody stool, GERD  PE: BP 121/75   Pulse 65  GENERAL APPEARANCE:  Well appearing, well developed, well nourished, NAD

## 2023-07-23 ENCOUNTER — Encounter: Payer: Self-pay | Admitting: Urology

## 2023-07-23 ENCOUNTER — Ambulatory Visit
Admission: RE | Admit: 2023-07-23 | Discharge: 2023-07-23 | Discharge status: Home / Self Care | Source: Ambulatory Visit | Attending: Urology | Admitting: Urology

## 2023-07-23 DIAGNOSIS — R972 Elevated prostate specific antigen [PSA]: Secondary | ICD-10-CM | POA: Diagnosis not present

## 2023-07-23 MED ORDER — GADOPICLENOL 0.5 MMOL/ML IV SOLN
7.0000 mL | Freq: Once | INTRAVENOUS | Status: AC | PRN
  Administered 2023-07-23: 7 mL via INTRAVENOUS

## 2023-07-29 ENCOUNTER — Other Ambulatory Visit: Payer: Self-pay | Admitting: Emergency Medicine

## 2023-07-29 ENCOUNTER — Encounter: Payer: Self-pay | Admitting: Emergency Medicine

## 2023-07-29 DIAGNOSIS — M87059 Idiopathic aseptic necrosis of unspecified femur: Secondary | ICD-10-CM

## 2023-07-30 ENCOUNTER — Other Ambulatory Visit: Payer: Self-pay

## 2023-07-30 ENCOUNTER — Encounter (HOSPITAL_COMMUNITY): Payer: Self-pay

## 2023-07-30 ENCOUNTER — Encounter: Payer: Self-pay | Admitting: Urology

## 2023-07-30 ENCOUNTER — Other Ambulatory Visit (HOSPITAL_COMMUNITY): Payer: Self-pay

## 2023-07-30 ENCOUNTER — Other Ambulatory Visit: Payer: Self-pay | Admitting: Emergency Medicine

## 2023-07-30 MED ORDER — DICLOFENAC SODIUM 75 MG PO TBEC
75.0000 mg | DELAYED_RELEASE_TABLET | Freq: Two times a day (BID) | ORAL | 1 refills | Status: DC | PRN
  Filled 2023-07-30: qty 30, 15d supply, fill #0
  Filled 2023-08-18: qty 30, 15d supply, fill #1

## 2023-08-02 ENCOUNTER — Other Ambulatory Visit (HOSPITAL_COMMUNITY): Payer: Self-pay

## 2023-08-11 ENCOUNTER — Other Ambulatory Visit (HOSPITAL_COMMUNITY): Payer: Self-pay

## 2023-08-12 ENCOUNTER — Telehealth: Payer: Self-pay | Admitting: Urology

## 2023-08-12 NOTE — Telephone Encounter (Signed)
 called and lvm to r/s pt apt due to provider being out of office on 08/28/23 ANN 08/12/23

## 2023-08-13 ENCOUNTER — Ambulatory Visit: Admitting: Urology

## 2023-08-18 ENCOUNTER — Other Ambulatory Visit: Payer: Self-pay

## 2023-08-18 ENCOUNTER — Ambulatory Visit: Admitting: Urology

## 2023-08-18 ENCOUNTER — Other Ambulatory Visit (HOSPITAL_COMMUNITY): Payer: Self-pay

## 2023-08-25 ENCOUNTER — Other Ambulatory Visit (HOSPITAL_COMMUNITY): Payer: Self-pay

## 2023-08-27 ENCOUNTER — Ambulatory Visit: Admitting: Urology

## 2023-09-01 ENCOUNTER — Other Ambulatory Visit (HOSPITAL_COMMUNITY): Payer: Self-pay

## 2023-09-01 ENCOUNTER — Encounter: Payer: Self-pay | Admitting: Orthopaedic Surgery

## 2023-09-01 ENCOUNTER — Other Ambulatory Visit (INDEPENDENT_AMBULATORY_CARE_PROVIDER_SITE_OTHER)

## 2023-09-01 ENCOUNTER — Ambulatory Visit: Admitting: Orthopaedic Surgery

## 2023-09-01 DIAGNOSIS — M25551 Pain in right hip: Secondary | ICD-10-CM | POA: Diagnosis not present

## 2023-09-01 MED ORDER — TIZANIDINE HCL 4 MG PO TABS
4.0000 mg | ORAL_TABLET | Freq: Three times a day (TID) | ORAL | 1 refills | Status: DC | PRN
  Filled 2023-09-01 (×2): qty 30, 10d supply, fill #0
  Filled 2023-09-25: qty 30, 10d supply, fill #1

## 2023-09-01 MED ORDER — CELECOXIB 200 MG PO CAPS
200.0000 mg | ORAL_CAPSULE | Freq: Two times a day (BID) | ORAL | 1 refills | Status: DC | PRN
  Filled 2023-09-01 (×2): qty 60, 30d supply, fill #0
  Filled 2023-09-25: qty 60, 30d supply, fill #1

## 2023-09-01 NOTE — Progress Notes (Signed)
 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  He denies any groin pain at all.  He points only to the lower aspect of his lumbar spine to the right side at the top of his pelvis as a source of his pain.  He does not walk with a limp.  He has had a previous epidural steroid injection by Dr. Daisey Dryer to the right side but not a facet joint injection.  He did see Dr. Daisey Dryer as well.  He has no radicular component of his pain.  Both hips move smoothly and fluidly with no blocks or rotation and no pain in the groin at all.  Standing AP pelvis and lateral both hips shows normal-appearing hips bilaterally.  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  The MRI of his prostate also points to this being a chronic process.  It does not seem that his hips are an issue from my standpoint.  We do need to follow these along closely since there is a report of AVN.  I would like to send her to Dr. Daisey Dryer but this time for a right sided L5-S1 facet joint injection the correlates with where the patient seems to be having most of his symptoms.  He has been on diclofenac  so we will switch him to Celebrex  and try Zanaflex  as well.  Once he sees Dr. Daisey Dryer for that injection he can send him to Dr. Sulema Endo for his back if there is issues in the patient is to follow-up with me if there are issues with his hips.

## 2023-09-02 ENCOUNTER — Other Ambulatory Visit: Payer: Self-pay

## 2023-09-02 DIAGNOSIS — M5416 Radiculopathy, lumbar region: Secondary | ICD-10-CM

## 2023-09-02 DIAGNOSIS — G8929 Other chronic pain: Secondary | ICD-10-CM

## 2023-09-15 ENCOUNTER — Ambulatory Visit
Admission: EM | Admit: 2023-09-15 | Discharge: 2023-09-15 | Disposition: A | Discharge status: Home / Self Care | Attending: Physician Assistant | Admitting: Physician Assistant

## 2023-09-15 ENCOUNTER — Other Ambulatory Visit (HOSPITAL_COMMUNITY): Payer: Self-pay

## 2023-09-15 ENCOUNTER — Encounter: Payer: Self-pay | Admitting: Emergency Medicine

## 2023-09-15 DIAGNOSIS — W57XXXA Bitten or stung by nonvenomous insect and other nonvenomous arthropods, initial encounter: Secondary | ICD-10-CM | POA: Diagnosis not present

## 2023-09-15 DIAGNOSIS — S30862A Insect bite (nonvenomous) of penis, initial encounter: Secondary | ICD-10-CM

## 2023-09-15 MED ORDER — DOXYCYCLINE HYCLATE 100 MG PO CAPS
100.0000 mg | ORAL_CAPSULE | Freq: Two times a day (BID) | ORAL | 0 refills | Status: AC
Start: 2023-09-15 — End: 2023-09-22
  Filled 2023-09-15 (×2): qty 14, 7d supply, fill #0

## 2023-09-15 NOTE — ED Provider Notes (Signed)
 [facility]    CSN: [id number] Arrival date & time: 09/15/23  1007      History   Chief Complaint Chief Complaint  Patient presents with   Tick Removal   Fatigue    HPI ZALMAN HULL is a 63 y.o. male.   Patient presents today for evaluation of generalized fatigue, loss of appetite, nausea and vomiting that he has had for 3 days after he removed a tick a week ago.  He reports that the tick was present to his penis but he has not had any redness or rash to that area.  He states the area of the bite looks normal at this time.  The history is provided by the patient.    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   History of colonic diverticulitis 05/26/2023   Recurrent UTI (urinary tract infection) 05/26/2023   Lower urinary tract symptoms 12/09/2022   Hepatic steatosis 09/25/2022   Transaminitis 09/25/2022   Chronic right-sided low back pain with right-sided sciatica 09/24/2022   Essential hypertension 09/24/2022   Pulmonary nodule 03/25/2020   Elevated PSA 03/24/2020    Past Surgical History:  Procedure Laterality Date   FLEXIBLE SIGMOIDOSCOPY N/A 06/25/2023   Procedure: Marlynn Singer;  Surgeon: Candyce Champagne, MD;  Location: [facility];  Service: General;  Laterality: N/A;       Home Medications    Prior to Admission medications   Medication Sig Start Date End Date Taking? Authorizing Provider  doxycycline (VIBRAMYCIN) 100 MG capsule Take 1 capsule (100 mg total) by mouth 2 (two) times daily for 7 days. 09/15/23 09/22/23 Yes Vernestine Gondola, PA-C  hydrochlorothiazide  (HYDRODIURIL ) 25 MG tablet Take 1 tablet (25 mg total) by mouth every morning. 10/28/22  Yes Sagardia, Isidro Margo, MD  losartan  (COZAAR ) 50 MG tablet Take 1 tablet (50 mg total) by mouth daily. 04/04/23  Yes Sagardia, Isidro Margo, MD  tamsulosin  (FLOMAX )  0.4 MG CAPS capsule Take 1 capsule (0.4 mg total) by mouth daily. 04/03/23  Yes Sagardia, Isidro Margo, MD  celecoxib  (CELEBREX ) 200 MG capsule Take 1 capsule (200 mg total) by mouth 2 (two) times daily between meals as needed. Patient not taking: Reported on 09/15/2023 09/01/23   Arnie Lao, MD  diclofenac  (VOLTAREN ) 75 MG EC tablet Take 1 tablet (75 mg) by mouth 2 times daily as needed. Patient not taking: Reported on 09/15/2023 07/30/23   Sagardia, Miguel Jose, MD  escitalopram  (LEXAPRO ) 10 MG tablet Take 1 tablet (10 mg) by mouth daily. Patient not taking: Reported on 09/15/2023 10/28/22   Elvira Hammersmith, MD  tiZANidine  (ZANAFLEX ) 4 MG tablet Take 1 tablet (4 mg total) by mouth every 8 (eight) hours as needed for muscle spasms. Patient not taking: Reported on 09/15/2023 09/01/23   Arnie Lao, MD    Family History Family History  Problem Relation Age of Onset   Colon cancer Neg Hx    Colon polyps Neg Hx    Esophageal cancer Neg Hx    Rectal cancer Neg Hx    Stomach cancer Neg Hx     Social History Social History   Tobacco Use   Smoking status: Never   Smokeless tobacco: Never  Vaping Use   Vaping status: Never Used  Substance Use Topics   Alcohol use: Yes    Comment: occasionally  Drug use: Never     Allergies   Patient has no known allergies.   Review of Systems Review of Systems  Constitutional:  Positive for appetite change and fatigue. Negative for chills and fever.  Eyes:  Negative for discharge and redness.  Respiratory:  Negative for shortness of breath.   Gastrointestinal:  Positive for nausea and vomiting.  Skin:  Negative for color change and wound.  Neurological:  Negative for numbness.     Physical Exam Triage Vital Signs ED Triage Vitals [09/15/23 1032]  Encounter Vitals Group     BP 119/82     Systolic BP Percentile      Diastolic BP Percentile      Pulse Rate (!) 103     Resp 16     Temp 97.8 F (36.6 C)     Temp  Source Oral     SpO2 96 %     Weight      Height      Head Circumference      Peak Flow      Pain Score 0     Pain Loc      Pain Education      Exclude from Growth Chart    No data found.  Updated Vital Signs BP 119/82 (BP Location: Left Arm)   Pulse (!) 103   Temp 97.8 F (36.6 C) (Oral)   Resp 16   SpO2 96%   Visual Acuity Right Eye Distance:   Left Eye Distance:   Bilateral Distance:    Right Eye Near:   Left Eye Near:    Bilateral Near:     Physical Exam Vitals and nursing note reviewed.  Constitutional:      General: He is not in acute distress.    Appearance: Normal appearance. He is not ill-appearing.  HENT:     Head: Normocephalic and atraumatic.  Eyes:     Conjunctiva/sclera: Conjunctivae normal.  Cardiovascular:     Rate and Rhythm: Normal rate and regular rhythm.  Pulmonary:     Effort: Pulmonary effort is normal. No respiratory distress.     Breath sounds: No wheezing, rhonchi or rales.  Neurological:     Mental Status: He is alert.  Psychiatric:        Mood and Affect: Mood normal.        Behavior: Behavior normal.        Thought Content: Thought content normal.      UC Treatments / Results  Labs (all labs ordered are listed, but only abnormal results are displayed) Labs Reviewed  CBC WITH DIFFERENTIAL/PLATELET  COMPREHENSIVE METABOLIC PANEL WITH GFR  LYME DISEASE SEROLOGY W/REFLEX    EKG   Radiology No results found.  Procedures Procedures (including critical care time)  Medications Ordered in UC Medications - No data to display  Initial Impression / Assessment and Plan / UC Course  I have reviewed the triage vital signs and the nursing notes.  Pertinent labs & imaging results that were available during my care of the patient were reviewed by me and considered in my medical decision making (see chart for details).    Given known tick bite we will treat with doxycycline.  Lyme disease labs ordered at patient's request as  well as CBC and CMP.  Recommended follow-up if no gradual improvement or with any worsening or new symptoms.  Patient expressed understanding.  Final Clinical Impressions(s) / UC Diagnoses   Final diagnoses:  Tick bite of penis, initial  encounter   Discharge Instructions   None    ED Prescriptions     Medication Sig Dispense Auth. Provider   doxycycline (VIBRAMYCIN) 100 MG capsule Take 1 capsule (100 mg total) by mouth 2 (two) times daily for 7 days. 14 capsule Vernestine Gondola, PA-C      PDMP not reviewed this encounter.   Vernestine Gondola, PA-C 09/15/23 [phone]

## 2023-09-15 NOTE — ED Triage Notes (Signed)
 Pt reports general fatigue, loss of appetite, and N/V x3 days after removing a tick x1 week ago. Pt reports tick was pulled off his penis and has kept the area clean. He notes that area looks normal with no rash, but he is having all these other issues.

## 2023-09-16 ENCOUNTER — Ambulatory Visit

## 2023-09-16 ENCOUNTER — Ambulatory Visit (HOSPITAL_COMMUNITY): Payer: Self-pay

## 2023-09-16 LAB — CBC WITH DIFFERENTIAL/PLATELET
Basophils Absolute: 0 10*3/uL (ref 0.0–0.2)
Basos: 1 %
EOS (ABSOLUTE): 0 10*3/uL (ref 0.0–0.4)
Eos: 0 %
Hematocrit: 45.2 % (ref 37.5–51.0)
Hemoglobin: 14.7 g/dL (ref 13.0–17.7)
Immature Grans (Abs): 0 10*3/uL (ref 0.0–0.1)
Immature Granulocytes: 0 %
Lymphocytes Absolute: 1.4 10*3/uL (ref 0.7–3.1)
Lymphs: 34 %
MCH: 31.3 pg (ref 26.6–33.0)
MCHC: 32.5 g/dL (ref 31.5–35.7)
MCV: 96 fL (ref 79–97)
Monocytes Absolute: 0.3 10*3/uL (ref 0.1–0.9)
Monocytes: 8 %
Neutrophils Absolute: 2.3 10*3/uL (ref 1.4–7.0)
Neutrophils: 57 %
Platelets: 306 10*3/uL (ref 150–450)
RBC: 4.7 x10E6/uL (ref 4.14–5.80)
RDW: 14.1 % (ref 11.6–15.4)
WBC: 4.1 10*3/uL (ref 3.4–10.8)

## 2023-09-16 LAB — COMPREHENSIVE METABOLIC PANEL WITH GFR
ALT: 857 IU/L (ref 0–44)
AST: 726 IU/L (ref 0–40)
Albumin: 4.6 g/dL (ref 3.8–4.9)
Alkaline Phosphatase: 150 IU/L — ABNORMAL HIGH (ref 44–121)
BUN/Creatinine Ratio: 14 (ref 10–24)
BUN: 14 mg/dL (ref 8–27)
Bilirubin Total: 0.5 mg/dL (ref 0.0–1.2)
CO2: 23 mmol/L (ref 20–29)
Calcium: 9.9 mg/dL (ref 8.6–10.2)
Chloride: 91 mmol/L — ABNORMAL LOW (ref 96–106)
Creatinine, Ser: 0.97 mg/dL (ref 0.76–1.27)
Globulin, Total: 3.1 g/dL (ref 1.5–4.5)
Glucose: 126 mg/dL — ABNORMAL HIGH (ref 70–99)
Potassium: 4.6 mmol/L (ref 3.5–5.2)
Sodium: 135 mmol/L (ref 134–144)
Total Protein: 7.7 g/dL (ref 6.0–8.5)
eGFR: 89 mL/min/{1.73_m2} (ref >59)

## 2023-09-16 LAB — LYME DISEASE SEROLOGY W/REFLEX: Lyme Total Antibody EIA: NEGATIVE

## 2023-09-16 NOTE — Telephone Encounter (Signed)
 Spoke to wife with release noted in chart; results given and told to go to ED; per wife she will relay the message but unsure if he will go to ED

## 2023-09-17 ENCOUNTER — Telehealth: Payer: Self-pay

## 2023-09-17 ENCOUNTER — Emergency Department (HOSPITAL_BASED_OUTPATIENT_CLINIC_OR_DEPARTMENT_OTHER)

## 2023-09-17 ENCOUNTER — Emergency Department (HOSPITAL_BASED_OUTPATIENT_CLINIC_OR_DEPARTMENT_OTHER)
Admission: EM | Admit: 2023-09-17 | Discharge: 2023-09-17 | Disposition: A | Discharge status: Home / Self Care | Attending: Emergency Medicine | Admitting: Emergency Medicine

## 2023-09-17 ENCOUNTER — Encounter (HOSPITAL_BASED_OUTPATIENT_CLINIC_OR_DEPARTMENT_OTHER): Payer: Self-pay

## 2023-09-17 ENCOUNTER — Other Ambulatory Visit: Payer: Self-pay

## 2023-09-17 ENCOUNTER — Other Ambulatory Visit (HOSPITAL_BASED_OUTPATIENT_CLINIC_OR_DEPARTMENT_OTHER): Payer: Self-pay

## 2023-09-17 DIAGNOSIS — I10 Essential (primary) hypertension: Secondary | ICD-10-CM | POA: Insufficient documentation

## 2023-09-17 DIAGNOSIS — R112 Nausea with vomiting, unspecified: Secondary | ICD-10-CM | POA: Diagnosis not present

## 2023-09-17 DIAGNOSIS — Z79899 Other long term (current) drug therapy: Secondary | ICD-10-CM | POA: Diagnosis not present

## 2023-09-17 DIAGNOSIS — R748 Abnormal levels of other serum enzymes: Secondary | ICD-10-CM | POA: Diagnosis not present

## 2023-09-17 DIAGNOSIS — F1010 Alcohol abuse, uncomplicated: Secondary | ICD-10-CM | POA: Insufficient documentation

## 2023-09-17 DIAGNOSIS — R7989 Other specified abnormal findings of blood chemistry: Secondary | ICD-10-CM

## 2023-09-17 DIAGNOSIS — R7401 Elevation of levels of liver transaminase levels: Secondary | ICD-10-CM | POA: Diagnosis not present

## 2023-09-17 LAB — CBC WITH DIFFERENTIAL/PLATELET
Abs Immature Granulocytes: 0.02 10*3/uL (ref 0.00–0.07)
Basophils Absolute: 0.1 10*3/uL (ref 0.0–0.1)
Basophils Relative: 1 %
Eosinophils Absolute: 0.1 10*3/uL (ref 0.0–0.5)
Eosinophils Relative: 2 %
HCT: 41.3 % (ref 39.0–52.0)
Hemoglobin: 14.9 g/dL (ref 13.0–17.0)
Immature Granulocytes: 0 %
Lymphocytes Relative: 31 %
Lymphs Abs: 1.8 10*3/uL (ref 0.7–4.0)
MCH: 31 pg (ref 26.0–34.0)
MCHC: 36.1 g/dL — ABNORMAL HIGH (ref 30.0–36.0)
MCV: 85.9 fL (ref 80.0–100.0)
Monocytes Absolute: 0.4 10*3/uL (ref 0.1–1.0)
Monocytes Relative: 7 %
Neutro Abs: 3.4 10*3/uL (ref 1.7–7.7)
Neutrophils Relative %: 59 %
Platelets: 226 10*3/uL (ref 150–400)
RBC: 4.81 MIL/uL (ref 4.22–5.81)
RDW: 14.1 % (ref 11.5–15.5)
WBC: 5.7 10*3/uL (ref 4.0–10.5)
nRBC: 0 % (ref 0.0–0.2)

## 2023-09-17 LAB — URINALYSIS, MICROSCOPIC (REFLEX)

## 2023-09-17 LAB — COMPREHENSIVE METABOLIC PANEL WITH GFR
ALT: 640 U/L — ABNORMAL HIGH (ref 0–44)
AST: 310 U/L — ABNORMAL HIGH (ref 15–41)
Albumin: 4.4 g/dL (ref 3.5–5.0)
Alkaline Phosphatase: 119 U/L (ref 38–126)
Anion gap: 17 — ABNORMAL HIGH (ref 5–15)
BUN: 18 mg/dL (ref 6–20)
CO2: 23 mmol/L (ref 22–32)
Calcium: 9.1 mg/dL (ref 8.9–10.3)
Chloride: 91 mmol/L — ABNORMAL LOW (ref 98–111)
Creatinine, Ser: 0.89 mg/dL (ref 0.61–1.24)
GFR, Estimated: >60 mL/min (ref >60)
Glucose, Bld: 137 mg/dL — ABNORMAL HIGH (ref 70–99)
Potassium: 3.5 mmol/L (ref 3.5–5.1)
Sodium: 131 mmol/L — ABNORMAL LOW (ref 135–145)
Total Bilirubin: 1.5 mg/dL — ABNORMAL HIGH (ref 0.0–1.2)
Total Protein: 7.7 g/dL (ref 6.5–8.1)

## 2023-09-17 LAB — PROTIME-INR
INR: 1.1 (ref 0.8–1.2)
Prothrombin Time: 14.5 s (ref 11.4–15.2)

## 2023-09-17 LAB — LIPASE, BLOOD: Lipase: 34 U/L (ref 11–51)

## 2023-09-17 LAB — ACETAMINOPHEN LEVEL: Acetaminophen (Tylenol), Serum: <10 ug/mL — ABNORMAL LOW (ref 10–30)

## 2023-09-17 LAB — HEPATITIS PANEL, ACUTE
HCV Ab: NONREACTIVE
Hep A IgM: NONREACTIVE
Hep B C IgM: NONREACTIVE
Hepatitis B Surface Ag: NONREACTIVE

## 2023-09-17 LAB — URINALYSIS, ROUTINE W REFLEX MICROSCOPIC
Bilirubin Urine: NEGATIVE
Glucose, UA: NEGATIVE mg/dL
Ketones, ur: NEGATIVE mg/dL
Leukocytes,Ua: NEGATIVE
Nitrite: NEGATIVE
Protein, ur: 30 mg/dL — AB
Specific Gravity, Urine: 1.02 (ref 1.005–1.030)
pH: 7 (ref 5.0–8.0)

## 2023-09-17 LAB — CK: Total CK: 305 U/L (ref 49–397)

## 2023-09-17 MED ORDER — ONDANSETRON 4 MG PO TBDP
4.0000 mg | ORAL_TABLET | Freq: Three times a day (TID) | ORAL | 0 refills | Status: DC | PRN
  Filled 2023-09-17: qty 20, 7d supply, fill #0

## 2023-09-17 MED ORDER — PANTOPRAZOLE SODIUM 20 MG PO TBEC
20.0000 mg | DELAYED_RELEASE_TABLET | Freq: Every day | ORAL | 0 refills | Status: DC
  Filled 2023-09-17: qty 14, 14d supply, fill #0

## 2023-09-17 NOTE — Telephone Encounter (Signed)
 Lab orders in epic. MyChart message sent to patient with lab information.

## 2023-09-17 NOTE — ED Triage Notes (Addendum)
 States went UC yesterday, states was bite by a tick a week ago, had fatigue, N/V, loss of of appetite since Friday. States was negative for lyme disease but was told he has liver failure. Drinks daily, last drink yesterday, drinks about 2 beers daily.

## 2023-09-17 NOTE — ED Notes (Signed)
 ED Provider at bedside.

## 2023-09-17 NOTE — ED Provider Notes (Addendum)
 [facility] Provider Note   CSN: [id number] Arrival date & time: 09/17/23  0920     History  Chief Complaint  Patient presents with   Vomiting    Perry Zamora is a 63 y.o. male.  Patient here with nausea vomiting in the last couple days.  Removed a tick from his penis area couple days ago and symptoms started.  He drinks daily at least 2 alcoholic beverages a day heavier on the weekends.  He is not having any abdominal pain.  No diarrhea.  No rash.  Was told to come here after he had lab work done at urgent care a couple days ago that showed elevated liver enzymes.  He denies any heavy Tylenol  use.  He has some chronic back pain.  History of hypertension.  Denies any headache weakness numbness tingling.  The history is provided by the patient.       Home Medications Prior to Admission medications   Medication Sig Start Date End Date Taking? Authorizing Provider  ondansetron  (ZOFRAN -ODT) 4 MG disintegrating tablet Take 1 tablet (4 mg total) by mouth every 8 (eight) hours as needed. 09/17/23  Yes Lonald Troiani, DO  pantoprazole (PROTONIX) 20 MG tablet Take 1 tablet (20 mg total) by mouth daily for 14 days. 09/17/23 10/01/23 Yes Ralphie Lovelady, DO  celecoxib  (CELEBREX ) 200 MG capsule Take 1 capsule (200 mg total) by mouth 2 (two) times daily between meals as needed. Patient not taking: Reported on 09/15/2023 09/01/23   Arnie Lao, MD  diclofenac  (VOLTAREN ) 75 MG EC tablet Take 1 tablet (75 mg) by mouth 2 times daily as needed. Patient not taking: Reported on 09/15/2023 07/30/23   Sagardia, Miguel Jose, MD  doxycycline (VIBRAMYCIN) 100 MG capsule Take 1 capsule (100 mg total) by mouth 2 (two) times daily for 7 days. 09/15/23 09/22/23  Vernestine Gondola, PA-C  escitalopram  (LEXAPRO ) 10 MG tablet Take 1 tablet (10 mg) by mouth daily. Patient not taking: Reported on 09/15/2023 10/28/22   Elvira Hammersmith, MD  hydrochlorothiazide  (HYDRODIURIL )  25 MG tablet Take 1 tablet (25 mg total) by mouth every morning. 10/28/22   Elvira Hammersmith, MD  losartan  (COZAAR ) 50 MG tablet Take 1 tablet (50 mg total) by mouth daily. 04/04/23   Elvira Hammersmith, MD  tamsulosin  (FLOMAX ) 0.4 MG CAPS capsule Take 1 capsule (0.4 mg total) by mouth daily. 04/03/23   Elvira Hammersmith, MD  tiZANidine  (ZANAFLEX ) 4 MG tablet Take 1 tablet (4 mg total) by mouth every 8 (eight) hours as needed for muscle spasms. Patient not taking: Reported on 09/15/2023 09/01/23   Arnie Lao, MD      Allergies    Patient has no known allergies.    Review of Systems   Review of Systems  Physical Exam Updated Vital Signs BP (!) 144/98 (BP Location: Right Arm)   Pulse (!) 111   Temp 98.7 F (37.1 C) (Oral)   Resp 14   Ht 5\' 3"  (1.6 m)   Wt 74.8 kg   SpO2 100%   BMI 29.23 kg/m  Physical Exam Vitals and nursing note reviewed.  Constitutional:      General: He is not in acute distress.    Appearance: He is well-developed. He is not ill-appearing.  HENT:     Head: Normocephalic and atraumatic.     Nose: Nose normal.     Mouth/Throat:     Mouth: Mucous membranes are moist.  Eyes:     Extraocular Movements: Extraocular movements intact.     Conjunctiva/sclera: Conjunctivae normal.     Pupils: Pupils are equal, round, and reactive to light.  Cardiovascular:     Rate and Rhythm: Normal rate and regular rhythm.     Pulses: Normal pulses.     Heart sounds: Normal heart sounds. No murmur heard. Pulmonary:     Effort: Pulmonary effort is normal. No respiratory distress.     Breath sounds: Normal breath sounds.  Abdominal:     General: Abdomen is flat.     Palpations: Abdomen is soft.     Tenderness: There is no abdominal tenderness.  Musculoskeletal:        General: No swelling. Normal range of motion.     Cervical back: Normal range of motion and neck supple.  Skin:    General: Skin is warm and dry.     Capillary Refill: Capillary refill  takes less than 2 seconds.  Neurological:     General: No focal deficit present.     Mental Status: He is alert.  Psychiatric:        Mood and Affect: Mood normal.     ED Results / Procedures / Treatments   Labs (all labs ordered are listed, but only abnormal results are displayed) Labs Reviewed  CBC WITH DIFFERENTIAL/PLATELET - Abnormal; Notable for the following components:      Result Value   MCHC 36.1 (*)    All other components within normal limits  COMPREHENSIVE METABOLIC PANEL WITH GFR - Abnormal; Notable for the following components:   Sodium 131 (*)    Chloride 91 (*)    Glucose, Bld 137 (*)    AST 310 (*)    ALT 640 (*)    Total Bilirubin 1.5 (*)    Anion gap 17 (*)    All other components within normal limits  URINALYSIS, ROUTINE W REFLEX MICROSCOPIC - Abnormal; Notable for the following components:   Hgb urine dipstick MODERATE (*)    Protein, ur 30 (*)    All other components within normal limits  ACETAMINOPHEN  LEVEL - Abnormal; Notable for the following components:   Acetaminophen  (Tylenol ), Serum <10 (*)    All other components within normal limits  URINALYSIS, MICROSCOPIC (REFLEX) - Abnormal; Notable for the following components:   Bacteria, UA RARE (*)    All other components within normal limits  LIPASE, BLOOD  CK  PROTIME-INR  ANTINUCLEAR ANTIBODIES, IFA  HEPATITIS PANEL, ACUTE    EKG None  Radiology US  Abdomen Limited RUQ (LIVER/GB) Result Date: 09/17/2023 CLINICAL DATA:  212411 Elevated liver enzymes 212411. EXAM: ULTRASOUND ABDOMEN LIMITED RIGHT UPPER QUADRANT COMPARISON:  None Available. FINDINGS: Gallbladder: No gallstones or wall thickening visualized. No sonographic Murphy sign noted by sonographer. Common bile duct: Diameter: Up to 3.9 mm.  No intrahepatic bile duct dilation. Liver: There is increased hepatic echogenicity which reduces the sensitivity of ultrasound for the detection of focal masses. That being said, no focal mass is  identified. Portal vein is patent on color Doppler imaging with normal direction of blood flow towards the liver. Other: None. IMPRESSION: *Increased hepatic echogenicity, a nonspecific finding that is most commonly seen on the basis of steatosis in the absence of known liver disease. *Otherwise unremarkable exam. Electronically Signed   By: Beula Brunswick M.D.   On: 09/17/2023 10:57    Procedures Procedures    Medications Ordered in ED Medications - No data to display  ED Course/ Medical  Decision Making/ A&P                                 Medical Decision Making Amount and/or Complexity of Data Reviewed Labs: ordered. Radiology: ordered.  Risk Prescription drug management.   Deirdre Fate Krammes is here with elevated liver enzymes nausea vomiting.  Normal vitals.  No fever.  History of heavy alcohol use.  History of hypertension.  Incidentally a tick bite a couple days ago.  Tested negative for Lyme disease.  No rash.  Does not sound like tick was engorged.  Overall I think he was found to have incidentally elevated liver enzymes in the 700 range at urgent care few days ago and he told him to come for evaluation.  Suspicion is that this is likely alcohol related however could be autoimmune, may be tick related process or may be a hepatitis process.  He denies any IV drug use.  Overall we will check CBC CMP lipase INR ANA Tylenol  level hepatitis panel right upper quadrant ultrasound.  I do not appreciate any ascites on exam.  He has no abdominal pain.  Lab work overall per my review interpretation shows no significant leukocytosis or anemia.  Liver enzymes are actually improved the AST down to 310, ALT down to 640.  Bilirubin 1.5.  Platelets are normal.  INR normal.  CK is normal.  Hepatitis panel pending.  ANA panel pending.  Ultrasound shows some nonspecific hepatic echogenicity.  Commonly seen in steatosis but no other major disease no ascites.  Gallbladder is unremarkable.  I talked with  [facility] team Jessica Zehr.  Patient still with some intermittent nausea but otherwise feels well.  No abdominal pain.  Overall does not appear to be in acute liver failure.  Enzymes are improving.  He is not very symptomatic at this point.  Overall we agree that patient safe for discharge with close follow-up.  I have arranged for a follow-up visit on the 11th of this month.  Patient is aware of this.  He has also been told to follow-up in their office in 2 days for repeat lab work.  I will prescribe Protonix and Zofran  to use as needed.  We discussed at length about alcohol cessation.  Patient discharged in good condition.  Understands return precautions.  This chart was dictated using voice recognition software.  Despite best efforts to proofread,  errors can occur which can change the documentation meaning.         Final Clinical Impression(s) / ED Diagnoses Final diagnoses:  Elevated liver enzymes  ETOH abuse    Rx / DC Orders ED Discharge Orders          Ordered    pantoprazole (PROTONIX) 20 MG tablet  Daily        09/17/23 1110    ondansetron  (ZOFRAN -ODT) 4 MG disintegrating tablet  Every 8 hours PRN        09/17/23 1110              Henok Heacock, DO 09/17/23 1111    Jahon Bart, DO 09/17/23 1112

## 2023-09-17 NOTE — Telephone Encounter (Signed)
-----   Message from Purvis D. Zehr sent at 09/17/2023 10:46 AM EDT ----- Perry Zamora patient is at [facility] for elevated LFTs that were seen at urgent care 2 days ago.  He was told to come to the ER for evaluation of them and is only here now.  Not having any symptoms and LFTs are getting better.  If we can enter for some labs for him to be drawn in 2 days and then if he can be contacted to be instructed where to come to have the labs drawn that would be helpful.  Please enter a BMP, hepatic function panel, PT/INR, AMA, anti-smooth muscle antibody, ferritin/IBC panel, alpha-1 antitrypsin, ceruloplasmin, total IgG, total hep A, hep B surface antibody, celiac titers.  Thank you,  Jess

## 2023-09-17 NOTE — Discharge Instructions (Addendum)
 Please abstain from drinking alcohol.  Talk with your primary care doctor about further support for this if needed.  Follow-up with your gastroenterologist with [facility].  They should be calling you with a follow-up appointment.  Return if symptoms worsen.  Liver enzymes have improved.  GI appointment is scheduled for 6/11. Go to office in two days for repeat liver lab tests. nursing staff reach out to him as well.

## 2023-09-18 ENCOUNTER — Other Ambulatory Visit: Payer: Self-pay | Admitting: Family Medicine

## 2023-09-22 ENCOUNTER — Ambulatory Visit (INDEPENDENT_AMBULATORY_CARE_PROVIDER_SITE_OTHER): Admitting: Physical Medicine and Rehabilitation

## 2023-09-22 ENCOUNTER — Ambulatory Visit: Admitting: Orthopaedic Surgery

## 2023-09-22 ENCOUNTER — Ambulatory Visit: Admitting: Physician Assistant

## 2023-09-22 ENCOUNTER — Other Ambulatory Visit: Payer: Self-pay

## 2023-09-22 VITALS — BP 110/74 | HR 96

## 2023-09-22 DIAGNOSIS — M47816 Spondylosis without myelopathy or radiculopathy, lumbar region: Secondary | ICD-10-CM

## 2023-09-22 MED ORDER — METHYLPREDNISOLONE ACETATE 40 MG/ML IJ SUSP
40.0000 mg | Freq: Once | INTRAMUSCULAR | Status: AC
  Administered 2023-09-22: 40 mg

## 2023-09-22 NOTE — Patient Instructions (Signed)

## 2023-09-22 NOTE — Procedures (Signed)
 Lumbar Facet Joint Intra-Articular Injection(s) with Fluoroscopic Guidance  Patient: Perry Zamora      Date of Birth: 1960/11/19 MRN: [id number] PCP: Elvira Hammersmith, MD      Visit Date: 09/22/2023   Universal Protocol:    Date/Time: 09/22/2023  Consent Given By: the patient  Position: PRONE   Additional Comments: Vital signs were monitored before and after the procedure. Patient was prepped and draped in the usual sterile fashion. The correct patient, procedure, and site was verified.   Injection Procedure Details:  Procedure Site One Meds Administered:  Meds ordered this encounter  Medications   methylPREDNISolone  acetate (DEPO-MEDROL ) injection 40 mg     Laterality: Right  Location/Site:  L5-S1  Needle size: 22 guage  Needle type: Spinal  Needle Placement: Articular  Findings:  -Comments: Excellent flow of contrast producing a partial arthrogram.  Procedure Details: The fluoroscope beam is vertically oriented in AP, and the inferior recess is visualized beneath the lower pole of the inferior apophyseal process, which represents the target point for needle insertion. When direct visualization is difficult the target point is located at the medial projection of the vertebral pedicle. The region overlying each aforementioned target is locally anesthetized with a 1 to 2 ml. volume of 1% Lidocaine  without Epinephrine .   The spinal needle was inserted into each of the above mentioned facet joints using biplanar fluoroscopic guidance. A 0.25 to 0.5 ml. volume of Isovue-250 was injected and a partial facet joint arthrogram was obtained. A single spot film was obtained of the resulting arthrogram.    One to 1.25 ml of the steroid/anesthetic solution was then injected into each of the facet joints noted above.   Additional Comments:  The patient tolerated the procedure well Dressing: 2 x 2 sterile gauze and Band-Aid    Post-procedure details: Patient was observed  during the procedure. Post-procedure instructions were reviewed.  Patient left the clinic in stable condition.

## 2023-09-22 NOTE — Progress Notes (Signed)
 Perry Zamora - 63 y.o. male MRN [id number]  Date of birth: 1961-04-02  Office Visit Note: Visit Date: 09/22/2023 PCP: Elvira Hammersmith, MD Referred by: Elvira Hammersmith, *  Subjective: Chief Complaint  Patient presents with   Lower Back - Pain   HPI:  Perry Zamora is a 63 y.o. male who comes in today at the request of Dr. Norberto Bear for planned Right  L5-S1 Lumbar facet/medial branch block with fluoroscopic guidance.  The patient has failed conservative care including home exercise, medications, time and activity modification.  This injection will be diagnostic and hopefully therapeutic.  Please see requesting physician notes for further details and justification.  Exam has shown concordant pain with facet joint loading.   ROS Otherwise per HPI.  Assessment & Plan: Visit Diagnoses:    ICD-10-CM   1. Spondylosis without myelopathy or radiculopathy, lumbar region  M47.816 XR C-ARM NO REPORT    Facet Injection    methylPREDNISolone  acetate (DEPO-MEDROL ) injection 40 mg      Plan: No additional findings.   Meds & Orders:  Meds ordered this encounter  Medications   methylPREDNISolone  acetate (DEPO-MEDROL ) injection 40 mg    Orders Placed This Encounter  Procedures   Facet Injection   XR C-ARM NO REPORT    Follow-up: Return for visit to requesting provider as needed.   Procedures: No procedures performed  Lumbar Facet Joint Intra-Articular Injection(s) with Fluoroscopic Guidance  Patient: Perry Zamora      Date of Birth: 1961/02/14 MRN: [id number] PCP: Elvira Hammersmith, MD      Visit Date: 09/22/2023   Universal Protocol:    Date/Time: 09/22/2023  Consent Given By: the patient  Position: PRONE   Additional Comments: Vital signs were monitored before and after the procedure. Patient was prepped and draped in the usual sterile fashion. The correct patient, procedure, and site was verified.   Injection Procedure Details:  Procedure Site  One Meds Administered:  Meds ordered this encounter  Medications   methylPREDNISolone  acetate (DEPO-MEDROL ) injection 40 mg     Laterality: Right  Location/Site:  L5-S1  Needle size: 22 guage  Needle type: Spinal  Needle Placement: Articular  Findings:  -Comments: Excellent flow of contrast producing a partial arthrogram.  Procedure Details: The fluoroscope beam is vertically oriented in AP, and the inferior recess is visualized beneath the lower pole of the inferior apophyseal process, which represents the target point for needle insertion. When direct visualization is difficult the target point is located at the medial projection of the vertebral pedicle. The region overlying each aforementioned target is locally anesthetized with a 1 to 2 ml. volume of 1% Lidocaine  without Epinephrine .   The spinal needle was inserted into each of the above mentioned facet joints using biplanar fluoroscopic guidance. A 0.25 to 0.5 ml. volume of Isovue-250 was injected and a partial facet joint arthrogram was obtained. A single spot film was obtained of the resulting arthrogram.    One to 1.25 ml of the steroid/anesthetic solution was then injected into each of the facet joints noted above.   Additional Comments:  The patient tolerated the procedure well Dressing: 2 x 2 sterile gauze and Band-Aid    Post-procedure details: Patient was observed during the procedure. Post-procedure instructions were reviewed.  Patient left the clinic in stable condition.    Clinical History: CLINICAL DATA:  Low back pain radiating down bilateral legs first 2 years   EXAM: MRI LUMBAR SPINE WITHOUT CONTRAST  TECHNIQUE: Multiplanar, multisequence MR imaging of the lumbar spine was performed. No intravenous contrast was administered.   COMPARISON:  None Available.   FINDINGS: Segmentation:  Standard.   Alignment:  Physiologic.   Vertebrae: No acute fracture, evidence of discitis, or  aggressive bone lesion.   Conus medullaris and cauda equina: Conus extends to the L1 level. Conus and cauda equina appear normal.   Paraspinal and other soft tissues: No acute paraspinal abnormality.   Disc levels:   Disc spaces: Mild disc height loss at L2-3 and L3-4.   T12-L1: 0   L1-L2: No significant disc bulge. No neural foraminal stenosis. No central canal stenosis.   L2-L3: Mild broad-based disc bulge. Mild bilateral facet arthropathy. No foraminal or central canal stenosis.   L3-L4: Mild broad-based disc bulge. Mild bilateral facet arthropathy. No foraminal or central canal stenosis.   L4-L5: Mild broad-based disc bulge. Moderate bilateral facet arthropathy with a small left facet effusion. Mild spinal stenosis. No foraminal stenosis.   L5-S1: Mild broad-based disc bulge. Severe right and mild left facet arthropathy. Mild right foraminal stenosis. No left foraminal stenosis. No spinal stenosis.   IMPRESSION: 1. Lumbar spine spondylosis as described above. 2. No acute osseous injury of the lumbar spine.     Electronically Signed   By: Onnie Bilis M.D.   On: 10/07/2022 09:27     Objective:  VS:  HT:    WT:   BMI:     BP:110/74  HR:96bpm  TEMP: ( )  RESP:  Physical Exam Vitals and nursing note reviewed.  Constitutional:      General: He is not in acute distress.    Appearance: Normal appearance. He is not ill-appearing.  HENT:     Head: Normocephalic and atraumatic.     Right Ear: External ear normal.     Left Ear: External ear normal.     Nose: No congestion.  Eyes:     Extraocular Movements: Extraocular movements intact.  Cardiovascular:     Rate and Rhythm: Normal rate.     Pulses: Normal pulses.  Pulmonary:     Effort: Pulmonary effort is normal. No respiratory distress.  Abdominal:     General: There is no distension.     Palpations: Abdomen is soft.  Musculoskeletal:        General: No tenderness or signs of injury.     Cervical back:  Neck supple.     Right lower leg: No edema.     Left lower leg: No edema.     Comments: Patient has good distal strength without clonus.  Skin:    Findings: No erythema or rash.  Neurological:     General: No focal deficit present.     Mental Status: He is alert and oriented to person, place, and time.     Sensory: No sensory deficit.     Motor: No weakness or abnormal muscle tone.     Coordination: Coordination normal.  Psychiatric:        Mood and Affect: Mood normal.        Behavior: Behavior normal.      Imaging: XR C-ARM NO REPORT Result Date: 09/22/2023 Please see Notes tab for imaging impression.

## 2023-09-22 NOTE — Progress Notes (Signed)
 Patient is here for a right L5-S1 facet inj. Pain on right lower back. Pain radiates down into thigh. 1st injection did not help 11/12/2022. Drives all day for work. Constant pain. Cannot get comfortable. Right  pain buttock        Pain Scale   Average Pain 5        +Driver, -BT, -Dye Allergies.

## 2023-09-24 ENCOUNTER — Ambulatory Visit: Admitting: Gastroenterology

## 2023-09-25 ENCOUNTER — Other Ambulatory Visit: Payer: Self-pay | Admitting: Emergency Medicine

## 2023-09-25 ENCOUNTER — Other Ambulatory Visit (HOSPITAL_COMMUNITY): Payer: Self-pay

## 2023-09-25 ENCOUNTER — Other Ambulatory Visit: Payer: Self-pay

## 2023-09-25 MED ORDER — DICLOFENAC SODIUM 75 MG PO TBEC
75.0000 mg | DELAYED_RELEASE_TABLET | Freq: Two times a day (BID) | ORAL | 1 refills | Status: DC | PRN
  Filled 2023-09-25: qty 30, 15d supply, fill #0

## 2023-09-26 ENCOUNTER — Other Ambulatory Visit (HOSPITAL_COMMUNITY): Payer: Self-pay

## 2023-09-29 ENCOUNTER — Encounter: Payer: Self-pay | Admitting: Physician Assistant

## 2023-09-29 ENCOUNTER — Ambulatory Visit (INDEPENDENT_AMBULATORY_CARE_PROVIDER_SITE_OTHER): Admitting: Physician Assistant

## 2023-09-29 VITALS — BP 105/71

## 2023-09-29 DIAGNOSIS — L821 Other seborrheic keratosis: Secondary | ICD-10-CM | POA: Diagnosis not present

## 2023-09-29 DIAGNOSIS — Z808 Family history of malignant neoplasm of other organs or systems: Secondary | ICD-10-CM

## 2023-09-29 DIAGNOSIS — L578 Other skin changes due to chronic exposure to nonionizing radiation: Secondary | ICD-10-CM

## 2023-09-29 DIAGNOSIS — D492 Neoplasm of unspecified behavior of bone, soft tissue, and skin: Secondary | ICD-10-CM | POA: Diagnosis not present

## 2023-09-29 DIAGNOSIS — C44612 Basal cell carcinoma of skin of right upper limb, including shoulder: Secondary | ICD-10-CM

## 2023-09-29 DIAGNOSIS — D485 Neoplasm of uncertain behavior of skin: Secondary | ICD-10-CM

## 2023-09-29 DIAGNOSIS — Z1283 Encounter for screening for malignant neoplasm of skin: Secondary | ICD-10-CM

## 2023-09-29 DIAGNOSIS — L814 Other melanin hyperpigmentation: Secondary | ICD-10-CM | POA: Diagnosis not present

## 2023-09-29 DIAGNOSIS — D229 Melanocytic nevi, unspecified: Secondary | ICD-10-CM

## 2023-09-29 DIAGNOSIS — D1801 Hemangioma of skin and subcutaneous tissue: Secondary | ICD-10-CM

## 2023-09-29 DIAGNOSIS — C4491 Basal cell carcinoma of skin, unspecified: Secondary | ICD-10-CM

## 2023-09-29 DIAGNOSIS — W908XXA Exposure to other nonionizing radiation, initial encounter: Secondary | ICD-10-CM

## 2023-09-29 HISTORY — DX: Basal cell carcinoma of skin, unspecified: C44.91

## 2023-09-29 LAB — FANA STAINING PATTERNS: Nucleolar Pattern: 8910

## 2023-09-29 LAB — ANTINUCLEAR ANTIBODIES, IFA: ANA Ab, IFA: POSITIVE — AB

## 2023-09-29 NOTE — Progress Notes (Signed)
   Follow-Up Visit   Subjective  Perry Zamora is a 63 y.o. male who presents for the following: Skin Cancer Screening and Upper Body Skin Exam - Spot of right chest x years. It scabs over and bleeds sometimes. It constantly itches or hurts. He has been using neosporin and a band aid. No history of skin cancer. Family history of Melanoma - paternal uncle   The patient presents for Upper Body Skin Exam (UBSE) for skin cancer screening and mole check. The patient has spots, moles and lesions to be evaluated, some may be new or changing and the patient may have concern these could be cancer.    The following portions of the chart were reviewed this encounter and updated as appropriate: medications, allergies, medical history  Review of Systems:  No other skin or systemic complaints except as noted in HPI or Assessment and Plan.  Objective  Well appearing patient in no apparent distress; mood and affect are within normal limits.  All skin waist up examined. Relevant physical exam findings are noted in the Assessment and Plan.  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   Assessment & Plan   NEOPLASM OF UNCERTAIN BEHAVIOR OF SKIN Right ant shoulder Skin / nail biopsy Type of biopsy: tangential   Informed consent: discussed and consent obtained   Timeout: patient name, date of birth, surgical site, and procedure verified   Procedure prep:  Patient was prepped and draped in usual sterile fashion Prep type:  Isopropyl alcohol Anesthesia: the lesion was anesthetized in a standard fashion   Anesthetic:  1% lidocaine  w/ epinephrine  1-100,000 buffered w/ 8.4% NaHCO3 Instrument used: flexible razor blade   Hemostasis achieved with: pressure, aluminum chloride and electrodesiccation   Outcome: patient tolerated procedure well   Post-procedure details: sterile dressing applied and wound care instructions given   Dressing type: bandage and petrolatum   Specimen 1 - Surgical  pathology Differential Diagnosis: BCC vs other  Check Margins: No SCREENING EXAM FOR SKIN CANCER   MULTIPLE PIGMENTED NEVI   ACTINIC SKIN DAMAGE   CHERRY ANGIOMA   LENTIGINES   SEBORRHEIC KERATOSIS   FAMILY HISTORY OF MALIGNANT MELANOMA   Skin cancer screening performed today.  Actinic Damage - Chronic condition, secondary to cumulative UV/sun exposure - diffuse scaly erythematous macules with underlying dyspigmentation - Recommend daily broad spectrum sunscreen SPF 30+ to sun-exposed areas, reapply every 2 hours as needed.  - Staying in the shade or wearing long sleeves, sun glasses (UVA+UVB protection) and wide brim hats (4-inch brim around the entire circumference of the hat) are also recommended for sun protection.  - Call for new or changing lesions.  Lentigines, Seborrheic Keratoses, Cherry angiomas - Benign normal skin lesions - Benign-appearing - Call for any changes  Melanocytic Nevi - Tan-brown and/or pink-flesh-colored symmetric macules and papules - Benign appearing on exam today - Observation - Call clinic for new or changing moles - Recommend daily use of broad spectrum spf 30+ sunscreen to sun-exposed areas.   Family history of malignant melanoma - Recommend routine skin exams.    Return in about 6 months (around 03/30/2024) for TBSE.  I, Eliot Guernsey, CMA, am acting as scribe for Orra Nolde K, PA-C .   Documentation: I have reviewed the above documentation for accuracy and completeness, and I agree with the above.  Nazyia Gaugh K, PA-C

## 2023-09-29 NOTE — Patient Instructions (Signed)

## 2023-09-29 NOTE — Progress Notes (Deleted)
   New Patient Visit   Subjective  Perry Zamora is a 63 y.o. male who presents for the following: Spot of right chest x years. It scabs over and bleeds sometimes. It constantly itches or hurts. He has been using neosporin and a band aid.    The following portions of the chart were reviewed this encounter and updated as appropriate: medications, allergies, medical history  Review of Systems:  No other skin or systemic complaints except as noted in HPI or Assessment and Plan.  Objective  Well appearing patient in no apparent distress; mood and affect are within normal limits.   A focused examination was performed of the following areas: chest   Relevant exam findings are noted in the Assessment and Plan.    Assessment & Plan       No follow-ups on file.  ***  Documentation: I have reviewed the above documentation for accuracy and completeness, and I agree with the above.  SANDRIDGE,BRENDA K, PA-C

## 2023-10-01 LAB — SURGICAL PATHOLOGY

## 2023-10-02 ENCOUNTER — Encounter: Payer: Self-pay | Admitting: Emergency Medicine

## 2023-10-02 ENCOUNTER — Ambulatory Visit (INDEPENDENT_AMBULATORY_CARE_PROVIDER_SITE_OTHER)

## 2023-10-02 ENCOUNTER — Ambulatory Visit: Payer: Self-pay | Admitting: Emergency Medicine

## 2023-10-02 ENCOUNTER — Ambulatory Visit: Admitting: Emergency Medicine

## 2023-10-02 ENCOUNTER — Other Ambulatory Visit (HOSPITAL_COMMUNITY): Payer: Self-pay

## 2023-10-02 VITALS — BP 138/88 | HR 71 | Temp 98.3°F | Ht 63.0 in | Wt 170.0 lb

## 2023-10-02 DIAGNOSIS — M25531 Pain in right wrist: Secondary | ICD-10-CM

## 2023-10-02 DIAGNOSIS — G5601 Carpal tunnel syndrome, right upper limb: Secondary | ICD-10-CM

## 2023-10-02 DIAGNOSIS — M7989 Other specified soft tissue disorders: Secondary | ICD-10-CM | POA: Diagnosis not present

## 2023-10-02 MED ORDER — HYDROCODONE-ACETAMINOPHEN 10-325 MG PO TABS
1.0000 | ORAL_TABLET | Freq: Three times a day (TID) | ORAL | 0 refills | Status: DC | PRN
  Filled 2023-10-02: qty 15, 5d supply, fill #0

## 2023-10-02 MED ORDER — PREDNISONE 20 MG PO TABS
20.0000 mg | ORAL_TABLET | Freq: Every day | ORAL | 0 refills | Status: DC
  Filled 2023-10-02: qty 5, 5d supply, fill #0

## 2023-10-02 NOTE — Assessment & Plan Note (Signed)
 Pain management discussed Over-the-counter analgesics not working Recommend to take Norco for moderate to severe pain as needed

## 2023-10-02 NOTE — Progress Notes (Signed)
 Perry Zamora 63 y.o.   Chief Complaint  Patient presents with  . Numbness    Patient here for Right hand numbness and swelling  that has been going on for a week now. States when he try's to grab something he has a shocking sensation, he also can't sleep because he has a constant burning feeling. Patient states aleve, Tylenol  has not helped with the pain.      HISTORY OF PRESENT ILLNESS: This is a 63 y.o. male complaining of pain and swelling to right wrist and hand for about a week now.  Denies injury. Over-the-counter analgesics not helping Denies injury.  No other associated symptoms No other complaints or medical concerns today.  HPI   Prior to Admission medications   Medication Sig Start Date End Date Taking? Authorizing Provider  celecoxib  (CELEBREX ) 200 MG capsule Take 1 capsule (200 mg total) by mouth 2 (two) times daily between meals as needed. 09/01/23  Yes Arnie Lao, MD  diclofenac  (VOLTAREN ) 75 MG EC tablet Take 1 tablet (75 mg) by mouth 2 times daily as needed. 09/25/23  Yes Harsha Yusko, Isidro Margo, MD  hydrochlorothiazide  (HYDRODIURIL ) 25 MG tablet Take 1 tablet (25 mg total) by mouth every morning. 10/28/22  Yes [place] Noori Jose, MD  HYDROcodone-acetaminophen  (NORCO) 10-325 MG tablet Take 1 tablet by mouth every 8 (eight) hours as needed for up to 5 days. 10/02/23 10/07/23 Yes Joan Herschberger, Isidro Margo, MD  ondansetron  (ZOFRAN -ODT) 4 MG disintegrating tablet Take 1 tablet (4 mg total) by mouth every 8 (eight) hours as needed. 09/17/23  Yes Curatolo, Adam, DO  pantoprazole  (PROTONIX ) 20 MG tablet Take 1 tablet (20 mg total) by mouth daily for 14 days. 09/17/23 10/02/23 Yes Curatolo, Adam, DO  predniSONE (DELTASONE) 20 MG tablet Take 1 tablet (20 mg total) by mouth daily with breakfast for 5 days. 10/02/23 10/07/23 Yes Paysen Goza, Isidro Margo, MD  tamsulosin  (FLOMAX ) 0.4 MG CAPS capsule Take 1 capsule (0.4 mg total) by mouth daily. 04/03/23  Yes Coltyn Hanning, Isidro Margo, MD   tiZANidine  (ZANAFLEX ) 4 MG tablet Take 1 tablet (4 mg total) by mouth every 8 (eight) hours as needed for muscle spasms. 09/01/23  Yes Arnie Lao, MD  escitalopram  (LEXAPRO ) 10 MG tablet Take 1 tablet (10 mg) by mouth daily. Patient not taking: Reported on 10/02/2023 10/28/22   Morena Mckissack Jose, MD  losartan  (COZAAR ) 50 MG tablet Take 1 tablet (50 mg total) by mouth daily. Patient not taking: Reported on 10/02/2023 04/04/23   Elvira Hammersmith, MD    No Known Allergies  Patient Active Problem List   Diagnosis Date Noted  . 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  . Right wrist pain 10/02/2023  . Swelling of right hand 10/02/2023  . Anxiety 06/26/2023  . 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 History of colonic diverticulitis 05/26/2023  . Recurrent UTI (urinary tract infection) 05/26/2023  . Lower urinary tract symptoms 12/09/2022  . Hepatic steatosis 09/25/2022  . Transaminitis 09/25/2022  . Chronic right-sided low back pain with right-sided sciatica 09/24/2022  . Essential hypertension 09/24/2022  . Pulmonary nodule 03/25/2020  . Elevated PSA 03/24/2020    Past Medical History:  Diagnosis Date  . HTN (hypertension)     Past Surgical History:  Procedure Laterality Date  . FLEXIBLE SIGMOIDOSCOPY N/A 06/25/2023   Procedure: Marlynn Singer;  Surgeon: Candyce Champagne, MD;  Location: [facility];  Service: General;  Laterality: N/A;    Social History   Socioeconomic History  . Marital  status: Married    Spouse name: Not on file  . Number of children: Not on file  . Years of education: Not on file  . Highest education level: Some college, no degree  Occupational History  . Not on file  Tobacco Use  . Smoking status: Never  . Smokeless tobacco: Never  Vaping Use  . Vaping status: Never Used  Substance and Sexual Activity  . Alcohol use: Yes    Alcohol/week: 14.0 standard drinks of alcohol    Types: 14 Cans of beer per week    Comment: daily   . Drug use: Never  . Sexual activity: Not on file  Other Topics Concern  . Not on file  Social History Narrative   Family originally from [place]   Social Drivers of Health   Financial Resource Strain: Medium Risk (10/01/2023)   Overall Financial Resource Strain (CARDIA)   . Difficulty of Paying Living Expenses: Somewhat hard  Food Insecurity: No Food Insecurity (10/01/2023)   Hunger Vital Sign   . Worried About Programme researcher, broadcasting/film/video in the Last Year: Never true   . Ran Out of Food in the Last Year: Never true  Transportation Needs: No Transportation Needs (10/01/2023)   PRAPARE - Transportation   . Lack of Transportation (Medical): No   . Lack of Transportation (Non-Medical): No  Physical Activity: Sufficiently Active (10/01/2023)   Exercise Vital Sign   . Days of Exercise per Week: 5 days   . Minutes of Exercise per Session: 60 min  Stress: Stress Concern Present (10/01/2023)   [employer] of Occupational Health - Occupational Stress Questionnaire   . Feeling of Stress: To some extent  Social Connections: Moderately Isolated (10/01/2023)   Social Connection and Isolation Panel   . Frequency of Communication with Friends and Family: More than three times a week   . Frequency of Social Gatherings with Friends and Family: Patient declined   . Attends Religious Services: Patient declined   . Active Member of Clubs or Organizations: No   . Attends Banker Meetings: Not on file   . Marital Status: Married  Catering manager Violence: Not At Risk (06/25/2023)   Humiliation, Afraid, Rape, and Kick questionnaire   . Fear of Current or Ex-Partner: No   . Emotionally Abused: No   . Physically Abused: No   . Sexually Abused: No    Family History  Problem Relation Age of Onset  . Colon cancer Neg Hx   . Colon polyps Neg Hx   . Esophageal cancer Neg Hx   . Rectal cancer Neg Hx   . Stomach cancer Neg Hx      Review of Systems  Constitutional: Negative.  Negative  for chills and fever.  HENT: Negative.  Negative for congestion and sore throat.   Respiratory: Negative.  Negative for cough and shortness of breath.   Cardiovascular: Negative.  Negative for chest pain and palpitations.  Gastrointestinal:  Negative for abdominal pain, diarrhea, nausea and vomiting.  Genitourinary: Negative.  Negative for dysuria and hematuria.  Skin: Negative.  Negative for rash.  Neurological:  Negative for dizziness and headaches.  All other systems reviewed and are negative.   Vitals:   10/02/23 1511  BP: 138/88  Pulse: 71  Temp: 98.3 F (36.8 C)  SpO2: 96%    Physical Exam Vitals reviewed.  Constitutional:      Appearance: Normal appearance.  HENT:     Head: Normocephalic.   Eyes:  Extraocular Movements: Extraocular movements intact.    Cardiovascular:     Rate and Rhythm: Normal rate.  Pulmonary:     Effort: Pulmonary effort is normal.   Musculoskeletal:     Comments: Right hand/wrist: Warm to touch.  Good peripheral pulses.  Swelling of the hands and fingers.  Unable to fully make a fist.  Decreased range of motion to right wrist.   Skin:    General: Skin is warm and dry.   Neurological:     Mental Status: He is alert.   Psychiatric:        Mood and Affect: Mood normal.        Behavior: Behavior normal.   DG Wrist 2 Views Right Result Date: 10/02/2023 CLINICAL DATA:  Right wrist pain for 1 week, swelling for 1 day EXAM: RIGHT WRIST - 2 VIEW COMPARISON:  None Available. FINDINGS: Frontal and lateral views of the right wrist are obtained. No acute fracture, subluxation, or dislocation. Joint spaces are well preserved. Soft tissues are unremarkable. IMPRESSION: 1. Unremarkable right wrist. Electronically Signed   By: Bobbye Burrow M.D.   On: 10/02/2023 16:18     ASSESSMENT & PLAN: A total of 32 minutes was spent with the patient and counseling/coordination of care regarding preparing for this visit, review of most recent office visit  notes, review of today's x-ray images, differential diagnosis of right hand/wrist pain including possibility of carpal tunnel syndrome, pain management, need for orthopedic evaluation, prognosis, documentation and need for follow-up.  Problem List Items Addressed This Visit       Nervous and Auditory   Carpal tunnel syndrome of right wrist - Primary   Needs orthopedic evaluation. Referral placed today Very symptomatic at present time Recommend short course of oral corticosteroid Prednisone 20 mg daily for 5 days Pain management discussed Unremarkable x-ray report.      Relevant Orders   Ambulatory referral to Sports Medicine   DG Wrist 2 Views Right (Completed)     Other   Right wrist pain   Pain management discussed Over-the-counter analgesics not working Recommend to take Norco for moderate to severe pain as needed      Relevant Medications   HYDROcodone-acetaminophen  (NORCO) 10-325 MG tablet   Other Relevant Orders   Ambulatory referral to Sports Medicine   DG Wrist 2 Views Right (Completed)   Swelling of right hand   Recommend to start prednisone 20 mg daily for 5 days Needs orthopedic evaluation Pain management discussed X-ray images reviewed today      Relevant Medications   predniSONE (DELTASONE) 20 MG tablet   Other Relevant Orders   Ambulatory referral to Sports Medicine   DG Wrist 2 Views Right (Completed)   Patient Instructions  Pinched Nerve in the Wrist (Carpal Tunnel Syndrome): What to Know  Pinched nerve in the wrist (carpal tunnel syndrome, or CTS) is a nerve problem that causes pain, numbness, and weakness in the wrist, hand, and fingers. The carpal tunnel is a narrow space that is on the palm side of your wrist. Repeated wrist motions or certain diseases may cause swelling in the tunnel. This swelling can pinch the main nerve in the wrist (the median nerve). What are the causes? CTS may be caused by: Moving your hand and wrist over and over again  while doing a task. Hurting the wrist. Arthritis. A pocket of fluid (cyst) or a growth (tumor) in the carpal tunnel. Fluid buildup when you are pregnant. Use of tools that vibrate. In  some cases, the cause of CTS is not known. What increases the risk? You're more likely to have CTS if: You have a job that makes you do these things: Move your hand firmly over and over again. Work with tools that vibrate, such as drills or sanders. You're male. You have diabetes, obesity, thyroid problems, or kidney failure. What are the signs or symptoms? Symptoms of this condition include: A tingling feeling in your fingers. You may feel this pain in the thumb, index finger, or middle finger. Tingling or loss of feeling in your hand. Pain in your entire arm. This pain may get worse when you bend your wrist and elbow for a long time. Pain in your wrist that goes up your arm to your shoulder. Pain that goes down into your palm or fingers. Weakness in your hands. You may find it hard to grab and hold items. Your symptoms may feel worse during the night. How is this diagnosed? CTS is diagnosed with a medical history and physical exam. Tests and imaging may also be done to: Check the electrical signals sent by your nerves into the muscles. Check how well electrical signals pass through your nerves. Check possible causes of your CTS. These include X-rays, ultrasound, and MRI. How is this treated? CTS may be treated with: Lifestyle changes. You will be asked to stop or change the activity that caused your problem. Physical therapy. This may include: Exercises that stretch and strengthen the muscles and tendons in the wrist and hand. Nerve gliding or flossing exercises. These help keep nerves moving smoothly through the tissues around them. Occupational therapy. You'll learn how to use your hand again. Medicines for pain and swelling. You may have injections in your wrist. A wrist splint or  brace. Surgery. Follow these instructions at home: If you have a splint or brace: Wear the splint or brace as told. Take it off only if your provider says you can. Check the skin around it every day. Tell your provider if you see problems. Loosen the splint or brace if your fingers tingle, are numb, or turn cold and blue. Keep the splint or brace clean and dry. If the splint or brace isn't waterproof: Do not let it get wet. Cover it when you take a bath or shower. Use a cover that doesn't let any water  in. Managing pain, stiffness, and swelling  Use ice or an ice pack as told. If you have a splint or brace that you can take off, remove it only as told. Place a towel between your skin and the ice. Leave the ice on for 20 minutes, 2-3 times a day. If your skin turns red, take off the ice right away to prevent skin damage. The risk of damage is higher if you can't feel pain, heat, or cold. Move your fingers often to reduce stiffness and swelling. General instructions Take your medicines only as told. Rest your wrist and hand from activity that may cause pain. If your CTS is caused by things you do at work, talk with your employer about making changes. For example, you may need a wrist pad to use while typing. Exercise as told. Follow instructions on how to do nerve gliding or flossing exercises. These help keep nerves in moving smoothly through the tissues around them. Keep all follow-up visits. This is important. Where to find more information American Academy of Orthopedic Surgeons: orthoinfo.aaos.[employer] of Neurological Disorders and Stroke: BasicFM.no Contact a health care provider if: You have  new symptoms. Your pain is not controlled with medicines. Your symptoms get worse. Get help right away if: Your hand or wrist tingles or is numb, and the symptoms become very bad. This information is not intended to replace advice given to you by your health care provider.  Make sure you discuss any questions you have with your health care provider. Document Revised: 02/11/2023 Document Reviewed: 11/29/2022 Elsevier Patient Education  2024 Elsevier Inc.    Maryagnes Small, MD [facility]

## 2023-10-02 NOTE — Assessment & Plan Note (Signed)
 Recommend to start prednisone 20 mg daily for 5 days Needs orthopedic evaluation Pain management discussed X-ray images reviewed today

## 2023-10-02 NOTE — Patient Instructions (Signed)
 Pinched Nerve in the Wrist (Carpal Tunnel Syndrome): What to Know  Pinched nerve in the wrist (carpal tunnel syndrome, or CTS) is a nerve problem that causes pain, numbness, and weakness in the wrist, hand, and fingers. The carpal tunnel is a narrow space that is on the palm side of your wrist. Repeated wrist motions or certain diseases may cause swelling in the tunnel. This swelling can pinch the main nerve in the wrist (the median nerve). What are the causes? CTS may be caused by: Moving your hand and wrist over and over again while doing a task. Hurting the wrist. Arthritis. A pocket of fluid (cyst) or a growth (tumor) in the carpal tunnel. Fluid buildup when you are pregnant. Use of tools that vibrate. In some cases, the cause of CTS is not known. What increases the risk? You're more likely to have CTS if: You have a job that makes you do these things: Move your hand firmly over and over again. Work with tools that vibrate, such as drills or sanders. You're male. You have diabetes, obesity, thyroid problems, or kidney failure. What are the signs or symptoms? Symptoms of this condition include: A tingling feeling in your fingers. You may feel this pain in the thumb, index finger, or middle finger. Tingling or loss of feeling in your hand. Pain in your entire arm. This pain may get worse when you bend your wrist and elbow for a long time. Pain in your wrist that goes up your arm to your shoulder. Pain that goes down into your palm or fingers. Weakness in your hands. You may find it hard to grab and hold items. Your symptoms may feel worse during the night. How is this diagnosed? CTS is diagnosed with a medical history and physical exam. Tests and imaging may also be done to: Check the electrical signals sent by your nerves into the muscles. Check how well electrical signals pass through your nerves. Check possible causes of your CTS. These include X-rays, ultrasound, and  MRI. How is this treated? CTS may be treated with: Lifestyle changes. You will be asked to stop or change the activity that caused your problem. Physical therapy. This may include: Exercises that stretch and strengthen the muscles and tendons in the wrist and hand. Nerve gliding or flossing exercises. These help keep nerves moving smoothly through the tissues around them. Occupational therapy. You'll learn how to use your hand again. Medicines for pain and swelling. You may have injections in your wrist. A wrist splint or brace. Surgery. Follow these instructions at home: If you have a splint or brace: Wear the splint or brace as told. Take it off only if your provider says you can. Check the skin around it every day. Tell your provider if you see problems. Loosen the splint or brace if your fingers tingle, are numb, or turn cold and blue. Keep the splint or brace clean and dry. If the splint or brace isn't waterproof: Do not let it get wet. Cover it when you take a bath or shower. Use a cover that doesn't let any water in. Managing pain, stiffness, and swelling  Use ice or an ice pack as told. If you have a splint or brace that you can take off, remove it only as told. Place a towel between your skin and the ice. Leave the ice on for 20 minutes, 2-3 times a day. If your skin turns red, take off the ice right away to prevent skin damage. The risk  of damage is higher if you can't feel pain, heat, or cold. Move your fingers often to reduce stiffness and swelling. General instructions Take your medicines only as told. Rest your wrist and hand from activity that may cause pain. If your CTS is caused by things you do at work, talk with your employer about making changes. For example, you may need a wrist pad to use while typing. Exercise as told. Follow instructions on how to do nerve gliding or flossing exercises. These help keep nerves in moving smoothly through the tissues around  them. Keep all follow-up visits. This is important. Where to find more information American Academy of Orthopedic Surgeons: orthoinfo.aaos.[employer] of Neurological Disorders and Stroke: BasicFM.no Contact a health care provider if: You have new symptoms. Your pain is not controlled with medicines. Your symptoms get worse. Get help right away if: Your hand or wrist tingles or is numb, and the symptoms become very bad. This information is not intended to replace advice given to you by your health care provider. Make sure you discuss any questions you have with your health care provider. Document Revised: 02/11/2023 Document Reviewed: 11/29/2022 Elsevier Patient Education  2024 [employer].

## 2023-10-02 NOTE — Assessment & Plan Note (Signed)
 Needs orthopedic evaluation. Referral placed today Very symptomatic at present time Recommend short course of oral corticosteroid Prednisone 20 mg daily for 5 days Pain management discussed Unremarkable x-ray report.

## 2023-10-03 ENCOUNTER — Other Ambulatory Visit: Payer: Self-pay

## 2023-10-03 ENCOUNTER — Ambulatory Visit: Admitting: Sports Medicine

## 2023-10-03 VITALS — BP 118/82 | HR 78 | Ht 63.0 in | Wt 166.0 lb

## 2023-10-03 DIAGNOSIS — M25531 Pain in right wrist: Secondary | ICD-10-CM

## 2023-10-03 DIAGNOSIS — G5601 Carpal tunnel syndrome, right upper limb: Secondary | ICD-10-CM

## 2023-10-03 NOTE — Progress Notes (Signed)
 Ben Sagan Wurzel D.[facility] [address] [place] [address] Phone: [phone]   Assessment and Plan:     1. Right wrist pain (Primary) 2. 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  Tolerated well per note below - As CSI will take 1 to 2 days to start becoming effective, recommend continuing prednisone Dosepak - Start HEP for carpal tunnel  Procedure: Ultrasound Guided Carpal Tunnel Injection Side: Right Diagnosis: Carpal tunnel syndrome US  Indication:  - accuracy is paramount for diagnosis - to ensure therapeutic efficacy or procedural success - to reduce procedural risk  After explaining the procedure, viable alternatives, risks, and answering any questions, consent was given verbally. The site was cleaned with chlorhexidine  prep. An ultrasound transducer was placed on the volar aspect of the wrist.  The median nerve was identified.   An injection was performed under ultrasound guidance from the ulnar approach with care used to avoid ulnar artery.  1ml of 2% lidocaine  without epinephrine  was used to hydrodissect the median nerve from the retinaculum.  40 mg of triamcinolone (KENALOG) 40mg /ml was then deposited adjacent to but not into the median nerve.  This was well tolerated and resulted in  relief.  Needle was removed and dressing placed and post injection instructions were given including  a discussion of likely return of pain today after the anesthetic wears off (with the possibility of worsened pain) until the steroid starts to work in 1-3 days.   Pt was advised to call or return to clinic if these symptoms worsen or fail to improve as anticipated. Images permanently stored.  15 additional  minutes spent for educating Therapeutic Home Exercise Program.  This included exercises focusing on stretching, strengthening, with focus on eccentric aspects.   Long term goals include an improvement in range of motion, strength, endurance as well as avoiding reinjury. Patient's frequency would include in 1-2 times a day, 3-5 times a week for a duration of 6-12 weeks. Proper technique shown and discussed handout in great detail with ATC.  All questions were discussed and answered.     Pertinent previous records reviewed include internal medicine note 10/02/2023, right wrist x-ray 10/02/2023  Follow Up: 3 weeks for reevaluation.  Could consider orthopedic surgery referral for carpal tunnel release if no improvement   Subjective:   I, Moenique Parris, am serving as a Neurosurgeon for Doctor Ulysees Gander  Chief Complaint: right wrist pain  HPI:   10/03/23 Patient is a 63 year old male with right wrist pain. Patient states pain for years. His arm goes numb. He isnt able to sleep through the night. Was prescribed hydro and prednisone. Does get tingling and numbness. Decreased ROM . Grip strength decrease. When he grabs things he gets an electric shock. Has tried sleeping braces and those dont help  Relevant Historical Information: Hypertension  Additional pertinent review of systems negative.   Current Outpatient Medications:    celecoxib  (CELEBREX ) 200 MG capsule, Take 1 capsule (200 mg total) by mouth 2 (two) times daily between meals as needed., Disp: 60 capsule, Rfl: 1   diclofenac  (VOLTAREN ) 75 MG EC tablet, Take 1 tablet (75 mg) by mouth 2 times daily as needed., Disp: 30 tablet, Rfl: 1   hydrochlorothiazide  (HYDRODIURIL ) 25 MG  tablet, Take 1 tablet (25 mg total) by mouth every morning., Disp: 90 tablet, Rfl: 3   HYDROcodone-acetaminophen  (NORCO) 10-325 MG tablet, Take 1 tablet by mouth every 8 (eight) hours as needed for up to 5 days., Disp: 15 tablet, Rfl: 0   ondansetron  (ZOFRAN -ODT) 4 MG  disintegrating tablet, Take 1 tablet (4 mg total) by mouth every 8 (eight) hours as needed., Disp: 20 tablet, Rfl: 0   pantoprazole  (PROTONIX ) 20 MG tablet, Take 1 tablet (20 mg total) by mouth daily for 14 days., Disp: 14 tablet, Rfl: 0   predniSONE (DELTASONE) 20 MG tablet, Take 1 tablet (20 mg total) by mouth daily with breakfast for 5 days., Disp: 5 tablet, Rfl: 0   tamsulosin  (FLOMAX ) 0.4 MG CAPS capsule, Take 1 capsule (0.4 mg total) by mouth daily., Disp: 90 capsule, Rfl: 3   tiZANidine  (ZANAFLEX ) 4 MG tablet, Take 1 tablet (4 mg total) by mouth every 8 (eight) hours as needed for muscle spasms., Disp: 30 tablet, Rfl: 1   Objective:     Vitals:   10/03/23 0806  BP: 118/82  Pulse: 78  SpO2: 98%  Weight: 166 lb (75.3 kg)  Height: 5' 3 (1.6 m)      Body mass index is 29.41 kg/m.    Physical Exam:    General: Appears well, nad, nontoxic and pleasant Neuro: Decree sensation to 2nd and 3rd digits on right hand compared to left.  Otherwise sensation intact, strength is 5/5 in upper extremities, muscle tone wnl Skin:no susupicious lesions or rashes  Right hand/Wrist:   No deformity or swelling appreciated. ROM  Ext 70, flexion 50, radial/ulnar deviation 20 TTP anterior wrist nttp over the snuff box, dorsal carpals, volar carpals, radial styloid, ulnar styloid, 1st mcp, tfcc Positive Tinel's, Phalen's, Prayer Tests Negative finklestein Neg tfcc bounce test No pain with resisted ext, flex or deviation    Electronically signed by:  Marshall Skeeter D.[facility] 8:31 AM 10/03/23

## 2023-10-03 NOTE — Patient Instructions (Signed)
 Wrist HEP  3-4 week follow up

## 2023-10-04 ENCOUNTER — Ambulatory Visit: Payer: Self-pay | Admitting: Physician Assistant

## 2023-10-06 ENCOUNTER — Other Ambulatory Visit: Payer: Self-pay | Admitting: Emergency Medicine

## 2023-10-06 ENCOUNTER — Other Ambulatory Visit (HOSPITAL_BASED_OUTPATIENT_CLINIC_OR_DEPARTMENT_OTHER): Payer: Self-pay

## 2023-10-06 DIAGNOSIS — M7989 Other specified soft tissue disorders: Secondary | ICD-10-CM

## 2023-10-06 DIAGNOSIS — M25531 Pain in right wrist: Secondary | ICD-10-CM

## 2023-10-06 MED ORDER — PREDNISONE 20 MG PO TABS
20.0000 mg | ORAL_TABLET | Freq: Every day | ORAL | 0 refills | Status: AC
  Filled 2023-10-06: qty 5, 5d supply, fill #0

## 2023-10-06 MED ORDER — HYDROCODONE-ACETAMINOPHEN 10-325 MG PO TABS
1.0000 | ORAL_TABLET | Freq: Three times a day (TID) | ORAL | 0 refills | Status: AC | PRN
Start: 2023-10-06 — End: 2023-10-11
  Filled 2023-10-06: qty 15, 5d supply, fill #0

## 2023-10-09 ENCOUNTER — Telehealth: Payer: Self-pay | Admitting: Sports Medicine

## 2023-10-09 ENCOUNTER — Encounter: Payer: Self-pay | Admitting: Physician Assistant

## 2023-10-09 NOTE — Telephone Encounter (Signed)
 Pt called, had wrist injection here 6/20. Wrist is not better, swollen and painful. Unsure if he need to give it more time or should be seen again?

## 2023-10-09 NOTE — Telephone Encounter (Signed)
Messaged patient via Mychart.

## 2023-10-09 NOTE — Telephone Encounter (Signed)
-----   Message from [facility] K sent at 10/04/2023  9:51 AM EDT ----- [facility] - needs Excision with KP.  He has a 6 month f/u scheduled already.  ----- Message ----- From: Interface, Lab In [facility] Sent: 10/01/2023   5:06 PM EDT To: Erminio MARLA Like, PA-C

## 2023-10-09 NOTE — Telephone Encounter (Signed)
Left message for patient to call office for results/hd 

## 2023-10-09 NOTE — Telephone Encounter (Signed)
 Waiting on message from Dr. Leonce

## 2023-10-14 NOTE — Progress Notes (Unsigned)
    Ben Jackson D.[facility] [address] [place] [address] Phone: [phone]   Assessment and Plan:     There are no diagnoses linked to this encounter.  ***   Pertinent previous records reviewed include ***    Follow Up: ***     Subjective:   I, Teyon Odette, am serving as a Neurosurgeon for Doctor Morene Mace   Chief Complaint: right wrist pain   HPI:    10/03/23 Patient is a 63 year old male with right wrist pain. Patient states pain for years. His arm goes numb. He isnt able to sleep through the night. Was prescribed hydro and prednisone . Does get tingling and numbness. Decreased ROM . Grip strength decrease. When he grabs things he gets an electric shock. Has tried sleeping braces and those dont help  10/15/2023 Patient states   Relevant Historical Information: Hypertension  Additional pertinent review of systems negative.   Current Outpatient Medications:    celecoxib  (CELEBREX ) 200 MG capsule, Take 1 capsule (200 mg total) by mouth 2 (two) times daily between meals as needed., Disp: 60 capsule, Rfl: 1   diclofenac  (VOLTAREN ) 75 MG EC tablet, Take 1 tablet (75 mg) by mouth 2 times daily as needed., Disp: 30 tablet, Rfl: 1   hydrochlorothiazide  (HYDRODIURIL ) 25 MG tablet, Take 1 tablet (25 mg total) by mouth every morning., Disp: 90 tablet, Rfl: 3   ondansetron  (ZOFRAN -ODT) 4 MG disintegrating tablet, Take 1 tablet (4 mg total) by mouth every 8 (eight) hours as needed., Disp: 20 tablet, Rfl: 0   pantoprazole  (PROTONIX ) 20 MG tablet, Take 1 tablet (20 mg total) by mouth daily for 14 days., Disp: 14 tablet, Rfl: 0   tamsulosin  (FLOMAX ) 0.4 MG CAPS capsule, Take 1 capsule (0.4 mg total) by mouth daily., Disp: 90 capsule, Rfl: 3   tiZANidine  (ZANAFLEX ) 4 MG tablet, Take 1 tablet (4 mg total) by mouth every 8 (eight) hours as needed for muscle spasms., Disp: 30 tablet, Rfl: 1   Objective:     There were no vitals filed for this  visit.    There is no height or weight on file to calculate BMI.    Physical Exam:    ***   Electronically signed by:  Odis Mace D.[facility] 4:06 PM 10/14/23

## 2023-10-15 ENCOUNTER — Encounter: Payer: Self-pay | Admitting: Physician Assistant

## 2023-10-15 ENCOUNTER — Other Ambulatory Visit (HOSPITAL_BASED_OUTPATIENT_CLINIC_OR_DEPARTMENT_OTHER): Payer: Self-pay

## 2023-10-15 ENCOUNTER — Ambulatory Visit: Admitting: Sports Medicine

## 2023-10-15 VITALS — BP 114/80 | HR 109 | Ht 63.0 in | Wt 162.8 lb

## 2023-10-15 DIAGNOSIS — M25531 Pain in right wrist: Secondary | ICD-10-CM

## 2023-10-15 DIAGNOSIS — G5601 Carpal tunnel syndrome, right upper limb: Secondary | ICD-10-CM | POA: Diagnosis not present

## 2023-10-15 MED ORDER — METHYLPREDNISOLONE 4 MG PO TBPK
ORAL_TABLET | ORAL | 0 refills | Status: DC
Start: 2023-10-15 — End: 2023-12-11
  Filled 2023-10-15: qty 21, 6d supply, fill #0

## 2023-10-15 MED ORDER — GABAPENTIN 100 MG PO CAPS
100.0000 mg | ORAL_CAPSULE | Freq: Every day | ORAL | 0 refills | Status: DC
  Filled 2023-10-15: qty 30, 30d supply, fill #0

## 2023-10-15 NOTE — Patient Instructions (Signed)
 Prednisone  doe pack. Gabapentin  100 to 300 nightly. Urgent referral to Orthopedic hand surgery. Follow up as needed.

## 2023-10-20 ENCOUNTER — Encounter: Payer: Self-pay | Admitting: Sports Medicine

## 2023-10-20 NOTE — Telephone Encounter (Signed)
Messaged patient via mychart

## 2023-10-21 NOTE — Telephone Encounter (Signed)
 Patient is aware of results and a message has been sent to the schedulers to schedule appointment for excision/hd

## 2023-10-21 NOTE — Telephone Encounter (Signed)
-----   Message from [facility] K sent at 10/04/2023  9:51 AM EDT ----- [facility] - needs Excision with KP.  He has a 6 month f/u scheduled already.  ----- Message ----- From: Interface, Lab In [facility] Sent: 10/01/2023   5:06 PM EDT To: Erminio MARLA Like, PA-C

## 2023-10-22 ENCOUNTER — Other Ambulatory Visit (HOSPITAL_BASED_OUTPATIENT_CLINIC_OR_DEPARTMENT_OTHER): Payer: Self-pay

## 2023-10-22 ENCOUNTER — Ambulatory Visit: Admitting: Sports Medicine

## 2023-10-22 DIAGNOSIS — G5601 Carpal tunnel syndrome, right upper limb: Secondary | ICD-10-CM | POA: Diagnosis not present

## 2023-10-22 DIAGNOSIS — M25531 Pain in right wrist: Secondary | ICD-10-CM | POA: Diagnosis not present

## 2023-10-22 DIAGNOSIS — M65351 Trigger finger, right little finger: Secondary | ICD-10-CM | POA: Insufficient documentation

## 2023-10-22 DIAGNOSIS — M19041 Primary osteoarthritis, right hand: Secondary | ICD-10-CM | POA: Diagnosis not present

## 2023-10-22 MED ORDER — HYDROCODONE-ACETAMINOPHEN 5-325 MG PO TABS
1.0000 | ORAL_TABLET | ORAL | 0 refills | Status: AC | PRN
  Filled 2023-10-22: qty 30, 5d supply, fill #0

## 2023-10-24 DIAGNOSIS — G5601 Carpal tunnel syndrome, right upper limb: Secondary | ICD-10-CM | POA: Diagnosis not present

## 2023-10-30 ENCOUNTER — Other Ambulatory Visit (HOSPITAL_BASED_OUTPATIENT_CLINIC_OR_DEPARTMENT_OTHER): Payer: Self-pay

## 2023-10-30 DIAGNOSIS — M19041 Primary osteoarthritis, right hand: Secondary | ICD-10-CM | POA: Diagnosis not present

## 2023-10-30 DIAGNOSIS — G5601 Carpal tunnel syndrome, right upper limb: Secondary | ICD-10-CM | POA: Diagnosis not present

## 2023-10-30 DIAGNOSIS — M65351 Trigger finger, right little finger: Secondary | ICD-10-CM | POA: Diagnosis not present

## 2023-10-30 MED ORDER — OXYCODONE HCL 5 MG PO TABS
5.0000 mg | ORAL_TABLET | ORAL | 0 refills | Status: AC | PRN
  Filled 2023-10-30: qty 42, 7d supply, fill #0

## 2023-11-03 ENCOUNTER — Other Ambulatory Visit (HOSPITAL_COMMUNITY): Payer: Self-pay

## 2023-11-03 DIAGNOSIS — G5601 Carpal tunnel syndrome, right upper limb: Secondary | ICD-10-CM | POA: Diagnosis not present

## 2023-11-03 DIAGNOSIS — Z4789 Encounter for other orthopedic aftercare: Secondary | ICD-10-CM | POA: Diagnosis not present

## 2023-11-03 MED ORDER — DOXYCYCLINE HYCLATE 100 MG PO CAPS
100.0000 mg | ORAL_CAPSULE | Freq: Two times a day (BID) | ORAL | 0 refills | Status: DC
  Filled 2023-11-03: qty 20, 10d supply, fill #0

## 2023-11-03 MED ORDER — OXYCODONE HCL 5 MG PO TABS
5.0000 mg | ORAL_TABLET | ORAL | 0 refills | Status: DC
  Filled 2023-11-03 – 2023-11-10 (×2): qty 42, 7d supply, fill #0

## 2023-11-07 ENCOUNTER — Other Ambulatory Visit (HOSPITAL_COMMUNITY): Payer: Self-pay

## 2023-11-07 ENCOUNTER — Other Ambulatory Visit: Payer: Self-pay | Admitting: Emergency Medicine

## 2023-11-07 MED ORDER — HYDROCHLOROTHIAZIDE 25 MG PO TABS
25.0000 mg | ORAL_TABLET | Freq: Every morning | ORAL | 3 refills | Status: DC
  Filled 2023-11-07 – 2023-11-14 (×2): qty 90, 90d supply, fill #0

## 2023-11-10 ENCOUNTER — Other Ambulatory Visit (HOSPITAL_COMMUNITY): Payer: Self-pay

## 2023-11-14 ENCOUNTER — Other Ambulatory Visit (HOSPITAL_BASED_OUTPATIENT_CLINIC_OR_DEPARTMENT_OTHER): Payer: Self-pay

## 2023-11-17 ENCOUNTER — Other Ambulatory Visit (HOSPITAL_BASED_OUTPATIENT_CLINIC_OR_DEPARTMENT_OTHER): Payer: Self-pay

## 2023-11-17 DIAGNOSIS — M79641 Pain in right hand: Secondary | ICD-10-CM | POA: Diagnosis not present

## 2023-11-28 ENCOUNTER — Other Ambulatory Visit: Payer: Self-pay | Admitting: Radiology

## 2023-11-28 ENCOUNTER — Other Ambulatory Visit (HOSPITAL_BASED_OUTPATIENT_CLINIC_OR_DEPARTMENT_OTHER): Payer: Self-pay

## 2023-11-28 MED ORDER — ESCITALOPRAM OXALATE 10 MG PO TABS
10.0000 mg | ORAL_TABLET | Freq: Every day | ORAL | 1 refills | Status: DC
  Filled 2023-11-28: qty 90, 90d supply, fill #0
  Filled 2024-02-08 – 2024-03-22 (×2): qty 90, 90d supply, fill #1

## 2023-12-01 ENCOUNTER — Other Ambulatory Visit (HOSPITAL_COMMUNITY): Payer: Self-pay

## 2023-12-01 ENCOUNTER — Other Ambulatory Visit (HOSPITAL_BASED_OUTPATIENT_CLINIC_OR_DEPARTMENT_OTHER): Payer: Self-pay

## 2023-12-01 MED ORDER — PREDNISONE 10 MG PO TABS: ORAL_TABLET | ORAL | 0 refills | Status: DC

## 2023-12-01 MED ORDER — PREDNISONE 10 MG PO TABS
ORAL_TABLET | ORAL | 0 refills | Status: DC
  Filled 2023-12-01 (×2): qty 42, 21d supply, fill #0

## 2023-12-03 ENCOUNTER — Encounter: Admitting: Emergency Medicine

## 2023-12-11 ENCOUNTER — Ambulatory Visit (INDEPENDENT_AMBULATORY_CARE_PROVIDER_SITE_OTHER): Admitting: Family Medicine

## 2023-12-11 ENCOUNTER — Other Ambulatory Visit (HOSPITAL_BASED_OUTPATIENT_CLINIC_OR_DEPARTMENT_OTHER): Payer: Self-pay

## 2023-12-11 ENCOUNTER — Encounter: Admitting: Emergency Medicine

## 2023-12-11 ENCOUNTER — Other Ambulatory Visit: Payer: Self-pay | Admitting: Medical Genetics

## 2023-12-11 ENCOUNTER — Other Ambulatory Visit: Payer: Self-pay

## 2023-12-11 ENCOUNTER — Encounter: Payer: Self-pay | Admitting: Family Medicine

## 2023-12-11 VITALS — BP 136/82 | HR 75 | Temp 98.1°F | Ht 63.0 in | Wt 173.6 lb

## 2023-12-11 DIAGNOSIS — Z79899 Other long term (current) drug therapy: Secondary | ICD-10-CM | POA: Insufficient documentation

## 2023-12-11 DIAGNOSIS — R911 Solitary pulmonary nodule: Secondary | ICD-10-CM

## 2023-12-11 DIAGNOSIS — I10 Essential (primary) hypertension: Secondary | ICD-10-CM

## 2023-12-11 DIAGNOSIS — K760 Fatty (change of) liver, not elsewhere classified: Secondary | ICD-10-CM

## 2023-12-11 DIAGNOSIS — R7989 Other specified abnormal findings of blood chemistry: Secondary | ICD-10-CM | POA: Insufficient documentation

## 2023-12-11 DIAGNOSIS — Z0000 Encounter for general adult medical examination without abnormal findings: Secondary | ICD-10-CM | POA: Insufficient documentation

## 2023-12-11 DIAGNOSIS — R399 Unspecified symptoms and signs involving the genitourinary system: Secondary | ICD-10-CM | POA: Diagnosis not present

## 2023-12-11 DIAGNOSIS — E507 Other ocular manifestations of vitamin A deficiency: Secondary | ICD-10-CM | POA: Diagnosis not present

## 2023-12-11 DIAGNOSIS — Z23 Encounter for immunization: Secondary | ICD-10-CM | POA: Insufficient documentation

## 2023-12-11 DIAGNOSIS — F411 Generalized anxiety disorder: Secondary | ICD-10-CM | POA: Insufficient documentation

## 2023-12-11 DIAGNOSIS — R7309 Other abnormal glucose: Secondary | ICD-10-CM | POA: Insufficient documentation

## 2023-12-11 DIAGNOSIS — M79641 Pain in right hand: Secondary | ICD-10-CM | POA: Insufficient documentation

## 2023-12-11 DIAGNOSIS — D170 Benign lipomatous neoplasm of skin and subcutaneous tissue of head, face and neck: Secondary | ICD-10-CM | POA: Insufficient documentation

## 2023-12-11 DIAGNOSIS — E871 Hypo-osmolality and hyponatremia: Secondary | ICD-10-CM | POA: Insufficient documentation

## 2023-12-11 MED ORDER — LOSARTAN POTASSIUM 50 MG PO TABS
50.0000 mg | ORAL_TABLET | Freq: Every day | ORAL | 3 refills | Status: AC
  Filled 2023-12-11: qty 90, 90d supply, fill #0
  Filled 2024-02-08 – 2024-03-22 (×2): qty 90, 90d supply, fill #1
  Filled 2024-04-29 – 2024-05-05 (×2): qty 90, 90d supply, fill #2
  Filled ????-??-??: fill #2

## 2023-12-11 MED ORDER — HYDROCHLOROTHIAZIDE 25 MG PO TABS
25.0000 mg | ORAL_TABLET | Freq: Every morning | ORAL | 3 refills | Status: AC
  Filled 2023-12-11 – 2024-03-22 (×3): qty 90, 90d supply, fill #0
  Filled 2024-04-29 – 2024-05-05 (×2): qty 90, 90d supply, fill #1
  Filled ????-??-??: fill #1

## 2023-12-11 MED ORDER — VISTA GONIO DRY EYE RELIEF 2.5 % OP SOLN
1.0000 [drp] | Freq: Three times a day (TID) | OPHTHALMIC | 1 refills | Status: DC | PRN
  Filled 2023-12-11: qty 15, 30d supply, fill #0

## 2023-12-11 MED ORDER — TAMSULOSIN HCL 0.4 MG PO CAPS
0.4000 mg | ORAL_CAPSULE | Freq: Every day | ORAL | 3 refills | Status: AC
  Filled 2023-12-11: qty 90, 90d supply, fill #0
  Filled 2024-02-08 – 2024-03-22 (×2): qty 90, 90d supply, fill #1

## 2023-12-11 NOTE — Patient Instructions (Addendum)
 We have completed your physical today.  Continue current medication regimen.   I have sent in refills of your medications today.  We have given your 2nd shingles vaccine today.  We have given your pneumonia vaccine today.  Come back in for fasting labs about 2-3 weeks after completing your prednisone  course.   Follow up with hand surgeon as scheduled.   Follow-up with me in 6 mos for medication management, sooner if needed.

## 2023-12-11 NOTE — Progress Notes (Signed)
 Annual Physical Exam  Subjective:     Patient ID: Perry Zamora, male    DOB: 02-14-1961, 63 y.o.   MRN: [id number]  Chief Complaint  Patient presents with   Annual Exam    HPI  Discussed the use of AI scribe software for clinical note transcription with the patient, who gave verbal consent to proceed.  History of Present Illness Perry Zamora is a 63 year old male with hypertension who presents for medication refills and evaluation of urinary symptoms.  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  Right hand numbness and pain - Right hand completely numb for the past three days - Pain at the knuckle, difficulty bending the hand - On prednisone  for right hand symptoms, resulting in weight gain of ten pounds and increased appetite - History of surgery on July 21st for chronic arm numbness - Follow-up appointment scheduled at the end of September  Prostatic symptoms and history - History of prostate issues with previous symptoms resolved after fistula surgery - No further evaluation pursued after prostate doctor retired, despite prior recommendation for biopsy - Fluctuating PSA levels  Hypertension and medication management - Requires refills for losartan  (missing from medication list) and hydrochlorothiazide  (referred to as 'water  pill')  Ophthalmic medication management - Requires refill for eye drops  Anxiety and depression - Takes Lexapro  every other day for anxiety and depression - Lexapro  helps manage anger and hyperactivity  Pulmonary nodule surveillance - History of pulmonary nodule  Immunization status - Received the first two COVID vaccines - Considering further vaccinations     ROS Per HPI  Most recent  fall risk assessment:    10/02/2023    3:21 PM  Fall Risk   Falls in the past year? 0  Number falls in past yr: 0  Injury with Fall? 0  Risk for fall due to : No Fall Risks  Follow up Falls evaluation completed    Most recent depression screenings:    10/02/2023    3:21 PM 12/09/2022    8:43 AM  PHQ 2/9 Scores  PHQ - 2 Score 0 0    Vision:Within last year and Dental: No current dental problems and Receives regular dental care  Patient Care Team: Purcell Emil Schanz, MD as PCP - General (Internal Medicine) Shona Layman JAYSON, MD as Referring Physician (Urology) Sheldon Standing, MD as Consulting Physician (General Surgery) [place] GAILS, DO as Consulting Physician (Gastroenterology)   Outpatient Medications Prior to Visit  Medication Sig   celecoxib  (CELEBREX ) 200 MG capsule Take 1 capsule (200 mg total) by mouth 2 (two) times daily between meals as needed.   diclofenac  (VOLTAREN ) 75 MG EC tablet Take 1 tablet (75 mg) by mouth 2 times daily as needed.   escitalopram  (LEXAPRO ) 10 MG tablet Take 1 tablet (10 mg) by mouth daily.   ondansetron  (ZOFRAN -ODT) 4 MG disintegrating tablet Take 1 tablet (4 mg total) by mouth every 8 (eight) hours as needed.   oxyCODONE  (OXY IR/ROXICODONE ) 5 MG immediate release tablet Take 1 tablet (5 mg total) by mouth every 4-6 hours as needed for pain   pantoprazole  (PROTONIX ) 20 MG tablet Take 1 tablet (20 mg total) by mouth daily for 14 days.   predniSONE  (DELTASONE ) 10 MG tablet Take 3  tablets (30 mg total) by mouth daily for 7 days, THEN 2 tablets (20 mg total) daily for 7 days, THEN 1 tablet (10 mg total) daily for 7 days.   tiZANidine  (ZANAFLEX ) 4 MG tablet Take 1 tablet (4 mg total) by mouth every 8 (eight) hours as needed for muscle spasms.   [DISCONTINUED] doxycycline  (VIBRAMYCIN ) 100 MG capsule Take 1 capsule (100 mg total) by mouth 2 (two) times daily.   [DISCONTINUED] gabapentin  (NEURONTIN ) 100 MG capsule Take 1 capsule (100 mg total) by  mouth at bedtime.   [DISCONTINUED] hydrochlorothiazide  (HYDRODIURIL ) 25 MG tablet Take 1 tablet (25 mg total) by mouth every morning.   [DISCONTINUED] methylPREDNISolone  (MEDROL  DOSEPAK) 4 MG TBPK tablet Follow instructions on the package.   [DISCONTINUED] predniSONE  (DELTASONE ) 10 MG tablet Take 3 tabs per day for 1 week, then take 2 tabs a day for a week, then take 1 tab daily for 1 week   [DISCONTINUED] tamsulosin  (FLOMAX ) 0.4 MG CAPS capsule Take 1 capsule (0.4 mg total) by mouth daily.   No facility-administered medications prior to visit.       Objective:    BP 136/82 (BP Location: Left Arm, Patient Position: Sitting)   Pulse 75   Temp 98.1 F (36.7 C) (Temporal)   Ht 5' 3 (1.6 m)   Wt 173 lb 9.6 oz (78.7 kg)   SpO2 97%   BMI 30.75 kg/m    Physical Exam Vitals and nursing note reviewed.  Constitutional:      General: He is not in acute distress.    Appearance: Normal appearance.  HENT:     Head: Normocephalic and atraumatic.      Comments: Raised area, no erythema, no bruising, non tender, consistent with lipoma    Right Ear: External ear normal.     Left Ear: External ear normal.     Nose: Nose normal.     Mouth/Throat:     Mouth: Mucous membranes are moist.     Pharynx: Oropharynx is clear.  Eyes:     Extraocular Movements: Extraocular movements intact.  Cardiovascular:     Rate and Rhythm: Normal rate and regular rhythm.     Pulses: Normal pulses.     Heart sounds: Normal heart sounds.  Pulmonary:     Effort: Pulmonary effort is normal. No respiratory distress.     Breath sounds: Normal breath sounds. No wheezing, rhonchi or rales.  Abdominal:     General: Bowel sounds are normal. There is no distension.     Palpations: Abdomen is soft. There is no mass.     Tenderness: There is no abdominal tenderness. There is no guarding or rebound.     Hernia: No hernia is present.  Genitourinary:    Comments: Deferred  Musculoskeletal:     Cervical back: Normal  range of motion.     Right lower leg: No edema.     Left lower leg: No edema.     Comments: R hand with LROM to little finger, decrease sensation to R thumb, R 1st and 2nd fingers, surgical scar to R wrist  Lymphadenopathy:     Cervical: No cervical adenopathy.  Skin:    General: Skin is warm and dry.  Neurological:     General: No focal deficit present.     Mental Status: He is alert and oriented to person, place, and time.  Psychiatric:        Mood and Affect: Mood normal.        Behavior:  Behavior normal.     No results found for any visits on 12/11/23.  BP Readings from Last 3 Encounters:  12/11/23 136/82  10/15/23 114/80  10/03/23 118/82   Wt Readings from Last 3 Encounters:  12/11/23 173 lb 9.6 oz (78.7 kg)  10/15/23 162 lb 12.8 oz (73.8 kg)  10/03/23 166 lb (75.3 kg)      Last CBC Lab Results  Component Value Date   WBC 5.7 09/17/2023   HGB 14.9 09/17/2023   HCT 41.3 09/17/2023   MCV 85.9 09/17/2023   MCH 31.0 09/17/2023   RDW 14.1 09/17/2023   PLT 226 09/17/2023   Last metabolic panel Lab Results  Component Value Date   GLUCOSE 137 (H) 09/17/2023   NA 131 (L) 09/17/2023   K 3.5 09/17/2023   CL 91 (L) 09/17/2023   CO2 23 09/17/2023   BUN 18 09/17/2023   CREATININE 0.89 09/17/2023   GFRNONAA >60 09/17/2023   CALCIUM  9.1 09/17/2023   PROT 7.7 09/17/2023   ALBUMIN 4.4 09/17/2023   LABGLOB 3.1 09/15/2023   AGRATIO 1.6 04/26/2020   BILITOT 1.5 (H) 09/17/2023   ALKPHOS 119 09/17/2023   AST 310 (H) 09/17/2023   ALT 640 (H) 09/17/2023   ANIONGAP 17 (H) 09/17/2023   Last lipids Lab Results  Component Value Date   CHOL 185 12/09/2022   HDL 50.80 12/09/2022   LDLCALC 109 (H) 12/09/2022   LDLDIRECT 44.0 09/24/2022   TRIG 128.0 12/09/2022   CHOLHDL 4 12/09/2022   Last hemoglobin A1c Lab Results  Component Value Date   HGBA1C 5.6 06/16/2023   Last thyroid functions No results found for: TSH, T3TOTAL, T4TOTAL, THYROIDAB Last vitamin  D No results found for: 25OHVITD2, 25OHVITD3, VD25OH Last vitamin B12 and Folate No results found for: VITAMINB12, FOLATE       Assessment & Plan:   Assessment and Plan Assessment & Plan Adult Wellness Visit/ Immunizations Discussed weight gain and increased appetite due to prednisone . Addressed medication impact on urinary frequency and overall health. - Administer second Shingrix  vaccine. - Administer pneumonia vaccine. - Please get your flu vaccine next week once they are available - Schedule blood work two weeks after completion of prednisone .  Essential hypertension Hypertension managed with losartan  and hydrochlorothiazide . Losartan  missing from medication list. - Refill losartan  prescription. - Refill hydrochlorothiazide  prescription.  Benign prostatic hyperplasia with lower urinary tract symptoms Increased urinary frequency possibly due to medications. PSA levels fluctuated, biopsy considered if PSA elevated - Check PSA level. - Consider prostate biopsy if PSA is still elevated  Right Hand Pain, right hand surgery Post-surgical numbness, pain, and difficulty bending knuckle. On prednisone  for inflammation with no significant improvement.  Generalized anxiety disorder Anxiety managed with escitalopram  every other day, preventing escalation of symptoms.  Hyponatremia Risk due to hydrochlorothiazide  and increased urination. Discussed medication impact on sodium levels.  Abnormal glucose Potential elevated glucose due to prednisone . Advised to avoid sweets and recheck glucose post-prednisone . - Schedule blood work two weeks after completion of prednisone .  Xerophthalmia - Stable, refilled eye drops  Hepatosteatosis/ Elevated LFTs - Stable - CMP today  Benign lipoma of head/neck skin Benign lipoma present, cosmetically concerning but not painful. - Refer to dermatology for evaluation and potential removal if it becomes bothersome.     Health  Maintenance  Topic Date Due   COVID-19 Vaccine (1) Never done   Flu Shot  11/14/2023   Colon Cancer Screening  03/31/2033   Pneumococcal Vaccine for age over 74  Completed  Hepatitis C Screening  Completed   HIV Screening  Completed   Zoster (Shingles) Vaccine  Completed   Hepatitis B Vaccine  Aged Out   HPV Vaccine  Aged Out   Meningitis B Vaccine  Aged Out   DTaP/Tdap/Td vaccine  Discontinued     Discussed health benefits of physical activity, and encouraged him to engage in regular exercise appropriate for his age and condition.  Orders Placed This Encounter  Procedures   Zoster, Recombinant (Shingrix )   Pneumococcal conjugate vaccine 20-valent (Prevnar 20)   CBC with Differential/Platelet    Standing Status:   Future    Expiration Date:   12/10/2024    Release to patient:   Immediate [1]   Comprehensive metabolic panel with GFR    Standing Status:   Future    Expiration Date:   12/10/2024    Release to patient:   Immediate [1]   Hemoglobin A1c    Standing Status:   Future    Expiration Date:   12/10/2024   Lipid panel    Standing Status:   Future    Expiration Date:   12/10/2024   PSA    Standing Status:   Future    Expiration Date:   12/10/2024   Vitamin B12    Standing Status:   Future    Expiration Date:   12/10/2024   VITAMIN D 25 Hydroxy (Vit-D Deficiency, Fractures)    Standing Status:   Future    Expiration Date:   12/10/2024     Meds ordered this encounter  Medications   tamsulosin  (FLOMAX ) 0.4 MG CAPS capsule    Sig: Take 1 capsule (0.4 mg total) by mouth daily.    Dispense:  90 capsule    Refill:  3   losartan  (COZAAR ) 50 MG tablet    Sig: Take 1 tablet (50 mg total) by mouth daily.    Dispense:  90 tablet    Refill:  3   Hypromellose (V[place] DRY EYE RELIEF) 2.5 % SOLN    Sig: Apply 1 drop to eye 3 (three) times daily as needed.    Dispense:  15 mL    Refill:  1   hydrochlorothiazide  (HYDRODIURIL ) 25 MG tablet    Sig: Take 1 tablet (25 mg  total) by mouth every morning.    Dispense:  90 tablet    Refill:  3    Return in about 6 months (around 06/12/2024) for meds/labs.  Corean LITTIE Ku, FNP

## 2023-12-16 ENCOUNTER — Other Ambulatory Visit (HOSPITAL_BASED_OUTPATIENT_CLINIC_OR_DEPARTMENT_OTHER): Payer: Self-pay

## 2023-12-22 ENCOUNTER — Encounter: Payer: Self-pay | Admitting: Dermatology

## 2023-12-25 ENCOUNTER — Encounter: Payer: Self-pay | Admitting: Dermatology

## 2023-12-25 ENCOUNTER — Telehealth: Payer: Self-pay

## 2023-12-25 ENCOUNTER — Other Ambulatory Visit (HOSPITAL_BASED_OUTPATIENT_CLINIC_OR_DEPARTMENT_OTHER): Payer: Self-pay

## 2023-12-25 ENCOUNTER — Other Ambulatory Visit (HOSPITAL_COMMUNITY): Payer: Self-pay

## 2023-12-25 ENCOUNTER — Ambulatory Visit: Admitting: Dermatology

## 2023-12-25 VITALS — BP 116/77 | HR 90 | Temp 98.1°F

## 2023-12-25 DIAGNOSIS — C4491 Basal cell carcinoma of skin, unspecified: Secondary | ICD-10-CM

## 2023-12-25 DIAGNOSIS — C44612 Basal cell carcinoma of skin of right upper limb, including shoulder: Secondary | ICD-10-CM | POA: Diagnosis not present

## 2023-12-25 DIAGNOSIS — M47816 Spondylosis without myelopathy or radiculopathy, lumbar region: Secondary | ICD-10-CM

## 2023-12-25 DIAGNOSIS — M65351 Trigger finger, right little finger: Secondary | ICD-10-CM | POA: Diagnosis not present

## 2023-12-25 MED ORDER — TRAMADOL HCL 50 MG PO TABS
50.0000 mg | ORAL_TABLET | Freq: Four times a day (QID) | ORAL | 0 refills | Status: DC | PRN
  Filled 2023-12-25 (×2): qty 8, 2d supply, fill #0

## 2023-12-25 NOTE — Progress Notes (Unsigned)
   Follow-Up Visit   Subjective  Perry Zamora is a 64 y.o. male who presents for the following: Excision of right anterior shoulder  The following portions of the chart were reviewed this encounter and updated as appropriate: medications, allergies, medical history  Review of Systems:  No other skin or systemic complaints except as noted in HPI or Assessment and Plan.  Objective  Well appearing patient in no apparent distress; mood and affect are within normal limits.  A focused examination was performed of the following areas: Right anterior shoulder Relevant physical exam findings are noted in the Assessment and Plan.   right anterior shoulder Pink pearly papule or plaque with arborizing vessels.    Assessment & Plan   BASAL CELL CARCINOMA (BCC), UNSPECIFIED SITE right anterior shoulder Skin excision  Excision method:  elliptical Lesion length (cm):  1.5 Lesion width (cm):  1.1 Margin per side (cm):  0.5 Total excision diameter (cm):  2.5 Informed consent: discussed and consent obtained   Timeout: patient name, date of birth, surgical site, and procedure verified   Procedure prep:  Patient was prepped and draped in usual sterile fashion Prep type:  Chlorhexidine  Anesthesia: the lesion was anesthetized in a standard fashion   Anesthetic:  1% lidocaine  w/ epinephrine  1-100,000 buffered w/ 8.4% NaHCO3 Instrument used: #15 blade   Hemostasis achieved with: suture, pressure and electrodesiccation   Outcome: patient tolerated procedure well with no complications   Post-procedure details: sterile dressing applied and wound care instructions given   Dressing type: bandage and pressure dressing    Skin repair Complexity:  Complex Final length (cm):  5.1 Informed consent: discussed and consent obtained   Timeout: patient name, date of birth, surgical site, and procedure verified   Procedure prep:  Patient was prepped and draped in usual sterile fashion Prep type:   Chlorhexidine  Anesthesia: the lesion was anesthetized in a standard fashion   Anesthetic:  1% lidocaine  w/ epinephrine  1-100,000 buffered w/ 8.4% NaHCO3 Reason for type of repair: reduce tension to allow closure and preserve normal anatomy   Undermining: edges undermined   Subcutaneous layers (deep stitches):  Suture size:  3-0 Suture type: PDS (polydioxanone)   Stitches:  Buried vertical mattress Fine/surface layer approximation (top stitches):  Suture type: cyanoacrylate tissue glue   Hemostasis achieved with: suture, pressure and electrodesiccation Outcome: patient tolerated procedure well with no complications   Post-procedure details: sterile dressing applied and wound care instructions given   Dressing type: bandage and pressure dressing    Specimen 1 - Surgical pathology Differential Diagnosis: BCC  [phone] Check Margins: No Related Medications traMADol  (ULTRAM ) 50 MG tablet Take 1 tablet (50 mg total) by mouth every 6 (six) hours as needed for up to 8 doses.   No follow-ups on file.  I, Berwyn Lesches, Surg Tech III, am acting as scribe for RUFUS CHRISTELLA HOLY, MD.   Documentation: I have reviewed the above documentation for accuracy and completeness, and I agree with the above.  RUFUS CHRISTELLA HOLY, MD

## 2023-12-25 NOTE — Patient Instructions (Signed)

## 2023-12-25 NOTE — Telephone Encounter (Signed)
 Last injection 09/22/23 % 100 relief and function ability Duration of relief/improvement--2.5 months No current falls or injuries Current pain score-3 Same location and same pain as last time.

## 2023-12-25 NOTE — Addendum Note (Signed)
 Addended by: ELDONNA GARDINER POUR on: 12/25/2023 09:54 AM   Modules accepted: Orders

## 2023-12-26 DIAGNOSIS — M65949 Unspecified synovitis and tenosynovitis, unspecified hand: Secondary | ICD-10-CM | POA: Insufficient documentation

## 2023-12-26 LAB — SURGICAL PATHOLOGY

## 2023-12-29 ENCOUNTER — Ambulatory Visit: Payer: Self-pay | Admitting: Dermatology

## 2024-01-05 ENCOUNTER — Ambulatory Visit: Admitting: Dermatology

## 2024-01-22 ENCOUNTER — Other Ambulatory Visit: Payer: Self-pay

## 2024-01-22 ENCOUNTER — Ambulatory Visit (INDEPENDENT_AMBULATORY_CARE_PROVIDER_SITE_OTHER): Admitting: Physical Medicine and Rehabilitation

## 2024-01-22 VITALS — BP 117/77 | HR 105

## 2024-01-22 DIAGNOSIS — M47816 Spondylosis without myelopathy or radiculopathy, lumbar region: Secondary | ICD-10-CM | POA: Diagnosis not present

## 2024-01-22 DIAGNOSIS — M5416 Radiculopathy, lumbar region: Secondary | ICD-10-CM

## 2024-01-22 MED ORDER — METHYLPREDNISOLONE ACETATE 80 MG/ML IJ SUSP
40.0000 mg | Freq: Once | INTRAMUSCULAR | Status: AC
  Administered 2024-01-22: 40 mg

## 2024-01-22 NOTE — Progress Notes (Signed)
 Pain Scale   Average Pain 5 Patient advising he has chronic lower back pain radiating to right hip area , pain is constant.        +Driver, -BT, -Dye Allergies.

## 2024-02-02 NOTE — Procedures (Signed)
 Lumbar Facet Joint Intra-Articular Injection(s) with Fluoroscopic Guidance  Patient: Perry Zamora      Date of Birth: [birth year] MRN: [id number] PCP: Purcell Emil Schanz, MD      Visit Date: 01/22/2024   Universal Protocol:    Date/Time: 01/22/2024  Consent Given By: the patient  Position: PRONE   Additional Comments: Vital signs were monitored before and after the procedure. Patient was prepped and draped in the usual sterile fashion. The correct patient, procedure, and site was verified.   Injection Procedure Details:  Procedure Site One Meds Administered:  Meds ordered this encounter  Medications   methylPREDNISolone  acetate (DEPO-MEDROL ) injection 40 mg     Laterality: Right  Location/Site:  L5-S1  Needle size: 22 guage  Needle type: Spinal  Needle Placement: Articular  Findings:  -Comments: Excellent flow of contrast producing a partial arthrogram.  Procedure Details: The fluoroscope beam is vertically oriented in AP, and the inferior recess is visualized beneath the lower pole of the inferior apophyseal process, which represents the target point for needle insertion. When direct visualization is difficult the target point is located at the medial projection of the vertebral pedicle. The region overlying each aforementioned target is locally anesthetized with a 1 to 2 ml. volume of 1% Lidocaine  without Epinephrine .   The spinal needle was inserted into each of the above mentioned facet joints using biplanar fluoroscopic guidance. A 0.25 to 0.5 ml. volume of Isovue-250 was injected and a partial facet joint arthrogram was obtained. A single spot film was obtained of the resulting arthrogram.    One to 1.25 ml of the steroid/anesthetic solution was then injected into each of the facet joints noted above.   Additional Comments:  The patient tolerated the procedure well Dressing: 2 x 2 sterile gauze and Band-Aid    Post-procedure details: Patient was observed  during the procedure. Post-procedure instructions were reviewed.  Patient left the clinic in stable condition.

## 2024-02-02 NOTE — Progress Notes (Signed)
 Ricco Dershem Ozturk - 63 y.o. male MRN [id number]  Date of birth: Oct 17, 1960  Office Visit Note: Visit Date: 01/22/2024 PCP: Purcell Emil Schanz, MD Referred by: Purcell Emil Schanz, *  Subjective: Chief Complaint  Patient presents with   Lower Back - Pain   HPI:  DVONTAE RUAN is a 63 y.o. male who comes in today at the request of Duwaine Pouch, FNP for planned Right  L5-S1 Lumbar facet/medial branch block with fluoroscopic guidance.  The patient has failed conservative care including home exercise, medications, time and activity modification.  This injection will be diagnostic and hopefully therapeutic.  Please see requesting physician notes for further details and justification.  Exam has shown concordant pain with facet joint loading.   ROS Otherwise per HPI.  Assessment & Plan: Visit Diagnoses:    ICD-10-CM   1. Spondylosis without myelopathy or radiculopathy, lumbar region  M47.816 XR C-ARM NO REPORT    Epidural Steroid injection    methylPREDNISolone  acetate (DEPO-MEDROL ) injection 40 mg    2. Lumbar radiculopathy  M54.16       Plan: No additional findings.   Meds & Orders:  Meds ordered this encounter  Medications   methylPREDNISolone  acetate (DEPO-MEDROL ) injection 40 mg    Orders Placed This Encounter  Procedures   XR C-ARM NO REPORT   Epidural Steroid injection    Follow-up: Return for visit to requesting provider as needed.   Procedures: No procedures performed  Lumbar Facet Joint Intra-Articular Injection(s) with Fluoroscopic Guidance  Patient: CADYN FANN      Date of Birth: [birth year] MRN: [id number] PCP: Purcell Emil Schanz, MD      Visit Date: 01/22/2024   Universal Protocol:    Date/Time: 01/22/2024  Consent Given By: the patient  Position: PRONE   Additional Comments: Vital signs were monitored before and after the procedure. Patient was prepped and draped in the usual sterile fashion. The correct patient, procedure, and site was  verified.   Injection Procedure Details:  Procedure Site One Meds Administered:  Meds ordered this encounter  Medications   methylPREDNISolone  acetate (DEPO-MEDROL ) injection 40 mg     Laterality: Right  Location/Site:  L5-S1  Needle size: 22 guage  Needle type: Spinal  Needle Placement: Articular  Findings:  -Comments: Excellent flow of contrast producing a partial arthrogram.  Procedure Details: The fluoroscope beam is vertically oriented in AP, and the inferior recess is visualized beneath the lower pole of the inferior apophyseal process, which represents the target point for needle insertion. When direct visualization is difficult the target point is located at the medial projection of the vertebral pedicle. The region overlying each aforementioned target is locally anesthetized with a 1 to 2 ml. volume of 1% Lidocaine  without Epinephrine .   The spinal needle was inserted into each of the above mentioned facet joints using biplanar fluoroscopic guidance. A 0.25 to 0.5 ml. volume of Isovue-250 was injected and a partial facet joint arthrogram was obtained. A single spot film was obtained of the resulting arthrogram.    One to 1.25 ml of the steroid/anesthetic solution was then injected into each of the facet joints noted above.   Additional Comments:  The patient tolerated the procedure well Dressing: 2 x 2 sterile gauze and Band-Aid    Post-procedure details: Patient was observed during the procedure. Post-procedure instructions were reviewed.  Patient left the clinic in stable condition.     Clinical History: CLINICAL DATA:  Low back pain radiating down bilateral legs first  2 years   EXAM: MRI LUMBAR SPINE WITHOUT CONTRAST   TECHNIQUE: Multiplanar, multisequence MR imaging of the lumbar spine was performed. No intravenous contrast was administered.   COMPARISON:  None Available.   FINDINGS: Segmentation:  Standard.   Alignment:  Physiologic.    Vertebrae: No acute fracture, evidence of discitis, or aggressive bone lesion.   Conus medullaris and cauda equina: Conus extends to the L1 level. Conus and cauda equina appear normal.   Paraspinal and other soft tissues: No acute paraspinal abnormality.   Disc levels:   Disc spaces: Mild disc height loss at L2-3 and L3-4.   T12-L1: 0   L1-L2: No significant disc bulge. No neural foraminal stenosis. No central canal stenosis.   L2-L3: Mild broad-based disc bulge. Mild bilateral facet arthropathy. No foraminal or central canal stenosis.   L3-L4: Mild broad-based disc bulge. Mild bilateral facet arthropathy. No foraminal or central canal stenosis.   L4-L5: Mild broad-based disc bulge. Moderate bilateral facet arthropathy with a small left facet effusion. Mild spinal stenosis. No foraminal stenosis.   L5-S1: Mild broad-based disc bulge. Severe right and mild left facet arthropathy. Mild right foraminal stenosis. No left foraminal stenosis. No spinal stenosis.   IMPRESSION: 1. Lumbar spine spondylosis as described above. 2. No acute osseous injury of the lumbar spine.     Electronically Signed   By: Julaine Blanch M.D.   On: 10/07/2022 09:27     Objective:  VS:  HT:    WT:   BMI:     BP:117/77  HR:(!) 105bpm  TEMP: ( )  RESP:  Physical Exam Vitals and nursing note reviewed.  Constitutional:      General: He is not in acute distress.    Appearance: Normal appearance. He is not ill-appearing.  HENT:     Head: Normocephalic and atraumatic.     Right Ear: External ear normal.     Left Ear: External ear normal.     Nose: No congestion.  Eyes:     Extraocular Movements: Extraocular movements intact.  Cardiovascular:     Rate and Rhythm: Normal rate.     Pulses: Normal pulses.  Pulmonary:     Effort: Pulmonary effort is normal. No respiratory distress.  Abdominal:     General: There is no distension.     Palpations: Abdomen is soft.  Musculoskeletal:         General: No tenderness or signs of injury.     Cervical back: Neck supple.     Right lower leg: No edema.     Left lower leg: No edema.     Comments: Patient has good distal strength without clonus.  Skin:    Findings: No erythema or rash.  Neurological:     General: No focal deficit present.     Mental Status: He is alert and oriented to person, place, and time.     Sensory: No sensory deficit.     Motor: No weakness or abnormal muscle tone.     Coordination: Coordination normal.  Psychiatric:        Mood and Affect: Mood normal.        Behavior: Behavior normal.      Imaging: No results found.

## 2024-02-04 ENCOUNTER — Encounter: Payer: Self-pay | Admitting: Physician Assistant

## 2024-02-04 ENCOUNTER — Ambulatory Visit: Admitting: Physician Assistant

## 2024-02-04 ENCOUNTER — Other Ambulatory Visit (HOSPITAL_COMMUNITY): Payer: Self-pay

## 2024-02-04 VITALS — BP 137/89 | HR 103

## 2024-02-04 DIAGNOSIS — Z85828 Personal history of other malignant neoplasm of skin: Secondary | ICD-10-CM | POA: Diagnosis not present

## 2024-02-04 DIAGNOSIS — L814 Other melanin hyperpigmentation: Secondary | ICD-10-CM

## 2024-02-04 DIAGNOSIS — M65942 Unspecified synovitis and tenosynovitis, left hand: Secondary | ICD-10-CM | POA: Diagnosis not present

## 2024-02-04 DIAGNOSIS — L821 Other seborrheic keratosis: Secondary | ICD-10-CM

## 2024-02-04 DIAGNOSIS — L578 Other skin changes due to chronic exposure to nonionizing radiation: Secondary | ICD-10-CM

## 2024-02-04 DIAGNOSIS — Z1283 Encounter for screening for malignant neoplasm of skin: Secondary | ICD-10-CM

## 2024-02-04 DIAGNOSIS — W908XXA Exposure to other nonionizing radiation, initial encounter: Secondary | ICD-10-CM | POA: Diagnosis not present

## 2024-02-04 DIAGNOSIS — Z4789 Encounter for other orthopedic aftercare: Secondary | ICD-10-CM | POA: Diagnosis not present

## 2024-02-04 DIAGNOSIS — D225 Melanocytic nevi of trunk: Secondary | ICD-10-CM

## 2024-02-04 DIAGNOSIS — D1801 Hemangioma of skin and subcutaneous tissue: Secondary | ICD-10-CM

## 2024-02-04 DIAGNOSIS — D229 Melanocytic nevi, unspecified: Secondary | ICD-10-CM

## 2024-02-04 DIAGNOSIS — M65931 Unspecified synovitis and tenosynovitis, right forearm: Secondary | ICD-10-CM | POA: Diagnosis not present

## 2024-02-04 DIAGNOSIS — M65351 Trigger finger, right little finger: Secondary | ICD-10-CM | POA: Diagnosis not present

## 2024-02-04 MED ORDER — CELECOXIB 200 MG PO CAPS
200.0000 mg | ORAL_CAPSULE | Freq: Every day | ORAL | 1 refills | Status: DC | PRN
  Filled 2024-02-04: qty 30, 30d supply, fill #0
  Filled 2024-02-08: qty 30, 30d supply, fill #1

## 2024-02-04 NOTE — Patient Instructions (Signed)

## 2024-02-04 NOTE — Progress Notes (Signed)
 Total Body Skin Exam (TBSE) Visit   Subjective  Perry Zamora is a 63 y.o. male who presents for the following: Skin Cancer Screening and Full Body Skin Exam  Patient presents today for follow up visit for TBSE. Patient was last evaluated on 09/29/23 by Erminio and 12/25/23 by Dr. Corey for excision of a BCC. Patient denies medication changes. Patient reports he  does not have spots, moles and lesions of concern to be evaluated. Patient reports throughout his lifetime he  has had moderate sun exposure. Currently, patient reports if he  has excessive sun exposure, he  does not apply sunscreen and/or wears protective coverings. Patient reports he  has hx of bx. Patient denies  family history of skin cancers. The patient has spots, moles and lesions to be evaluated, some may be new or changing and the patient has concerns that these could be cancer.  The following portions of the chart were reviewed this encounter and updated as appropriate: medications, allergies, medical history  Review of Systems:  No other skin or systemic complaints except as noted in HPI or Assessment and Plan.  Objective  Well appearing patient in no apparent distress; mood and affect are within normal limits.  A full examination was performed including scalp, head, eyes, ears, nose, lips, neck, chest, axillae, abdomen, back, buttocks, bilateral upper extremities, bilateral lower extremities, hands, feet, fingers, toes, fingernails, and toenails. All findings within normal limits unless otherwise noted below.   Relevant physical exam findings are noted in the Assessment and Plan.  Right Lower Back 1 cm irregular brown papule   Assessment & Plan   LENTIGINES, SEBORRHEIC KERATOSES, HEMANGIOMAS - Benign normal skin lesions - Benign-appearing - Call for any changes  MELANOCYTIC NEVI - Tan-brown and/or pink-flesh-colored symmetric macules and papules - Benign appearing on exam today - Observation - Call clinic for new  or changing moles - Recommend daily use of broad spectrum spf 30+ sunscreen to sun-exposed areas.   ACTINIC DAMAGE - Chronic condition, secondary to cumulative UV/sun exposure - diffuse scaly erythematous macules with underlying dyspigmentation - Recommend daily broad spectrum sunscreen SPF 30+ to sun-exposed areas, reapply every 2 hours as needed.  - Staying in the shade or wearing long sleeves, sun glasses (UVA+UVB protection) and wide brim hats (4-inch brim around the entire circumference of the hat) are also recommended for sun protection.  - Call for new or changing lesions.  SKIN CANCER SCREENING PERFORMED TODAY.  HISTORY OF BASAL CELL CARCINOMA OF THE SKIN - No evidence of recurrence today - Recommend regular full body skin exams - Recommend daily broad spectrum sunscreen SPF 30+ to sun-exposed areas, reapply every 2 hours as needed.  - Call if any new or changing lesions are noted between office visits    IRRITATED NEVUS Right Lower Back Epidermal / dermal shaving - Right Lower Back  Lesion diameter (cm):  1 Informed consent: discussed and consent obtained   Timeout: patient name, date of birth, surgical site, and procedure verified   Procedure prep:  Patient was prepped and draped in usual sterile fashion Prep type:  Isopropyl alcohol Anesthesia: the lesion was anesthetized in a standard fashion   Anesthetic:  1% lidocaine  w/ epinephrine  1-100,000 buffered w/ 8.4% NaHCO3 Instrument used: flexible razor blade   Hemostasis achieved with: pressure, aluminum chloride and electrodesiccation   Outcome: patient tolerated procedure well   Post-procedure details: sterile dressing applied and wound care instructions given   Dressing type: bandage and petrolatum  Specimen 1 - Surgical pathology Differential Diagnosis: irritated nevus  Check Margins: No HISTORY OF BASAL CELL CANCER   LENTIGINES   SCREENING EXAM FOR SKIN CANCER   MULTIPLE PIGMENTED NEVI   CHERRY  ANGIOMA   SEBORRHEIC KERATOSIS   ACTINIC SKIN DAMAGE   Return in about 1 year (around 02/03/2025) for TBSE follow up.  I, Doyce Pan, CMA, am acting as scribe for Eevee Borbon K, PA-C.   Documentation: I have reviewed the above documentation for accuracy and completeness, and I agree with the above.  Jentry Warnell K, PA-C

## 2024-02-06 ENCOUNTER — Other Ambulatory Visit: Payer: Self-pay | Admitting: Medical Genetics

## 2024-02-06 DIAGNOSIS — Z006 Encounter for examination for normal comparison and control in clinical research program: Secondary | ICD-10-CM

## 2024-02-06 LAB — SURGICAL PATHOLOGY

## 2024-02-07 ENCOUNTER — Ambulatory Visit: Payer: Self-pay | Admitting: Physician Assistant

## 2024-02-08 ENCOUNTER — Encounter: Payer: Self-pay | Admitting: Physician Assistant

## 2024-02-09 ENCOUNTER — Other Ambulatory Visit: Payer: Self-pay

## 2024-02-09 ENCOUNTER — Other Ambulatory Visit (HOSPITAL_BASED_OUTPATIENT_CLINIC_OR_DEPARTMENT_OTHER): Payer: Self-pay

## 2024-02-16 ENCOUNTER — Encounter: Payer: Self-pay | Admitting: Radiology

## 2024-02-19 ENCOUNTER — Other Ambulatory Visit (HOSPITAL_BASED_OUTPATIENT_CLINIC_OR_DEPARTMENT_OTHER): Payer: Self-pay

## 2024-02-25 ENCOUNTER — Other Ambulatory Visit (HOSPITAL_COMMUNITY): Payer: Self-pay

## 2024-02-25 ENCOUNTER — Ambulatory Visit: Admitting: Urology

## 2024-02-25 DIAGNOSIS — G5601 Carpal tunnel syndrome, right upper limb: Secondary | ICD-10-CM | POA: Diagnosis not present

## 2024-02-25 DIAGNOSIS — M65931 Unspecified synovitis and tenosynovitis, right forearm: Secondary | ICD-10-CM | POA: Diagnosis not present

## 2024-02-25 DIAGNOSIS — M65351 Trigger finger, right little finger: Secondary | ICD-10-CM | POA: Diagnosis not present

## 2024-02-25 DIAGNOSIS — M79641 Pain in right hand: Secondary | ICD-10-CM | POA: Diagnosis not present

## 2024-02-25 DIAGNOSIS — M25531 Pain in right wrist: Secondary | ICD-10-CM | POA: Diagnosis not present

## 2024-02-25 MED ORDER — OXYCODONE HCL 5 MG PO TABS
5.0000 mg | ORAL_TABLET | ORAL | 0 refills | Status: DC | PRN
  Filled 2024-02-25 (×2): qty 42, 7d supply, fill #0

## 2024-02-27 DIAGNOSIS — M79641 Pain in right hand: Secondary | ICD-10-CM | POA: Diagnosis not present

## 2024-02-27 DIAGNOSIS — M25531 Pain in right wrist: Secondary | ICD-10-CM | POA: Diagnosis not present

## 2024-03-01 ENCOUNTER — Other Ambulatory Visit: Payer: Self-pay

## 2024-03-01 ENCOUNTER — Encounter (HOSPITAL_COMMUNITY): Payer: Self-pay | Admitting: Orthopedic Surgery

## 2024-03-01 NOTE — Progress Notes (Signed)
 PCP - Dr. Emil Schaumann Cardiologist - denies  PPM/ICD - denies   Chest x-ray - denies EKG - 06/16/23 Stress Test - denies ECHO - 03/25/20 Cardiac Cath - denies  CPAP - denies  DM- denies  ASA/Blood Thinner Instructions: n/a   ERAS Protcol - clears until 1200  COVID TEST- n/a  Anesthesia review: no  Patient verbally denies any shortness of breath, fever, cough and chest pain during phone call      Questions were answered. Patient verbalized understanding of instructions.

## 2024-03-01 NOTE — Pre-Procedure Instructions (Addendum)
-------------    SDW INSTRUCTIONS given:  Your procedure is scheduled on 11/18.  Report to [facility]tr[facility] A at 12:30 P.M., and check in at the Admitting office.  Any questions or running late day of surgery: call [phone]    Remember:  Do not eat after midnight the night before your surgery  You may drink clear liquids until 12:00 PM the day of your surgery.   Clear liquids allowed are: Water , Non-Citrus Juices (without pulp), Carbonated Beverages, Clear Tea, Black Coffee Only, and Gatorade    Take these medicines the morning of surgery with A SIP OF WATER   escitalopram  (LEXAPRO )  tamsulosin  (FLOMAX )     May take these medicines IF NEEDED: Hypromellose (VISTA GONIO DRY EYE RELIEF)  ondansetron  (ZOFRAN -ODT)   As of today, STOP taking any Aspirin  (unless otherwise instructed by your surgeon) Aleve, diclofenac  (VOLTAREN ), Naproxen, diclofenac  (VOLTAREN ),  celecoxib  (CELEBREX ), Ibuprofen, Motrin, Advil, Goody's, BC's, all herbal medications, fish oil, and all vitamins.   Do NOT Smoke (Tobacco/Vaping) 24 hours prior to your procedure  If you use a CPAP at night, you may bring all equipment for your overnight stay.     You will be asked to remove any contacts, glasses, piercing's, hearing aid's, dentures/partials prior to surgery. Please bring cases for these items if needed.     Patients discharged the day of surgery will not be allowed to drive home, and someone needs to stay with them for 24 hours.  SURGICAL WAITING ROOM VISITATION Patients may have no more than 2 support people in the waiting area - these visitors may rotate.   Pre-op nurse will coordinate an appropriate time for 1 ADULT support person, who may not rotate, to accompany patient in pre-op.  Children under the age of 18 must have an adult with them who is not the patient and must remain in the main waiting area with an adult.  If the patient needs to stay at the hospital during part of their  recovery, the visitor guidelines for inpatient rooms apply.  Please refer to the [facility] website for the visitor guidelines for any additional information.   Special instructions:   Please follow these instructions carefully.   Shower the NIGHT BEFORE SURGERY and the MORNING OF SURGERY with DIAL Soap.   Pat yourself dry with a CLEAN TOWEL.  Wear CLEAN PAJAMAS to bed the night before surgery  Place CLEAN SHEETS on your bed the night of your first shower and DO NOT SLEEP WITH PETS.   Additional instructions for the day of surgery: DO NOT APPLY any lotions, deodorants, cologne, or perfumes.   Do not wear jewelry or makeup Do not wear nail polish, gel polish, artificial nails, or any other type of covering on natural nails (fingers and toes) Do not bring valuables to the hospital. [facility] is not responsible for valuables/personal belongings. Put on clean/comfortable clothes.  Please brush your teeth.  Ask your nurse before applying any prescription medications to the skin.    Questions were answered. Patient verbalized understanding of instructions.

## 2024-03-02 ENCOUNTER — Other Ambulatory Visit: Payer: Self-pay

## 2024-03-02 ENCOUNTER — Ambulatory Visit (HOSPITAL_COMMUNITY)
Admission: RE | Admit: 2024-03-02 | Discharge: 2024-03-02 | Disposition: A | Discharge status: Home / Self Care | Attending: Orthopedic Surgery | Admitting: Orthopedic Surgery

## 2024-03-02 ENCOUNTER — Encounter (HOSPITAL_COMMUNITY)
Admission: RE | Disposition: A | Discharge status: Home / Self Care | Payer: Self-pay | Source: Home / Self Care | Attending: Orthopedic Surgery

## 2024-03-02 ENCOUNTER — Ambulatory Visit (HOSPITAL_COMMUNITY): Payer: Self-pay | Admitting: Anesthesiology

## 2024-03-02 ENCOUNTER — Encounter (HOSPITAL_COMMUNITY): Payer: Self-pay | Admitting: Orthopedic Surgery

## 2024-03-02 ENCOUNTER — Other Ambulatory Visit (HOSPITAL_COMMUNITY): Payer: Self-pay

## 2024-03-02 DIAGNOSIS — R7689 Other specified abnormal immunological findings in serum: Secondary | ICD-10-CM | POA: Insufficient documentation

## 2024-03-02 DIAGNOSIS — I10 Essential (primary) hypertension: Secondary | ICD-10-CM | POA: Insufficient documentation

## 2024-03-02 DIAGNOSIS — M65941 Unspecified synovitis and tenosynovitis, right hand: Secondary | ICD-10-CM | POA: Diagnosis not present

## 2024-03-02 DIAGNOSIS — M65821 Other synovitis and tenosynovitis, right upper arm: Secondary | ICD-10-CM | POA: Diagnosis not present

## 2024-03-02 DIAGNOSIS — M65841 Other synovitis and tenosynovitis, right hand: Secondary | ICD-10-CM | POA: Insufficient documentation

## 2024-03-02 HISTORY — PX: TENOSYNOVECTOMY: SHX6110

## 2024-03-02 LAB — CBC
HCT: 39.2 % (ref 39.0–52.0)
Hemoglobin: 13.8 g/dL (ref 13.0–17.0)
MCH: 32.3 pg (ref 26.0–34.0)
MCHC: 35.2 g/dL (ref 30.0–36.0)
MCV: 91.8 fL (ref 80.0–100.0)
Platelets: 248 K/uL (ref 150–400)
RBC: 4.27 MIL/uL (ref 4.22–5.81)
RDW: 12.8 % (ref 11.5–15.5)
WBC: 8 K/uL (ref 4.0–10.5)
nRBC: 0 % (ref 0.0–0.2)

## 2024-03-02 LAB — BASIC METABOLIC PANEL WITH GFR
Anion gap: 17 — ABNORMAL HIGH (ref 5–15)
BUN: 15 mg/dL (ref 8–23)
CO2: 23 mmol/L (ref 22–32)
Calcium: 9.3 mg/dL (ref 8.9–10.3)
Chloride: 89 mmol/L — ABNORMAL LOW (ref 98–111)
Creatinine, Ser: 0.6 mg/dL — ABNORMAL LOW (ref 0.61–1.24)
GFR, Estimated: >60 mL/min (ref >60)
Glucose, Bld: 100 mg/dL — ABNORMAL HIGH (ref 70–99)
Potassium: 3.3 mmol/L — ABNORMAL LOW (ref 3.5–5.1)
Sodium: 129 mmol/L — ABNORMAL LOW (ref 135–145)

## 2024-03-02 SURGERY — TENOSYNOVECTOMY
Anesthesia: Monitor Anesthesia Care | Laterality: Right

## 2024-03-02 MED ORDER — ONDANSETRON HCL 4 MG/2ML IJ SOLN
INTRAMUSCULAR | Status: AC
  Filled 2024-03-02: qty 2

## 2024-03-02 MED ORDER — MIDAZOLAM HCL 2 MG/2ML IJ SOLN
INTRAMUSCULAR | Status: AC
  Administered 2024-03-02: 2 mg via INTRAVENOUS
  Filled 2024-03-02: qty 2

## 2024-03-02 MED ORDER — OXYCODONE HCL 5 MG PO TABS
5.0000 mg | ORAL_TABLET | ORAL | 0 refills | Status: DC | PRN
  Filled 2024-03-02 – 2024-03-03 (×2): qty 42, 7d supply, fill #0

## 2024-03-02 MED ORDER — DOXYCYCLINE HYCLATE 100 MG PO CAPS
100.0000 mg | ORAL_CAPSULE | Freq: Two times a day (BID) | ORAL | 0 refills | Status: DC
  Filled 2024-03-02 – 2024-03-03 (×2): qty 28, 14d supply, fill #0

## 2024-03-02 MED ORDER — CHLORHEXIDINE GLUCONATE 0.12 % MT SOLN: 15.0000 mL | Freq: Once | OROMUCOSAL | Status: AC

## 2024-03-02 MED ORDER — ORAL CARE MOUTH RINSE: 15.0000 mL | Freq: Once | OROMUCOSAL | Status: AC

## 2024-03-02 MED ORDER — MIDAZOLAM HCL 2 MG/2ML IJ SOLN
INTRAMUSCULAR | Status: AC
  Filled 2024-03-02: qty 2

## 2024-03-02 MED ORDER — FENTANYL CITRATE (PF) 100 MCG/2ML IJ SOLN: 25.0000 ug | INTRAMUSCULAR | Status: DC | PRN

## 2024-03-02 MED ORDER — PHENYLEPHRINE HCL-NACL 20-0.9 MG/250ML-% IV SOLN
INTRAVENOUS | Status: DC | PRN
  Administered 2024-03-02: 20 ug/min via INTRAVENOUS

## 2024-03-02 MED ORDER — ACETAMINOPHEN 500 MG PO TABS
1000.0000 mg | ORAL_TABLET | Freq: Once | ORAL | Status: AC
  Administered 2024-03-02: 1000 mg via ORAL
  Filled 2024-03-02: qty 2

## 2024-03-02 MED ORDER — MIDAZOLAM HCL (PF) 2 MG/2ML IJ SOLN: 2.0000 mg | Freq: Once | INTRAMUSCULAR | Status: AC

## 2024-03-02 MED ORDER — ONDANSETRON HCL 4 MG/2ML IJ SOLN: 4.0000 mg | Freq: Once | INTRAMUSCULAR | Status: DC | PRN

## 2024-03-02 MED ORDER — ONDANSETRON HCL 4 MG/2ML IJ SOLN
INTRAMUSCULAR | Status: DC | PRN
  Administered 2024-03-02: 4 mg via INTRAVENOUS

## 2024-03-02 MED ORDER — AMISULPRIDE (ANTIEMETIC) 5 MG/2ML IV SOLN: 10.0000 mg | Freq: Once | INTRAVENOUS | Status: DC | PRN

## 2024-03-02 MED ORDER — MIDAZOLAM HCL 5 MG/5ML IJ SOLN
INTRAMUSCULAR | Status: DC | PRN
Start: 2024-03-02 — End: 2024-03-02
  Administered 2024-03-02: 1 mg via INTRAVENOUS

## 2024-03-02 MED ORDER — ONDANSETRON HCL 4 MG PO TABS
4.0000 mg | ORAL_TABLET | Freq: Three times a day (TID) | ORAL | 1 refills | Status: DC | PRN
  Filled 2024-03-02 – 2024-03-03 (×2): qty 15, 5d supply, fill #0

## 2024-03-02 MED ORDER — FENTANYL CITRATE (PF) 100 MCG/2ML IJ SOLN: 50.0000 ug | Freq: Once | INTRAMUSCULAR | Status: AC

## 2024-03-02 MED ORDER — BUPIVACAINE-EPINEPHRINE (PF) 0.5% -1:200000 IJ SOLN
INTRAMUSCULAR | Status: DC | PRN
  Administered 2024-03-02: 30 mL via PERINEURAL

## 2024-03-02 MED ORDER — CHLORHEXIDINE GLUCONATE 0.12 % MT SOLN
OROMUCOSAL | Status: AC
  Administered 2024-03-02: 15 mL via OROMUCOSAL
  Filled 2024-03-02: qty 15

## 2024-03-02 MED ORDER — SODIUM CHLORIDE 0.9 % IR SOLN
Status: DC | PRN
  Administered 2024-03-02: 1000 mL
  Administered 2024-03-02: 3000 mL

## 2024-03-02 MED ORDER — CEFAZOLIN SODIUM-DEXTROSE 2-3 GM-%(50ML) IV SOLR
INTRAVENOUS | Status: DC | PRN
  Administered 2024-03-02: 2 g via INTRAVENOUS

## 2024-03-02 MED ORDER — FENTANYL CITRATE (PF) 100 MCG/2ML IJ SOLN
INTRAMUSCULAR | Status: AC
  Administered 2024-03-02: 50 ug via INTRAVENOUS
  Filled 2024-03-02: qty 2

## 2024-03-02 MED ORDER — PROPOFOL 500 MG/50ML IV EMUL
INTRAVENOUS | Status: DC | PRN
Start: 2024-03-02 — End: 2024-03-02
  Administered 2024-03-02: 125 ug/kg/min via INTRAVENOUS

## 2024-03-02 MED ORDER — METHOCARBAMOL 500 MG PO TABS
500.0000 mg | ORAL_TABLET | Freq: Four times a day (QID) | ORAL | 0 refills | Status: DC | PRN
  Filled 2024-03-02 – 2024-03-03 (×2): qty 40, 10d supply, fill #0

## 2024-03-02 MED ORDER — LACTATED RINGERS IV SOLN: INTRAVENOUS | Status: DC

## 2024-03-02 MED ORDER — PROPOFOL 10 MG/ML IV BOLUS
INTRAVENOUS | Status: DC | PRN
  Administered 2024-03-02 (×3): 20 mg via INTRAVENOUS

## 2024-03-02 SURGICAL SUPPLY — 48 items
BAG COUNTER SPONGE SURGICOUNT (BAG) ×1 IMPLANT
BNDG COHESIVE 1X5 TAN STRL LF (GAUZE/BANDAGES/DRESSINGS) IMPLANT
BNDG ELASTIC 3INX 5YD STR LF (GAUZE/BANDAGES/DRESSINGS) IMPLANT
BNDG ELASTIC 4INX 5YD STR LF (GAUZE/BANDAGES/DRESSINGS) IMPLANT
BNDG ELASTIC 4X5.8 VLCR STR LF (GAUZE/BANDAGES/DRESSINGS) IMPLANT
BNDG GAUZE DERMACEA FLUFF 4 (GAUZE/BANDAGES/DRESSINGS) ×4 IMPLANT
CORD BIPOLAR FORCEPS 12FT (ELECTRODE) ×1 IMPLANT
COVER SURGICAL LIGHT HANDLE (MISCELLANEOUS) ×1 IMPLANT
CUFF TOURN SGL QUICK 18X4 (TOURNIQUET CUFF) ×1 IMPLANT
CUFF TRNQT CYL 24X4X16.5-23 (TOURNIQUET CUFF) IMPLANT
DRAIN PENROSE 0.25X18 (DRAIN) IMPLANT
DRAPE SURG 17X23 STRL (DRAPES) ×1 IMPLANT
DRSG ADAPTIC 3X8 NADH LF (GAUZE/BANDAGES/DRESSINGS) IMPLANT
GAUZE SPONGE 2X2 8PLY STRL LF (GAUZE/BANDAGES/DRESSINGS) IMPLANT
GAUZE SPONGE 4X4 12PLY STRL (GAUZE/BANDAGES/DRESSINGS) IMPLANT
GAUZE XEROFORM 1X8 LF (GAUZE/BANDAGES/DRESSINGS) IMPLANT
GAUZE XEROFORM 5X9 LF (GAUZE/BANDAGES/DRESSINGS) IMPLANT
GLOVE BIOGEL M 8.0 STRL (GLOVE) ×1 IMPLANT
GLOVE SS BIOGEL STRL SZ 8 (GLOVE) ×2 IMPLANT
GOWN STRL REUS W/ TWL LRG LVL3 (GOWN DISPOSABLE) ×4 IMPLANT
GOWN STRL REUS W/ TWL XL LVL3 (GOWN DISPOSABLE) ×3 IMPLANT
KIT BASIN OR (CUSTOM PROCEDURE TRAY) ×1 IMPLANT
KIT TURNOVER KIT B (KITS) ×2 IMPLANT
LOOP VASCLR MAXI BLUE 18IN ST (MISCELLANEOUS) IMPLANT
MANIFOLD NEPTUNE II (INSTRUMENTS) ×1 IMPLANT
NDL HYPO 25GX1X1/2 BEV (NEEDLE) IMPLANT
NEEDLE HYPO 25GX1X1/2 BEV (NEEDLE) IMPLANT
PACK ORTHO EXTREMITY (CUSTOM PROCEDURE TRAY) ×2 IMPLANT
PAD ARMBOARD POSITIONER FOAM (MISCELLANEOUS) ×2 IMPLANT
PAD CAST 4YDX4 CTTN HI CHSV (CAST SUPPLIES) IMPLANT
PAD CAST CTTN 4X4 STRL (SOFTGOODS) IMPLANT
PADDING CAST ABS COTTON 3X4 (CAST SUPPLIES) IMPLANT
SET CYSTO IRRIGATION (SET/KITS/TRAYS/PACK) IMPLANT
SOL PREP POV-IOD 4OZ 10% (MISCELLANEOUS) ×4 IMPLANT
SOLN 0.9% NACL POUR BTL 1000ML (IV SOLUTION) ×2 IMPLANT
SOLN STERILE WATER BTL 1000 ML (IV SOLUTION) ×1 IMPLANT
SPEAR EYE SURG WECK-CEL (MISCELLANEOUS) IMPLANT
SPIKE FLUID TRANSFER (MISCELLANEOUS) ×2 IMPLANT
SPLINT FIBERGLASS 4X30 (CAST SUPPLIES) IMPLANT
SUT MERSILENE 4 0 P 3 (SUTURE) IMPLANT
SUT PROLENE 4 0 P 3 18 (SUTURE) IMPLANT
SUT PROLENE 4 0 PS 2 18 (SUTURE) IMPLANT
SUT VIC AB 2-0 CT1 TAPERPNT 27 (SUTURE) IMPLANT
SYR CONTROL 10ML LL (SYRINGE) IMPLANT
TOWEL GREEN STERILE (TOWEL DISPOSABLE) ×1 IMPLANT
TOWEL GREEN STERILE FF (TOWEL DISPOSABLE) ×1 IMPLANT
TUBE CONNECTING 12X1/4 (SUCTIONS) IMPLANT
UNDERPAD 30X36 HEAVY ABSORB (UNDERPADS AND DIAPERS) ×2 IMPLANT

## 2024-03-02 NOTE — Transfer of Care (Signed)
 Immediate Anesthesia Transfer of Care Note  Patient: Perry Zamora  Procedure(s) Performed: TENOSYNOVECTOMY (Right)  Patient Location: PACU  Anesthesia Type:MAC and Regional  Level of Consciousness: awake, alert , and oriented  Airway & Oxygen Therapy: Patient Spontanous Breathing  Post-op Assessment: Report given to RN and Post -op Vital signs reviewed and stable  Post vital signs: Reviewed and stable  Last Vitals:  Vitals Value Taken Time  BP 117/97 03/02/24 17:06  Temp 36.4 C 03/02/24 17:06  Pulse 88 03/02/24 17:11  Resp 16 03/02/24 17:11  SpO2 97 % 03/02/24 17:11  Vitals shown include unfiled device data.  Last Pain:  Vitals:   03/02/24 1315  TempSrc:   PainSc: 10-Worst pain ever      Patients Stated Pain Goal: 4 (03/02/24 1315)  Complications: No notable events documented.

## 2024-03-02 NOTE — H&P (Signed)
 Perry Zamora is an 63 y.o. male.   Chief Complaint: The patient presents for tendon synovectomy extensive in nature hand and wrist about the right upper extremity HPI: Patient presents for evaluation and treatment of the of their upper extremity predicament. The patient denies neck, back, chest or  abdominal pain. The patient notes that they have no lower extremity problems. The patients primary complaint is noted. We are planning surgical care pathway for the upper extremity.  Patient has had numerous episodes of inflammation.  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  Laboratory analysis does not show any obvious infection and his rheumatoid factor is negative.  He has a positive ANA and has signs of chronic inflammation.    Past Medical History:  Diagnosis Date   Basal cell carcinoma 09/29/2023   Right ant shoulder - needs excision   HTN (hypertension)     Past Surgical History:  Procedure Laterality Date   BASAL CELL CARCINOMA EXCISION     FLEXIBLE SIGMOIDOSCOPY N/A 06/25/2023   Procedure: KINGSTON SIDE;  Surgeon: Sheldon Standing, MD;  Location: [facility];  Service: General;  Laterality: N/A;    Family History  Problem Relation Age of Onset   Colon cancer Neg Hx    Colon polyps Neg Hx    Esophageal cancer Neg Hx    Rectal cancer Neg Hx    Stomach cancer Neg Hx    Social History:  reports that he has never smoked. He has never used smokeless tobacco. He reports that he does not currently use alcohol after a past usage of about 3.0 standard drinks of alcohol per week. He reports that he does not use drugs.  Allergies:  Allergies  Allergen Reactions   Gabapentin  Other (See Comments)    Crazy dreams    Medications Prior to Admission  Medication Sig Dispense Refill   hydrochlorothiazide  (HYDRODIURIL ) 25 MG tablet Take 1 tablet (25 mg total) by mouth every morning.  90 tablet 3   Hypromellose (V[place] DRY EYE RELIEF) 2.5 % SOLN Apply 1 drop to eye 3 (three) times daily as needed. 15 mL 1   losartan  (COZAAR ) 50 MG tablet Take 1 tablet (50 mg total) by mouth daily. 90 tablet 3   celecoxib  (CELEBREX ) 200 MG capsule Take 1 capsule (200 mg total) by mouth 2 (two) times daily between meals as needed. 60 capsule 1   diclofenac  (VOLTAREN ) 75 MG EC tablet Take 1 tablet (75 mg) by mouth 2 times daily as needed. 30 tablet 1   escitalopram  (LEXAPRO ) 10 MG tablet Take 1 tablet (10 mg) by mouth daily. 90 tablet 1   ondansetron  (ZOFRAN -ODT) 4 MG disintegrating tablet Take 1 tablet (4 mg total) by mouth every 8 (eight) hours as needed. 20 tablet 0   oxyCODONE  (OXY IR/ROXICODONE ) 5 MG immediate release tablet Take 1 tablet (5 mg total) by mouth every 4-6 hours as needed for pain 42 tablet 0   oxyCODONE  (OXY IR/ROXICODONE ) 5 MG immediate release tablet Take 1 tablet (5 mg total) by mouth every 4 (four) to 6 (six) hours as needed for pain for 7 days. 42 tablet 0   pantoprazole  (PROTONIX ) 20 MG tablet Take 1 tablet (20 mg total) by mouth daily for 14 days. 14 tablet 0   tamsulosin  (FLOMAX ) 0.4 MG CAPS capsule Take 1 capsule (0.4 mg total) by mouth daily. 90 capsule 3   tiZANidine  (ZANAFLEX ) 4 MG  tablet Take 1 tablet (4 mg total) by mouth every 8 (eight) hours as needed for muscle spasms. 30 tablet 1   traMADol  (ULTRAM ) 50 MG tablet Take 1 tablet (50 mg total) by mouth every 6 (six) hours as needed for up to 8 doses. 8 tablet 0    Results for orders placed or performed during the hospital encounter of 03/02/24 (from the past 48 hours)  CBC per protocol     Status: None   Collection Time: 03/02/24  1:10 PM  Result Value Ref Range   WBC 8.0 4.0 - 10.5 K/uL   RBC 4.27 4.22 - 5.81 MIL/uL   Hemoglobin 13.8 13.0 - 17.0 g/dL   HCT 60.7 60.9 - 47.9 %   MCV 91.8 80.0 - 100.0 fL   MCH 32.3 26.0 - 34.0 pg   MCHC 35.2 30.0 - 36.0 g/dL   RDW 87.1 88.4 - 84.4 %   Platelets 248 150  - 400 K/uL   nRBC 0.0 0.0 - 0.2 %    Comment: Performed at [facility], [address], [place], [place] [address]   No results found.  Review of Systems  Blood pressure 128/86, pulse 83, temperature 98.8 F (37.1 C), temperature source Oral, resp. rate 18, height 5' 3 (1.6 m), weight 72.6 kg, SpO2 99%. Physical Exam advanced tenosynovitis right hand and wrist.  No signs of infection or dystrophy no signs of vascular compromise.  We will plan to proceed with radical tenosynovectomy.  I have reviewed his labs and MRI.  I reviewed his operative extremity which is normal.  Lower extremity examination is stable.  We discussed all issues with the patient and his wife.  He notes no systemic symptoms at present time.  I performed a thoughtful review and discussion with the patient and his family.  The patient is alert and oriented in no acute distress. The patient complains of pain in the affected upper extremity.  The patient is noted to have a normal HEENT exam. Lung fields show equal chest expansion and no shortness of breath. Abdomen exam is nontender without distention. Lower extremity examination does not show any fracture dislocation or blood clot symptoms. Pelvis is stable and the neck and back are stable and nontender.   Assessment/Plan We will plan to proceed with right wrist and hand radical tenosynovectomy with repair reconstruction as necessary and biopsy.  He understands all issues.  I have discussed this with the patient and his family and reviewed his labs and MRI and studies at great length.  We are planning surgery for your upper extremity. The risk and benefits of surgery to include risk of bleeding, infection, anesthesia,  damage to normal structures and failure of the surgery to accomplish its intended goals of relieving symptoms and restoring function have been discussed in detail. With this in mind we plan to proceed. I have specifically discussed with the patient  the pre-and postoperative regime and the dos and don'ts and risk and benefits in great detail. Risk and benefits of surgery also include risk of dystrophy(CRPS), chronic nerve pain, failure of the healing process to go onto completion and other inherent risks of surgery The relavent the pathophysiology of the disease/injury process, as well as the alternatives for treatment and postoperative course of action has been discussed in great detail with the patient who desires to proceed.  We will do everything in our power to help you (the patient) restore function to the upper extremity. It is a pleasure  to see this patient today.   Elsie CHRISTELLA Mussel III, MD 03/02/2024, 1:43 PM

## 2024-03-02 NOTE — Op Note (Signed)
 Operative note March 02, 2024  Date of dictation and operation March 02, 2024  Perry Zamora  Preoperative diagnosis extensive tenosynovitis right hand wrist and forearm with exuberant tenosynovium notable and chronic pain  Postop diagnosis the same  Procedure #1 extensive small finger FDP and FDS (flexor digitorum superficialis and flexor digitorum profundus tendon synovectomy extensive in nature #2 extensive/radical tenosynovectomy volar wrist and hand flexor digitorum profundus and flexor digitorum superficialis to the index ring middle and small fingers and flexor pollicis longus to the thumb #3 median nerve neurolysis #4 tenosynovial biopsy right wrist and hand #5 extensive superficial palmar arch dissection/ulnar artery and radial artery dissection extensive in nature  Surgeon Perry Zamora  Anesthesia Block with IV sedation  Tourniquet time less than 2 hours  Cultures taken x 2 with tissue culture sent for atypical Mycobacterium --pathology sent for- Congo red stain rule out amyloid disease and for routine pathology  Operative findings: This patient had extensive tenosynovium about the FPL FDP and FDS tendons.  This extended into the index finger FDP and to the small finger FDP FDS.  A radical tenosynovectomy was performed.  The patient has normal sed rate and C-reactive protein as well as normal rheumatoid factor and CCP.  My inclination/thoughts are this is either a seronegative inflammatory arthritis sequela a or a atypical Mycobacterium infection given the interoperative findings.  Will await culture results and pathology.  Operative procedure detail: Patient was seen by myself and anesthesia he was given a block.  He was counseled extensively by myself with his wife present.  He was taken to the operative theater and underwent Hibiclens  scrub by myself followed by Betadine scrub by myself followed by sterile field being secured.  Once this was accomplished the patient then  underwent elevation of the tourniquet and timeout was observed.  A utility area incision was made.  This was from the base of the MP crease of the small finger zigzagging in a modified Bruner fashion across the wrist crease and up into the volar forearm.  This allowed for skin flap elevation off of the fascia.  Once this was performed I then incised the fascia and noted a exuberant amount of tenosynovitis.  The amount of tenosynovitis and abnormality cannot be overestimated.  There is no meaningful anatomy and thus at this time we very carefully and cautiously performed a dissection and evaluation of the median nerve proximally vessel loop was placed around this and it was dissected distally into the palm taking care to avoid injury to the superficial palmar arch which was extensively dissected as was the radial artery and ulnar artery.  Following this I then guided up the FPL followed by the FDS of the index middle ring and small fingers and the FDP to the index middle ring and small fingers.  In doing so I stripped of the tenosynovium and disease off of the area and radically removed any diseased tissue including film off of the volar floor.  I then dissected into the small finger and the index finger.  I did take portions of the sheath in these regions but did not encroach upon the tendons in terms of injuring their integrity.  I did window between the superficial palmar arch and the arterial structures and did identify the median nerve distally as well as the ulnar nerve and its tributaries to the fourth webspace.  Patient tolerated this well.  I sent cultures for regular pathology as well as amyloid staining with Congo red.  I sent  cultures for atypical mycobacteria which is suspect in this case as well as aerobic anaerobic and fungal cultures.  There is an extensive dissection of the structures.  At this time I then deflated the tourniquet refill looked excellent irrigation with 4 L of saline.  I placed a  Hemovac drain and following this performed a closure with Prolene.  Refill was excellent hemostasis was secured.  Thus the patient underwent median nerve neurolysis extensive in nature.  The patient underwent radical tenosynovectomy about the forearm, wrist and hand including the small finger and the index finger.  These were done separately and all tendons including the 9 tendons in the carpal tunnel were extensively cleaned and swept clear of the diseased tissue.  The volar floor was competent there is no exposed carpal anatomy.  There were no complications.  The patient was closed without difficulty will he will have a drain in and I will have the send until Wednesday.  The patient and I have discussed all issues plans and concerns.  This can be a difficult postop rehab but he is going to be monitored very carefully and will go very aggressively with his postoperative rehab in terms of regaining his motion and functional capabilities.  Discharge medicines doxycycline  oxycodone  Robaxin  and Zofran .  They have my cellular phone for any issues problems or concerns.  Is been a pleasure to see him.  Interesting and complex case.  Lizzett Nobile MD

## 2024-03-02 NOTE — Discharge Instructions (Signed)
 Elevate move massage her fingers.  Please call Dr. Camella on his cell phone for any emergencies.  Once again his cell phone is [phone]  Please remove the drain Wednesday morning.  We recommend that you to take vitamin C 1000 mg a day to promote healing. We also recommend that if you require  pain medicine that you take a stool softener to prevent constipation as most pain medicines will have constipation side effects. We recommend either Peri-Colace or Senokot and recommend that you also consider adding MiraLAX  as well to prevent the constipation affects from pain medicine if you are required to use them. These medicines are over the counter and may be purchased at a local pharmacy. A cup of yogurt and a probiotic can also be helpful during the recovery process as the medicines can disrupt your intestinal environment.Keep bandage clean and dry.  Call for any problems.  No smoking.  Criteria for driving a car: you should be off your pain medicine for 7-8 hours, able to drive one handed(confident), thinking clearly and feeling able in your judgement to drive. Continue elevation as it will decrease swelling.  If instructed by MD move your fingers within the confines of the bandage/splint.  Use ice if instructed by your MD. Call immediately for any sudden loss of feeling in your hand/arm or change in functional abilities of the extremity.

## 2024-03-02 NOTE — Anesthesia Procedure Notes (Signed)
 Anesthesia Regional Block: Supraclavicular block   Pre-Anesthetic Checklist: , timeout performed,  Correct Patient, Correct Site, Correct Laterality,  Correct Procedure, Correct Position, site marked,  Risks and benefits discussed,  Surgical consent,  Pre-op evaluation,  At surgeon's request and post-op pain management  Laterality: Right  Prep: chloraprep       Needles:  Injection technique: Single-shot  Needle Type: Echogenic Needle     Needle Length: 9cm  Needle Gauge: 21     Additional Needles:   Procedures:,,,, ultrasound used (permanent image in chart),,    Narrative:  Start time: 03/02/2024 2:04 PM End time: 03/02/2024 2:11 PM Injection made incrementally with aspirations every 5 mL.  Performed by: Personally  Anesthesiologist: Corinne Garnette BRAVO, MD  Additional Notes: No pain on injection. No increased resistance to injection. Injection made in 5cc increments.  Good needle visualization.  Patient tolerated procedure well.

## 2024-03-02 NOTE — Anesthesia Preprocedure Evaluation (Addendum)
 Anesthesia Evaluation  Patient identified by MRN, date of birth, ID band Patient awake    Reviewed: Allergy & Precautions, NPO status , Patient's Chart, lab work & pertinent test results  Airway Mallampati: III  TM Distance: >3 FB Neck ROM: Full    Dental  (+) Teeth Intact, Dental Advisory Given   Pulmonary neg pulmonary ROS   Pulmonary exam normal breath sounds clear to auscultation       Cardiovascular hypertension, Pt. on medications Normal cardiovascular exam Rhythm:Regular Rate:Normal     Neuro/Psych negative neurological ROS     GI/Hepatic negative GI ROS, Neg liver ROS,,,  Endo/Other  negative endocrine ROS    Renal/GU negative Renal ROS     Musculoskeletal right wrist and hand exuberant flexor tenosynovitis   Abdominal   Peds  Hematology negative hematology ROS (+)   Anesthesia Other Findings Day of surgery medications reviewed with the patient.  Reproductive/Obstetrics                              Anesthesia Physical Anesthesia Plan  ASA: 2  Anesthesia Plan: Regional   Post-op Pain Management: Tylenol  PO (pre-op)* and Toradol IV (intra-op)*   Induction: Intravenous  PONV Risk Score and Plan: 1 and Midazolam , TIVA, Dexamethasone  and Ondansetron   Airway Management Planned: Natural Airway and Simple Face Mask  Additional Equipment:   Intra-op Plan:   Post-operative Plan:   Informed Consent: I have reviewed the patients History and Physical, chart, labs and discussed the procedure including the risks, benefits and alternatives for the proposed anesthesia with the patient or authorized representative who has indicated his/her understanding and acceptance.     Dental advisory given  Plan Discussed with: CRNA  Anesthesia Plan Comments:          Anesthesia Quick Evaluation

## 2024-03-03 ENCOUNTER — Encounter (HOSPITAL_COMMUNITY): Payer: Self-pay | Admitting: Orthopedic Surgery

## 2024-03-03 ENCOUNTER — Other Ambulatory Visit (HOSPITAL_COMMUNITY): Payer: Self-pay

## 2024-03-03 ENCOUNTER — Other Ambulatory Visit: Payer: Self-pay

## 2024-03-03 NOTE — Anesthesia Postprocedure Evaluation (Signed)
 Anesthesia Post Note  Patient: Perry Zamora  Procedure(s) Performed: TENOSYNOVECTOMY (Right)     Patient location during evaluation: PACU Anesthesia Type: Regional and MAC Level of consciousness: awake and alert Pain management: pain level controlled Vital Signs Assessment: post-procedure vital signs reviewed and stable Respiratory status: spontaneous breathing, nonlabored ventilation, respiratory function stable and patient connected to nasal cannula oxygen Cardiovascular status: stable and blood pressure returned to baseline Postop Assessment: no apparent nausea or vomiting Anesthetic complications: no   No notable events documented.  Last Vitals:  Vitals:   03/02/24 1745 03/02/24 1800  BP: (!) 143/88 (!) 131/97  Pulse: 61 67  Resp: (!) 25 (!) 23  Temp:  (!) 36.4 C  SpO2: 98% 97%    Last Pain:  Vitals:   03/02/24 1800  TempSrc:   PainSc: 0-No pain   Pain Goal: Patients Stated Pain Goal: 4 (03/02/24 1315)                 Sendy Pluta L Beacher Every

## 2024-03-06 LAB — ACID FAST SMEAR (AFB, MYCOBACTERIA): Acid Fast Smear: NEGATIVE

## 2024-03-06 LAB — FUNGUS CULTURE RESULT

## 2024-03-07 LAB — AEROBIC/ANAEROBIC CULTURE W GRAM STAIN (SURGICAL/DEEP WOUND)
Culture: NO GROWTH
Culture: NO GROWTH
Gram Stain: NONE SEEN
Gram Stain: NONE SEEN

## 2024-03-08 DIAGNOSIS — Z4789 Encounter for other orthopedic aftercare: Secondary | ICD-10-CM | POA: Diagnosis not present

## 2024-03-08 DIAGNOSIS — M65941 Unspecified synovitis and tenosynovitis, right hand: Secondary | ICD-10-CM | POA: Diagnosis not present

## 2024-03-08 DIAGNOSIS — M65931 Unspecified synovitis and tenosynovitis, right forearm: Secondary | ICD-10-CM | POA: Diagnosis not present

## 2024-03-08 LAB — SURGICAL PATHOLOGY

## 2024-03-08 LAB — ACID FAST SMEAR (AFB, MYCOBACTERIA)

## 2024-03-08 LAB — ACID FAST CULTURE WITH REFLEXED SENSITIVITIES (MYCOBACTERIA)

## 2024-03-16 DIAGNOSIS — H04123 Dry eye syndrome of bilateral lacrimal glands: Secondary | ICD-10-CM | POA: Diagnosis not present

## 2024-03-16 DIAGNOSIS — H10413 Chronic giant papillary conjunctivitis, bilateral: Secondary | ICD-10-CM | POA: Diagnosis not present

## 2024-03-17 ENCOUNTER — Other Ambulatory Visit (HOSPITAL_COMMUNITY): Payer: Self-pay

## 2024-03-17 DIAGNOSIS — Z4789 Encounter for other orthopedic aftercare: Secondary | ICD-10-CM | POA: Diagnosis not present

## 2024-03-17 DIAGNOSIS — M65941 Unspecified synovitis and tenosynovitis, right hand: Secondary | ICD-10-CM | POA: Diagnosis not present

## 2024-03-17 MED ORDER — PREDNISOLONE ACETATE 1 % OP SUSP
OPHTHALMIC | 1 refills | Status: AC
  Filled 2024-03-17: qty 5, 14d supply, fill #0
  Filled 2024-03-26 – 2024-03-29 (×2): qty 5, 14d supply, fill #1
  Filled 2024-03-29: qty 5, 21d supply, fill #1

## 2024-03-17 MED ORDER — DOXYCYCLINE HYCLATE 100 MG PO CAPS
100.0000 mg | ORAL_CAPSULE | Freq: Two times a day (BID) | ORAL | 0 refills | Status: DC
  Filled 2024-03-17: qty 28, 14d supply, fill #0

## 2024-03-19 ENCOUNTER — Telehealth: Payer: Self-pay | Admitting: Infectious Disease

## 2024-03-19 NOTE — Telephone Encounter (Signed)
 Dr Camella called me today and wanted us  to see this gentleman with mycobacterial infection   There are a couple of issues as far as timing  One to treat effectively we need to know the ID and the sensis of the organism to treat it effectively.  The sensis may take several weeks to come back  I dont think it is completely unreasonable to craft an empiric regimen ONCE we know the species  The other problem is there is not a lot of open slots  I would be willing to create a spot to see him next Friady say at 9am if we can create a clinic spot for him though if we dont even have the ID will probably have to push off further

## 2024-03-22 ENCOUNTER — Other Ambulatory Visit (HOSPITAL_COMMUNITY): Payer: Self-pay

## 2024-03-23 NOTE — Telephone Encounter (Signed)
 Per Dr. Fleeta Rothman, patient can come in on Friday at 9 AM, but we may not have organism ID back by that point. Called Perry Zamora and explained this. He would like to go ahead and schedule for 12/12 at 9 AM.   Zelta Enfield, BSN, RN

## 2024-03-25 ENCOUNTER — Encounter: Payer: Self-pay | Admitting: Infectious Disease

## 2024-03-25 DIAGNOSIS — A319 Mycobacterial infection, unspecified: Secondary | ICD-10-CM | POA: Insufficient documentation

## 2024-03-25 DIAGNOSIS — M65931 Unspecified synovitis and tenosynovitis, right forearm: Secondary | ICD-10-CM | POA: Insufficient documentation

## 2024-03-25 NOTE — Progress Notes (Deleted)
 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   MRN: [id number]  HPI  Past Medical History:  Diagnosis Date   Basal cell carcinoma 09/29/2023   Right ant shoulder - needs excision   HTN (hypertension)     Past Surgical History:  Procedure Laterality Date   BASAL CELL CARCINOMA EXCISION     FLEXIBLE SIGMOIDOSCOPY N/A 06/25/2023   Procedure: KINGSTON SIDE;  Surgeon: Sheldon Standing, MD;  Location: [facility];  Service: General;  Laterality: N/A;   TENOSYNOVECTOMY Right 03/02/2024   Procedure: TENOSYNOVECTOMY;  Surgeon: Mussel Elsie, MD;  Location: [facility];  Service: Orthopedics;  Laterality: Right;    Family History  Problem Relation Age of Onset   Colon cancer Neg Hx    Colon polyps Neg Hx    Esophageal cancer Neg Hx    Rectal cancer Neg Hx    Stomach cancer Neg Hx       Social History   Socioeconomic History   Marital status: Married    Spouse name: Not on file   Number of children: Not on file   Years of education: Not on file   Highest education level: Some college, no degree  Occupational History   Not on file  Tobacco Use   Smoking status: Never   Smokeless tobacco: Never  Vaping Use   Vaping status: Never Used  Substance and Sexual Activity   Alcohol use: Not Currently    Alcohol/week: 3.0 standard drinks of alcohol    Types: 3 Standard drinks or equivalent per week   Drug use: Never   Sexual activity: Not Currently  Other Topics Concern   Not on file  Social History Narrative   Family originally from [place]   Social Drivers of Health   Tobacco Use: Low Risk (03/02/2024)   Patient History    Smoking Tobacco Use: Never    Smokeless Tobacco Use: Never    Passive Exposure: Not on file  Financial Resource Strain: Medium Risk (10/01/2023)   Overall Financial  Resource Strain (CARDIA)    Difficulty of Paying Living Expenses: Somewhat hard  Food Insecurity: No Food Insecurity (10/01/2023)   Epic    Worried About Programme Researcher, Broadcasting/film/video in the Last Year: Never true    Ran Out of Food in the Last Year: Never true  Transportation Needs: No Transportation Needs (10/01/2023)   Epic    Lack of Transportation (Medical): No    Lack of Transportation (Non-Medical): No  Physical Activity: Sufficiently Active (10/01/2023)   Exercise Vital Sign    Days of Exercise per Week: 5 days    Minutes of Exercise per Session: 60 min  Stress: Stress Concern Present (10/01/2023)   [employer] of Occupational Health - Occupational Stress Questionnaire    Feeling of Stress: To some extent  Social Connections: Moderately Isolated (10/01/2023)   Social Connection and Isolation Panel    Frequency of Communication with Friends and Family: More than three times a week    Frequency of Social Gatherings with Friends and Family: Patient declined    Attends Religious Services: Patient declined    Active Member of Clubs or Organizations: No    Attends Banker Meetings: Not on file    Marital Status: Married  Depression (PHQ2-9): Low Risk (10/02/2023)   Depression (PHQ2-9)  PHQ-2 Score: 0  Alcohol Screen: Low Risk (10/01/2023)   Alcohol Screen    Last Alcohol Screening Score (AUDIT): 1  Housing: Unknown (10/01/2023)   Epic    Unable to Pay for Housing in the Last Year: No    Number of Times Moved in the Last Year: Not on file    Homeless in the Last Year: No  Utilities: Not At Risk (06/25/2023)   AHC Utilities    Threatened with loss of utilities: No  Health Literacy: Not on file    Allergies[1]  Current Medications[2]   Review of Systems     Objective:   Physical Exam        Assessment & Plan:       [1]  Allergies Allergen Reactions   Gabapentin  Other (See Comments)    Crazy dreams  [2]  Current Outpatient Medications:     celecoxib  (CELEBREX ) 200 MG capsule, Take 1 capsule (200 mg total) by mouth 2 (two) times daily between meals as needed., Disp: 60 capsule, Rfl: 1   diclofenac  (VOLTAREN ) 75 MG EC tablet, Take 1 tablet (75 mg) by mouth 2 times daily as needed., Disp: 30 tablet, Rfl: 1   doxycycline  (VIBRAMYCIN ) 100 MG capsule, Take 1 capsule (100 mg total) by mouth 2 (two) times daily for 14 days., Disp: 28 capsule, Rfl: 0   escitalopram  (LEXAPRO ) 10 MG tablet, Take 1 tablet (10 mg) by mouth daily., Disp: 90 tablet, Rfl: 1   hydrochlorothiazide  (HYDRODIURIL ) 25 MG tablet, Take 1 tablet (25 mg total) by mouth every morning., Disp: 90 tablet, Rfl: 3   Hypromellose (V[place] DRY EYE RELIEF) 2.5 % SOLN, Apply 1 drop to eye 3 (three) times daily as needed., Disp: 15 mL, Rfl: 1   losartan  (COZAAR ) 50 MG tablet, Take 1 tablet (50 mg total) by mouth daily., Disp: 90 tablet, Rfl: 3   methocarbamol  (ROBAXIN ) 500 MG tablet, Take 1 tablet (500 mg total) by mouth every 6 (six) to 8 (eight) hours as needed for spasm for 10 days., Disp: 40 tablet, Rfl: 0   ondansetron  (ZOFRAN ) 4 MG tablet, Take 1 tablet (4 mg total) by mouth every 8 (eight) hours as needed for nausea., Disp: 15 tablet, Rfl: 1   ondansetron  (ZOFRAN -ODT) 4 MG disintegrating tablet, Take 1 tablet (4 mg total) by mouth every 8 (eight) hours as needed., Disp: 20 tablet, Rfl: 0   oxyCODONE  (OXY IR/ROXICODONE ) 5 MG immediate release tablet, Take 1 tablet (5 mg total) by mouth every 4-6 hours as needed for pain, Disp: 42 tablet, Rfl: 0   oxyCODONE  (OXY IR/ROXICODONE ) 5 MG immediate release tablet, Take 1 tablet (5 mg total) by mouth every 4 (four) to 6 (six) hours as needed for pain for 7 days., Disp: 42 tablet, Rfl: 0   pantoprazole  (PROTONIX ) 20 MG tablet, Take 1 tablet (20 mg total) by mouth daily for 14 days., Disp: 14 tablet, Rfl: 0   prednisoLONE  acetate (PRED FORTE ) 1 % ophthalmic suspension, Place 1 drop into both eyes 4 (four) times daily for 7 days, THEN 1 drop 2  (two) times daily for 7 days., Disp: 5 mL, Rfl: 1   tamsulosin  (FLOMAX ) 0.4 MG CAPS capsule, Take 1 capsule (0.4 mg total) by mouth daily., Disp: 90 capsule, Rfl: 3   tiZANidine  (ZANAFLEX ) 4 MG tablet, Take 1 tablet (4 mg total) by mouth every 8 (eight) hours as needed for muscle spasms., Disp: 30 tablet, Rfl: 1   traMADol  (ULTRAM ) 50 MG tablet, Take 1  tablet (50 mg total) by mouth every 6 (six) hours as needed for up to 8 doses., Disp: 8 tablet, Rfl: 0

## 2024-03-26 ENCOUNTER — Other Ambulatory Visit: Payer: Self-pay

## 2024-03-26 ENCOUNTER — Other Ambulatory Visit (HOSPITAL_COMMUNITY): Payer: Self-pay

## 2024-03-26 ENCOUNTER — Ambulatory Visit: Admitting: Infectious Disease

## 2024-03-26 ENCOUNTER — Other Ambulatory Visit (HOSPITAL_BASED_OUTPATIENT_CLINIC_OR_DEPARTMENT_OTHER): Payer: Self-pay

## 2024-03-26 ENCOUNTER — Telehealth: Payer: Self-pay | Admitting: Infectious Disease

## 2024-03-26 MED ORDER — OXYCODONE HCL 5 MG PO TABS
ORAL_TABLET | ORAL | 0 refills | Status: DC
  Filled 2024-03-26: qty 50, 8d supply, fill #0

## 2024-03-26 MED ORDER — DOXYCYCLINE HYCLATE 100 MG PO CAPS
ORAL_CAPSULE | ORAL | 1 refills | Status: DC
  Filled 2024-03-26 – 2024-03-29 (×5): qty 28, 14d supply, fill #0

## 2024-03-26 NOTE — Telephone Encounter (Signed)
 Perry Zamora called to cancel his appt due to results not being in. Pt stated results should be in next Friday. I informed pt of Dr. Fleeta Dam's next available or next available with another provider but he requested Dr. Fleeta Rothman to advise if he can work him in next Friday or his next steps. Pt can be reached at [phone].

## 2024-03-27 ENCOUNTER — Other Ambulatory Visit (HOSPITAL_COMMUNITY): Payer: Self-pay

## 2024-03-29 ENCOUNTER — Other Ambulatory Visit (HOSPITAL_COMMUNITY): Payer: Self-pay

## 2024-03-29 ENCOUNTER — Other Ambulatory Visit: Payer: Self-pay

## 2024-03-29 ENCOUNTER — Other Ambulatory Visit (HOSPITAL_BASED_OUTPATIENT_CLINIC_OR_DEPARTMENT_OTHER): Payer: Self-pay

## 2024-03-30 ENCOUNTER — Ambulatory Visit: Admitting: Physician Assistant

## 2024-03-31 ENCOUNTER — Other Ambulatory Visit (HOSPITAL_COMMUNITY): Payer: Self-pay

## 2024-04-01 ENCOUNTER — Other Ambulatory Visit (HOSPITAL_COMMUNITY): Payer: Self-pay

## 2024-04-01 MED ORDER — DOXYCYCLINE HYCLATE 100 MG PO CAPS
100.0000 mg | ORAL_CAPSULE | Freq: Two times a day (BID) | ORAL | 0 refills | Status: DC
  Filled 2024-04-05: qty 28, 14d supply, fill #0

## 2024-04-05 ENCOUNTER — Encounter: Payer: Self-pay | Admitting: Infectious Disease

## 2024-04-05 ENCOUNTER — Other Ambulatory Visit: Payer: Self-pay

## 2024-04-05 ENCOUNTER — Ambulatory Visit (INDEPENDENT_AMBULATORY_CARE_PROVIDER_SITE_OTHER): Admitting: Infectious Disease

## 2024-04-05 ENCOUNTER — Other Ambulatory Visit (HOSPITAL_COMMUNITY): Payer: Self-pay

## 2024-04-05 VITALS — BP 130/91 | HR 105 | Temp 97.6°F | Ht 63.0 in | Wt 178.0 lb

## 2024-04-05 DIAGNOSIS — A319 Mycobacterial infection, unspecified: Secondary | ICD-10-CM | POA: Diagnosis not present

## 2024-04-05 DIAGNOSIS — N401 Enlarged prostate with lower urinary tract symptoms: Secondary | ICD-10-CM | POA: Diagnosis not present

## 2024-04-05 DIAGNOSIS — R3914 Feeling of incomplete bladder emptying: Secondary | ICD-10-CM

## 2024-04-05 DIAGNOSIS — M6510 Other infective (teno)synovitis, unspecified site: Secondary | ICD-10-CM | POA: Diagnosis not present

## 2024-04-05 DIAGNOSIS — K760 Fatty (change of) liver, not elsewhere classified: Secondary | ICD-10-CM

## 2024-04-05 DIAGNOSIS — G5601 Carpal tunnel syndrome, right upper limb: Secondary | ICD-10-CM

## 2024-04-05 DIAGNOSIS — K7010 Alcoholic hepatitis without ascites: Secondary | ICD-10-CM | POA: Insufficient documentation

## 2024-04-05 DIAGNOSIS — N321 Vesicointestinal fistula: Secondary | ICD-10-CM | POA: Diagnosis not present

## 2024-04-05 DIAGNOSIS — R7989 Other specified abnormal findings of blood chemistry: Secondary | ICD-10-CM | POA: Diagnosis not present

## 2024-04-05 MED ORDER — ONDANSETRON 4 MG PO TBDP
ORAL_TABLET | ORAL | 5 refills | Status: DC
  Filled 2024-04-05 – 2024-04-13 (×4): qty 60, 20d supply, fill #0

## 2024-04-05 MED ORDER — RIFAMPIN 300 MG PO CAPS
600.0000 mg | ORAL_CAPSULE | Freq: Every day | ORAL | 5 refills | Status: AC
  Filled 2024-04-05 – 2024-04-13 (×4): qty 60, 30d supply, fill #0
  Filled 2024-04-29 – 2024-05-05 (×2): qty 60, 30d supply, fill #1

## 2024-04-05 MED ORDER — ETHAMBUTOL HCL 400 MG PO TABS
1200.0000 mg | ORAL_TABLET | Freq: Every day | ORAL | 5 refills | Status: AC
  Filled 2024-04-05 – 2024-04-13 (×5): qty 90, 30d supply, fill #0
  Filled 2024-04-29 – 2024-05-05 (×2): qty 90, 30d supply, fill #1

## 2024-04-05 MED ORDER — AZITHROMYCIN 500 MG PO TABS
500.0000 mg | ORAL_TABLET | Freq: Every day | ORAL | 5 refills | Status: AC
  Filled 2024-04-05 – 2024-04-09 (×2): qty 30, 30d supply, fill #0
  Filled 2024-04-29 – 2024-05-05 (×2): qty 30, 30d supply, fill #1

## 2024-04-05 NOTE — Telephone Encounter (Signed)
 Patient is scheduled to see Dr. Fleeta Rothman today @ 1:45 pm. Patient is aware of appointment time and date.

## 2024-04-05 NOTE — Progress Notes (Signed)
 " Reason for Infectious Disease Consult; Mycobacterium szulgae wrist infection  Requesting Physician: Elsie Mussel, MD  PCP; Emil Schaumann, MD  Subjective:    Patient ID: Perry Zamora, male    DOB: 27-Sep-1960, 63 y.o.   MRN: [id number]  HPI  Discussed the use of AI scribe software for clinical note transcription with the patient, who gave verbal consent to proceed.  History of Present Illness   JACOBUS COLVIN is a 63 year old male w hx of NASH, alcoholic hepatitis, Colo vesicular fistula, BPH, HTN  who presents with chronic pain and inflammation in the right wrist and hand following carpal tunnel surgery and then surgery as described below  He underwent carpal tunnel surgery on October 03, 2023, but continued to experience pain and swelling in his right wrist and hand. Despite the surgery, the inflammation persisted, leading to further investigation and treatment.  On March 02, 2024, he was brought to the operating room with a preoperative diagnosis of extensive tenosynovitis of the right hand, wrist, and forearm, characterized by exuberant tenosynovium and chronic pain. He underwent extensive surgical procedures including tendon synovectomy and median nerve neurolysis, with tissue sent for pathology and culture.  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 Sensitivities are pending.  He has a history of fatty liver disease, which is relevant due to potential interactions with medications for the mycobacterial infection. Current medications include blood pressure medication, a diuretic, and Flomax . He has stopped taking pain medications and antidepressants. Doxycycline , which he was taking, caused stomach upset and has been discontinued.   Of note the last LFTS we have on him were dramatically elevated in 600 range but appear to have occurred in context of heavy alcohol ingestion.  He has a history of a fistula repair and  mole removal surgeries.   He experiences pain in the right wrist and hand, particularly with certain movements. No recent trauma or injury to the arm was reported.       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   Tenosynovitis of right wrist 03/25/2024    Past Surgical History:  Procedure Laterality Date   BASAL CELL CARCINOMA EXCISION     FLEXIBLE SIGMOIDOSCOPY N/A 06/25/2023   Procedure: KINGSTON SIDE;  Surgeon: Sheldon Standing, MD;  Location: [facility];  Service: General;  Laterality: N/A;   TENOSYNOVECTOMY Right 03/02/2024   Procedure: TENOSYNOVECTOMY;  Surgeon: Mussel Elsie, MD;  Location: [facility];  Service: Orthopedics;  Laterality: Right;    Family History  Problem Relation Age of Onset   Colon cancer Neg Hx    Colon polyps Neg Hx    Esophageal cancer Neg Hx    Rectal cancer Neg Hx    Stomach cancer Neg Hx       Social History   Socioeconomic History   Marital status: Married    Spouse name: Not on file   Number of children: Not on file   Years of education: Not on file   Highest education level: Some college, no degree  Occupational History   Not on file  Tobacco Use   Smoking status: Never   Smokeless tobacco: Never  Vaping Use   Vaping status: Never Used  Substance and Sexual Activity   Alcohol use: Not Currently    Alcohol/week: 3.0 standard drinks of alcohol    Types:  3 Standard drinks or equivalent per week   Drug use: Never   Sexual activity: Not Currently  Other Topics Concern   Not on file  Social History Narrative   Family originally from [place]   Social Drivers of Health   Tobacco Use: Low Risk (03/02/2024)   Patient History    Smoking Tobacco Use: Never    Smokeless Tobacco Use: Never    Passive Exposure: Not on file  Financial Resource Strain: Medium Risk (10/01/2023)   Overall Financial Resource  Strain (CARDIA)    Difficulty of Paying Living Expenses: Somewhat hard  Food Insecurity: No Food Insecurity (10/01/2023)   Epic    Worried About Programme Researcher, Broadcasting/film/video in the Last Year: Never true    Ran Out of Food in the Last Year: Never true  Transportation Needs: No Transportation Needs (10/01/2023)   Epic    Lack of Transportation (Medical): No    Lack of Transportation (Non-Medical): No  Physical Activity: Sufficiently Active (10/01/2023)   Exercise Vital Sign    Days of Exercise per Week: 5 days    Minutes of Exercise per Session: 60 min  Stress: Stress Concern Present (10/01/2023)   [employer] of Occupational Health - Occupational Stress Questionnaire    Feeling of Stress: To some extent  Social Connections: Moderately Isolated (10/01/2023)   Social Connection and Isolation Panel    Frequency of Communication with Friends and Family: More than three times a week    Frequency of Social Gatherings with Friends and Family: Patient declined    Attends Religious Services: Patient declined    Database Administrator or Organizations: No    Attends Engineer, Structural: Not on file    Marital Status: Married  Depression (PHQ2-9): Low Risk (10/02/2023)   Depression (PHQ2-9)    PHQ-2 Score: 0  Alcohol Screen: Low Risk (10/01/2023)   Alcohol Screen    Last Alcohol Screening Score (AUDIT): 1  Housing: Unknown (10/01/2023)   Epic    Unable to Pay for Housing in the Last Year: No    Number of Times Moved in the Last Year: Not on file    Homeless in the Last Year: No  Utilities: Not At Risk (06/25/2023)   [facility]HC Utilities    Threatened with loss of utilities: No  Health Literacy: Not on file    Allergies[1]  Current Medications[2]   Review of Systems  Constitutional:  Negative for activity change, appetite change, chills, diaphoresis, fatigue, fever and unexpected weight change.  HENT:  Negative for congestion, rhinorrhea, sinus pressure, sneezing, sore throat and trouble  swallowing.   Eyes:  Negative for photophobia and visual disturbance.  Respiratory:  Negative for cough, chest tightness, shortness of breath, wheezing and stridor.   Cardiovascular:  Negative for chest pain, palpitations and leg swelling.  Gastrointestinal:  Negative for abdominal distention, abdominal pain, anal bleeding, blood in stool, constipation, diarrhea, nausea and vomiting.  Genitourinary:  Negative for difficulty urinating, dysuria, flank pain and hematuria.  Musculoskeletal:  Positive for arthralgias. Negative for back pain, gait problem, joint swelling and myalgias.  Skin:  Negative for color change, pallor, rash and wound.  Neurological:  Negative for dizziness, tremors, weakness and light-headedness.  Hematological:  Negative for adenopathy. Does not bruise/bleed easily.  Psychiatric/Behavioral:  Negative for agitation, behavioral problems, confusion, decreased concentration, dysphoric mood and sleep disturbance.        Objective:   Physical Exam Constitutional:      Appearance: He is well-developed.  HENT:     Head: Normocephalic and atraumatic.  Eyes:     Conjunctiva/sclera: Conjunctivae normal.  Cardiovascular:     Rate and Rhythm: Normal rate and regular rhythm.  Pulmonary:     Effort: Pulmonary effort is normal. No respiratory distress.     Breath sounds: No wheezing.  Abdominal:     General: There is no distension.     Palpations: Abdomen is soft.  Musculoskeletal:     Cervical back: Normal range of motion and neck supple.  Skin:    General: Skin is warm and dry.     Coloration: Skin is not pale.     Findings: No erythema or rash.  Neurological:     General: No focal deficit present.     Mental Status: He is alert and oriented to person, place, and time.  Psychiatric:        Mood and Affect: Mood normal.        Behavior: Behavior normal.        Thought Content: Thought content normal.        Judgment: Judgment normal.     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 Treatment requires multiple antibiotics for 3-6 months--or longer. Risks include liver toxicity from rifampin  and gastrointestinal upset from azithromycin  and rifampin , ocular toxitiy from ethambutol .   FIRST of all we will check CMP and CBC w ESR and CRP   IF CMP HAS LEFT'S MORE IN LINE WITH WHAT APPEAR TO BE HIS BASELINE LABS THEN WILL PROCEED WITH  - Initiated azithromycin  500 mg daily. - Initiated rifampin  600 mg daily,  - Initiated ethambutol  1200mg  daily - Pharmacist counseling on medication interactions and side effects arranged.     IF his LFTs are still up would vote for Clofazamine, Azithromycin  and ETH  Hepatic steatosis with elevated liver function tests Hepatic steatosis with mildly elevated liver function tests, requires monitoring due to potential hepatotoxicity from rifampin . - Monitor liver function tests regularly during antibiotic treatment. - Advised against alcohol consumption      Alcoholic hepatitis episode: really critical he abstain from alcohol esp while on treatment for his mycobacterial infection  BPH: on flomax , he requests PSA which we have ordered   HTN on HCTZ      I personally spent a total of 64 minutes in the care of the patient today including preparing to see the patient, getting/reviewing separately obtained history, performing a medically appropriate exam/evaluation, counseling and educating, placing orders, referring and communicating with other health care professionals, documenting clinical information in the EHR, independently interpreting results, communicating results, and coordinating care.      [1]  Allergies Allergen Reactions   Gabapentin  Other (See Comments)    Crazy dreams  [2]  Current  Outpatient Medications:    celecoxib  (CELEBREX ) 200 MG capsule, Take 1 capsule (200 mg total) by mouth 2 (two) times daily between meals as needed., Disp: 60 capsule, Rfl: 1   diclofenac  (VOLTAREN ) 75 MG EC tablet, Take 1 tablet (75 mg) by mouth 2 times daily as needed., Disp: 30 tablet, Rfl: 1   doxycycline  (VIBRAMYCIN ) 100 MG capsule, Take 1 capsule by mouth twice a day for 14 days., Disp: 28 capsule, Rfl: 1   doxycycline  (VIBRAMYCIN ) 100 MG capsule, Take 1 capsule (100 mg total) by  mouth 2 (two) times daily for 14 days., Disp: 28 capsule, Rfl: 0   escitalopram  (LEXAPRO ) 10 MG tablet, Take 1 tablet (10 mg) by mouth daily., Disp: 90 tablet, Rfl: 1   hydrochlorothiazide  (HYDRODIURIL ) 25 MG tablet, Take 1 tablet (25 mg total) by mouth every morning., Disp: 90 tablet, Rfl: 3   Hypromellose (VISTA GONIO DRY EYE RELIEF) 2.5 % SOLN, Apply 1 drop to eye 3 (three) times daily as needed., Disp: 15 mL, Rfl: 1   losartan  (COZAAR ) 50 MG tablet, Take 1 tablet (50 mg total) by mouth daily., Disp: 90 tablet, Rfl: 3   methocarbamol  (ROBAXIN ) 500 MG tablet, Take 1 tablet (500 mg total) by mouth every 6 (six) to 8 (eight) hours as needed for spasm for 10 days., Disp: 40 tablet, Rfl: 0   ondansetron  (ZOFRAN ) 4 MG tablet, Take 1 tablet (4 mg total) by mouth every 8 (eight) hours as needed for nausea., Disp: 15 tablet, Rfl: 1   ondansetron  (ZOFRAN -ODT) 4 MG disintegrating tablet, Take 1 tablet (4 mg total) by mouth every 8 (eight) hours as needed., Disp: 20 tablet, Rfl: 0   oxyCODONE  (OXY IR/ROXICODONE ) 5 MG immediate release tablet, Take 1 tablet (5 mg total) by mouth every 4-6 hours as needed for pain, Disp: 42 tablet, Rfl: 0   oxyCODONE  (OXY IR/ROXICODONE ) 5 MG immediate release tablet, Take 1 tablet (5 mg total) by mouth every 4 (four) to 6 (six) hours as needed for pain for 7 days., Disp: 42 tablet, Rfl: 0   oxyCODONE  (OXY IR/ROXICODONE ) 5 MG immediate release tablet, Take 1 tablet every 4-6 hours by oral route as  needed for pain., Disp: 50 tablet, Rfl: 0   pantoprazole  (PROTONIX ) 20 MG tablet, Take 1 tablet (20 mg total) by mouth daily for 14 days., Disp: 14 tablet, Rfl: 0   prednisoLONE  acetate (PRED FORTE ) 1 % ophthalmic suspension, Place 1 drop into both eyes 4 (four) times daily for 7 days, THEN 1 drop 2 (two) times daily for 7 days., Disp: 5 mL, Rfl: 1   tamsulosin  (FLOMAX ) 0.4 MG CAPS capsule, Take 1 capsule (0.4 mg total) by mouth daily., Disp: 90 capsule, Rfl: 3   tiZANidine  (ZANAFLEX ) 4 MG tablet, Take 1 tablet (4 mg total) by mouth every 8 (eight) hours as needed for muscle spasms., Disp: 30 tablet, Rfl: 1   traMADol  (ULTRAM ) 50 MG tablet, Take 1 tablet (50 mg total) by mouth every 6 (six) hours as needed for up to 8 doses., Disp: 8 tablet, Rfl: 0  "

## 2024-04-06 ENCOUNTER — Ambulatory Visit: Payer: Self-pay

## 2024-04-06 ENCOUNTER — Other Ambulatory Visit: Payer: Self-pay

## 2024-04-06 LAB — CBC WITH DIFFERENTIAL/PLATELET
Absolute Lymphocytes: 1960 {cells}/uL (ref 850–3900)
Absolute Monocytes: 449 {cells}/uL (ref 200–950)
Basophils Absolute: 69 {cells}/uL (ref 0–200)
Basophils Relative: 1 %
Eosinophils Absolute: 131 {cells}/uL (ref 15–500)
Eosinophils Relative: 1.9 %
HCT: 40.7 % (ref 39.4–51.1)
Hemoglobin: 13.3 g/dL (ref 13.2–17.1)
MCH: 31.4 pg (ref 27.0–33.0)
MCHC: 32.7 g/dL (ref 31.6–35.4)
MCV: 96 fL (ref 81.4–101.7)
MPV: 9.7 fL (ref 7.5–12.5)
Monocytes Relative: 6.5 %
Neutro Abs: 4292 {cells}/uL (ref 1500–7800)
Neutrophils Relative %: 62.2 %
Platelets: 287 Thousand/uL (ref 140–400)
RBC: 4.24 Million/uL (ref 4.20–5.80)
RDW: 13.2 % (ref 11.0–15.0)
Total Lymphocyte: 28.4 %
WBC: 6.9 Thousand/uL (ref 3.8–10.8)

## 2024-04-06 LAB — C-REACTIVE PROTEIN: CRP: <3 mg/L

## 2024-04-06 LAB — COMPLETE METABOLIC PANEL WITHOUT GFR
AG Ratio: 1.5 (calc) (ref 1.0–2.5)
ALT: 19 U/L (ref 9–46)
AST: 21 U/L (ref 10–35)
Albumin: 4.4 g/dL (ref 3.6–5.1)
Alkaline phosphatase (APISO): 44 U/L (ref 35–144)
BUN: 17 mg/dL (ref 7–25)
CO2: 27 mmol/L (ref 20–32)
Calcium: 9.7 mg/dL (ref 8.6–10.3)
Chloride: 100 mmol/L (ref 98–110)
Creat: 0.86 mg/dL (ref 0.70–1.35)
Globulin: 3 g/dL (ref 1.9–3.7)
Glucose, Bld: 119 mg/dL — ABNORMAL HIGH (ref 65–99)
Potassium: 3.7 mmol/L (ref 3.5–5.3)
Sodium: 137 mmol/L (ref 135–146)
Total Bilirubin: 0.6 mg/dL (ref 0.2–1.2)
Total Protein: 7.4 g/dL (ref 6.1–8.1)

## 2024-04-06 LAB — SEDIMENTATION RATE: Sed Rate: 6 mm/h (ref 0–20)

## 2024-04-06 LAB — PSA: PSA: 6.06 ng/mL — ABNORMAL HIGH

## 2024-04-06 LAB — FUNGUS CULTURE WITH STAIN

## 2024-04-06 LAB — FUNGAL ORGANISM REFLEX

## 2024-04-06 LAB — FUNGUS CULTURE RESULT

## 2024-04-09 ENCOUNTER — Other Ambulatory Visit (HOSPITAL_BASED_OUTPATIENT_CLINIC_OR_DEPARTMENT_OTHER): Payer: Self-pay

## 2024-04-09 ENCOUNTER — Other Ambulatory Visit: Payer: Self-pay

## 2024-04-09 ENCOUNTER — Other Ambulatory Visit (HOSPITAL_COMMUNITY): Payer: Self-pay

## 2024-04-12 ENCOUNTER — Other Ambulatory Visit: Payer: Self-pay

## 2024-04-12 ENCOUNTER — Other Ambulatory Visit (HOSPITAL_COMMUNITY): Payer: Self-pay

## 2024-04-12 ENCOUNTER — Other Ambulatory Visit (HOSPITAL_BASED_OUTPATIENT_CLINIC_OR_DEPARTMENT_OTHER): Payer: Self-pay

## 2024-04-12 MED ORDER — OXYCODONE HCL 5 MG PO TABS
5.0000 mg | ORAL_TABLET | ORAL | 0 refills | Status: DC | PRN
  Filled 2024-04-12 – 2024-04-13 (×2): qty 50, 9d supply, fill #0

## 2024-04-13 ENCOUNTER — Other Ambulatory Visit (HOSPITAL_COMMUNITY): Payer: Self-pay

## 2024-04-13 ENCOUNTER — Other Ambulatory Visit: Payer: Self-pay

## 2024-04-13 ENCOUNTER — Other Ambulatory Visit (HOSPITAL_BASED_OUTPATIENT_CLINIC_OR_DEPARTMENT_OTHER): Payer: Self-pay

## 2024-04-15 LAB — ACID FAST CULTURE WITH REFLEXED SENSITIVITIES (MYCOBACTERIA): Acid Fast Culture: POSITIVE — AB

## 2024-04-15 LAB — ACID FAST ID BY PCR AND SUSCEPTIBILITIES
M Tuberculosis Complex: NEGATIVE
M avium complex: NEGATIVE

## 2024-04-15 LAB — ORGANISM ID BY MALDI

## 2024-04-15 LAB — MYCOBACTERIA ID BY MALDI

## 2024-04-20 ENCOUNTER — Other Ambulatory Visit (HOSPITAL_COMMUNITY): Payer: Self-pay

## 2024-04-23 ENCOUNTER — Telehealth: Payer: Self-pay

## 2024-04-23 LAB — ORGANISM ID BY MALDI

## 2024-04-23 LAB — FUNGUS CULTURE WITH STAIN

## 2024-04-23 LAB — SLOW GROWER BROTH SUSCEP.
Amikacin: 2
Ciprofloxacin: 4
Clarithromycin: 0.12
Doxycycline: 8
Linezolid: 4
Minocycline: 8
Moxifloxacin: 0.5
Rifabutin: 0.5
Rifampin: 1
Streptomycin: 4

## 2024-04-23 LAB — ACID FAST ID BY PCR AND SUSCEPTIBILITIES
M Tuberculosis Complex: NEGATIVE
M avium complex: NEGATIVE

## 2024-04-23 LAB — FUNGAL ORGANISM REFLEX

## 2024-04-23 LAB — ACID FAST SMEAR (AFB, MYCOBACTERIA): Acid Fast Smear: NEGATIVE

## 2024-04-23 LAB — MYCOBACTERIA ID BY MALDI

## 2024-04-23 LAB — FUNGUS CULTURE RESULT

## 2024-04-23 LAB — ACID FAST CULTURE WITH REFLEXED SENSITIVITIES (MYCOBACTERIA): Acid Fast Culture: POSITIVE — AB

## 2024-04-23 NOTE — Telephone Encounter (Signed)
 Patient called asking to speak with Dr.Van Dam regarding results culture results in [facility]. Patient wants to be sure he is on the right antibiotics. Dr.Van Dam the only ones I see is the culture results that he could be referring to. It has date of completion on 04/22/2024.    Perry Zamora [name], CMA

## 2024-04-23 NOTE — Telephone Encounter (Signed)
 Unfortunately, this organism (mycobacterium szulgai) does not have established breakpoints, but yes I agree that his regimen of azithromycin , ethambutol , and rifampin  should be an appropriate treatment for the infection based on prior clinical outcomes in case reports.

## 2024-04-26 ENCOUNTER — Telehealth: Payer: Self-pay | Admitting: Pharmacist

## 2024-04-26 NOTE — Telephone Encounter (Signed)
 Patient asked if he needed to come in next week for appointment with Cassie. Reviewed that he will have liver function monitoring and discuss adherence to regimen. He asked to change timing to Tuesday so he could come in early instead of around lunch.  He asked if he could take rifampin  with food because it gives him heart burn. Encouraged him to continue taking on an empty stomach (1 hour before or 2 hours after eating) for best absorption and effect. He says taking Pepcid helps with his symptoms.   Alan Geralds, PharmD, CPP, BCIDP, AAHIVP Clinical Pharmacist Practitioner Infectious Diseases Clinical Pharmacist [facility]

## 2024-04-27 ENCOUNTER — Other Ambulatory Visit (HOSPITAL_COMMUNITY): Payer: Self-pay

## 2024-04-29 ENCOUNTER — Other Ambulatory Visit: Payer: Self-pay | Admitting: Emergency Medicine

## 2024-04-29 ENCOUNTER — Other Ambulatory Visit (HOSPITAL_COMMUNITY): Payer: Self-pay

## 2024-04-29 ENCOUNTER — Other Ambulatory Visit: Payer: Self-pay

## 2024-04-29 MED ORDER — ESCITALOPRAM OXALATE 10 MG PO TABS
10.0000 mg | ORAL_TABLET | Freq: Every day | ORAL | 1 refills | Status: AC
  Filled 2024-05-05: qty 90, 90d supply, fill #0

## 2024-04-29 MED ORDER — OXYCODONE HCL 5 MG PO TABS
ORAL_TABLET | ORAL | 0 refills | Status: AC
  Filled 2024-04-29 – 2024-05-05 (×2): qty 42, 7d supply, fill #0

## 2024-05-03 ENCOUNTER — Ambulatory Visit: Payer: Self-pay | Admitting: Pharmacist

## 2024-05-03 ENCOUNTER — Other Ambulatory Visit (HOSPITAL_COMMUNITY): Payer: Self-pay

## 2024-05-03 NOTE — Progress Notes (Unsigned)
 "  HPI: Perry Zamora is a 64 y.o. male who presents to the [facility] pharmacy clinic for 43-month follow-up of treatment for Mycobacterium szulgai infection.  Referring ID Provider: Dr. 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   Lipoma of face 12/11/2023   Need for shingles vaccine 12/11/2023   Need for vaccination against Streptococcus pneumoniae 12/11/2023   Right hand pain 12/11/2023   Primary osteoarthritis, right hand 10/22/2023   Trigger finger, right little finger 10/22/2023   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   Right wrist pain 10/02/2023   Swelling of right hand 10/02/2023   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   CELECOXIB  (CELEBREX ) 200 MG CAPSULE    Take 1 capsule (200 mg total) by mouth 2 (two) times daily between meals as needed.   DICLOFENAC  (VOLTAREN ) 75 MG EC TABLET    Take 1 tablet (75 mg) by mouth 2 times daily as needed.   ESCITALOPRAM  (LEXAPRO ) 10 MG TABLET    Take 1 tablet (10 mg) by mouth daily.    ETHAMBUTOL  (MYAMBUTOL ) 400 MG TABLET    Take 3 tablets (1,200 mg total) by mouth daily.   HYDROCHLOROTHIAZIDE  (HYDRODIURIL ) 25 MG TABLET    Take 1 tablet (25 mg total) by mouth every morning.   HYPROMELLOSE (VISTA GONIO DRY EYE RELIEF) 2.5 % SOLN    Apply 1 drop to eye 3 (three) times daily as needed.   LOSARTAN  (COZAAR ) 50 MG TABLET    Take 1 tablet (50 mg total) by mouth daily.   METHOCARBAMOL  (ROBAXIN ) 500 MG TABLET    Take 1 tablet (500 mg total) by mouth every 6 (six) to 8 (eight) hours as needed for spasm for 10 days.   ONDANSETRON  (ZOFRAN ) 4 MG TABLET    Take 1 tablet (4 mg total) by mouth every 8 (eight) hours as needed for nausea.   ONDANSETRON  (ZOFRAN -ODT) 4 MG DISINTEGRATING TABLET    Dissolve 1 tablet in mouth one hour before taking your rifampin , ethambutol , and azithromycin  and every 8 hours as needed for nausea   OXYCODONE  (OXY IR/ROXICODONE ) 5 MG IMMEDIATE RELEASE TABLET    Take 1 tablet (5 mg total) by mouth every 4-6 hours as needed for pain   OXYCODONE  (OXY IR/ROXICODONE ) 5 MG IMMEDIATE RELEASE TABLET    Take 1 tablet (5 mg total) by mouth every 4 -6 hours as needed for pain.   OXYCODONE  (OXY IR/ROXICODONE ) 5 MG IMMEDIATE RELEASE TABLET    Take 1 tablet (5 mg total) by mouth every 4 (four) to 6 (six) hours as needed  for pain for 7 days.   PANTOPRAZOLE  (PROTONIX ) 20 MG TABLET    Take 1 tablet (20 mg total) by mouth daily for 14 days.   RIFAMPIN  (RIFADIN ) 300 MG CAPSULE    Take 2 capsules (600 mg total) by mouth daily. Take on an EMPTY stomach   TAMSULOSIN  (FLOMAX ) 0.4 MG CAPS CAPSULE    Take 1 capsule (0.4 mg total) by mouth daily.   TIZANIDINE  (ZANAFLEX ) 4 MG TABLET    Take 1 tablet (4 mg total) by mouth every 8 (eight) hours as needed for muscle spasms.   TRAMADOL  (ULTRAM ) 50 MG TABLET    Take 1 tablet (50 mg total) by mouth every 6 (six) hours as needed for up to 8 doses.  Modified Medications   No medications on file  Discontinued Medications   No medications on file     Assessment: Fran was seen on 04/05/24 by Dr. Fleeta Rothman for infection of the right hand/wrist. Intraoperative cultures obtained on 03/02/24 revealed Mycobacterium szulgai. He is currently being treated with azithromycin , ethambutol , and rifampin . Although CLSI breakpoints are not established for M szulgai to confirm susceptibility of this pathogen due to the rarity of this organism in NTM infections, published case reports have reported successful clinical outcomes with this regimen in the primary literature.   LFTs at last visit on 12/22 were WNL including albumin 4.4, total bilirubin 0.6, alkaline phosphatase 44, AST 21, and ALT 19.   Labs:  Will recheck LFTs today to ensure tolerability with current antibiotic regimen  Plan: - LFTs today - Discussed instructions for taking antibiotics and adherence to regimen - Follow up in *** with ***  Maurilio Patten, PharmD PGY1 Pharmacy Resident [facility] 05/03/2024 6:54 PM   "

## 2024-05-04 ENCOUNTER — Other Ambulatory Visit (HOSPITAL_COMMUNITY): Payer: Self-pay

## 2024-05-04 ENCOUNTER — Other Ambulatory Visit: Payer: Self-pay

## 2024-05-04 ENCOUNTER — Ambulatory Visit: Admitting: Pharmacist

## 2024-05-04 ENCOUNTER — Other Ambulatory Visit (HOSPITAL_BASED_OUTPATIENT_CLINIC_OR_DEPARTMENT_OTHER): Payer: Self-pay

## 2024-05-04 DIAGNOSIS — A319 Mycobacterial infection, unspecified: Secondary | ICD-10-CM

## 2024-05-04 DIAGNOSIS — K760 Fatty (change of) liver, not elsewhere classified: Secondary | ICD-10-CM | POA: Diagnosis not present

## 2024-05-04 DIAGNOSIS — N401 Enlarged prostate with lower urinary tract symptoms: Secondary | ICD-10-CM

## 2024-05-04 NOTE — Progress Notes (Signed)
 Perry Zamora

## 2024-05-05 ENCOUNTER — Other Ambulatory Visit (HOSPITAL_COMMUNITY): Payer: Self-pay

## 2024-05-05 ENCOUNTER — Encounter: Payer: Self-pay | Admitting: Urology

## 2024-05-05 ENCOUNTER — Other Ambulatory Visit: Payer: Self-pay

## 2024-05-05 ENCOUNTER — Ambulatory Visit: Admitting: Urology

## 2024-05-05 VITALS — BP 118/78 | HR 87 | Ht 63.0 in | Wt 165.0 lb

## 2024-05-05 DIAGNOSIS — R972 Elevated prostate specific antigen [PSA]: Secondary | ICD-10-CM

## 2024-05-05 DIAGNOSIS — N401 Enlarged prostate with lower urinary tract symptoms: Secondary | ICD-10-CM | POA: Insufficient documentation

## 2024-05-05 DIAGNOSIS — R3129 Other microscopic hematuria: Secondary | ICD-10-CM | POA: Insufficient documentation

## 2024-05-05 LAB — COMPREHENSIVE METABOLIC PANEL WITH GFR
AG Ratio: 1.8 (calc) (ref 1.0–2.5)
ALT: 19 U/L (ref 9–46)
AST: 19 U/L (ref 10–35)
Albumin: 4.6 g/dL (ref 3.6–5.1)
Alkaline phosphatase (APISO): 49 U/L (ref 35–144)
BUN: 11 mg/dL (ref 7–25)
CO2: 29 mmol/L (ref 20–32)
Calcium: 9.4 mg/dL (ref 8.6–10.3)
Chloride: 99 mmol/L (ref 98–110)
Creat: 0.85 mg/dL (ref 0.70–1.35)
Globulin: 2.5 g/dL (ref 1.9–3.7)
Glucose, Bld: 98 mg/dL (ref 65–99)
Potassium: 3.8 mmol/L (ref 3.5–5.3)
Sodium: 135 mmol/L (ref 135–146)
Total Bilirubin: 0.4 mg/dL (ref 0.2–1.2)
Total Protein: 7.1 g/dL (ref 6.1–8.1)
eGFR: 99 mL/min/1.73m2

## 2024-05-05 LAB — MICROSCOPIC EXAMINATION

## 2024-05-05 LAB — URINALYSIS, ROUTINE W REFLEX MICROSCOPIC
Bilirubin, UA: NEGATIVE
Glucose, UA: NEGATIVE
Ketones, UA: NEGATIVE
Leukocytes,UA: NEGATIVE
Nitrite, UA: NEGATIVE
Protein,UA: NEGATIVE
Specific Gravity, UA: 1.015 (ref 1.005–1.030)
Urobilinogen, Ur: 0.2 mg/dL (ref 0.2–1.0)
pH, UA: 7 (ref 5.0–7.5)

## 2024-05-05 LAB — CBC
HCT: 41.9 % (ref 39.4–51.1)
Hemoglobin: 13.7 g/dL (ref 13.2–17.1)
MCH: 31.4 pg (ref 27.0–33.0)
MCHC: 32.7 g/dL (ref 31.6–35.4)
MCV: 96.1 fL (ref 81.4–101.7)
MPV: 9.7 fL (ref 7.5–12.5)
Platelets: 297 Thousand/uL (ref 140–400)
RBC: 4.36 Million/uL (ref 4.20–5.80)
RDW: 11.6 % (ref 11.0–15.0)
WBC: 4.8 Thousand/uL (ref 3.8–10.8)

## 2024-05-05 LAB — PSA: PSA: 4.73 ng/mL — ABNORMAL HIGH

## 2024-05-05 NOTE — Progress Notes (Signed)
 "  Assessment: 1. Elevated PSA   2. Benign prostatic hyperplasia with lower urinary tract symptoms, symptom details unspecified   3. Microscopic hematuria     Plan: I personally reviewed the patient's chart including provider notes, lab results. I discussed further evaluation of the elevated PSA with the patient today including a prostate biopsy.  He would like to avoid a biopsy if possible due to his recent medical issues.  I discussed further evaluation with urine biomarker testing.  He is interested in proceeding with this. Will order a MPS 2.0 Urine sent for hematuria profile for follow-up of his microscopic hematuria. Will contact him with results and to arrange follow-up. Continue tamsulosin    Chief Complaint: Chief Complaint  Patient presents with   Elevated PSA    HPI: Perry Zamora is a 64 y.o. male who presents for continued evaluation of elevated PSA and BPH with LUTS. He was previously seen by Dr. Shona in April 2025. He initially presented in December 2024 with a history of elevated PSA with lower urinary tract symptoms improved on tamsulosin  and microscopic hematuria.  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   Patient reported significant improvement in his lower urinary tract symptoms following the colovesical fistula repair.  IPSS = 11.   No other prior GU history. Patient was started on tamsulosin  0.4 mg daily by his PCP 11/2022.  He reported that this helped significantly.     PSA data: 05/2022             10.52 11/2022             4.88 03/2023           11.74   iso psa 8.7.    06/2023             5.7 (18% free) 12/25  6.06 1/26  4.73   There is no known family history of prostate cancer.   DRE from 4/25 revealed a large 50 to 60 g gland without nodules or induration. Prostate MRI from 4/25 showed a prostate volume of 63 cm and no focal lesion of  intermediate or higher suspicion for prostate cancer. Further evaluation with a prostate biopsy was recommended but has not been scheduled to date.  He returns today for follow-up.  He continues on tamsulosin  0.4 mg daily.  His urinary symptoms are fairly well-controlled.  He has had some increased frequency and urgency which he associates with diuretic use.  No dysuria or gross hematuria. IPSS = 19/4.   Portions of the above documentation were copied from a prior visit for review purposes only.  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   Tenosynovitis of right wrist 03/25/2024    PSH: Past Surgical History:  Procedure Laterality Date   BASAL CELL CARCINOMA EXCISION     FLEXIBLE SIGMOIDOSCOPY N/A 06/25/2023   Procedure: KINGSTON SIDE;  Surgeon: Sheldon Standing, MD;  Location: [facility];  Service: General;  Laterality: N/A;   TENOSYNOVECTOMY Right 03/02/2024   Procedure: TENOSYNOVECTOMY;  Surgeon: Camella Fallow, MD;  Location: [facility] OR;  Service: Orthopedics;  Laterality: Right;    SH: Social History[2]  ROS: Constitutional:  Negative for fever, chills, weight loss CV: Negative  for chest pain, previous MI, hypertension Respiratory:  Negative for shortness of breath, wheezing, sleep apnea, frequent cough GI:  Negative for nausea, vomiting, bloody stool, GERD  PE: BP 118/78   Pulse 87   Ht 5' 3 (1.6 m)   Wt 165 lb (74.8 kg)   BMI 29.23 kg/m  GENERAL APPEARANCE:  Well appearing, well developed, well nourished, NAD HEENT:  Atraumatic, normocephalic, oropharynx clear NECK:  Supple without lymphadenopathy or thyromegaly ABDOMEN:  Soft, non-tender, no masses EXTREMITIES:  Moves all extremities well, without clubbing, cyanosis, or edema NEUROLOGIC:  Alert and oriented x 3,  normal gait, CN II-XII grossly intact MENTAL STATUS:  appropriate BACK:  Non-tender to palpation, No CVAT SKIN:  Warm, dry, and intact   Results: Results for orders placed or performed in visit on 05/05/24 (from the past 24 hours)  Microscopic Examination   Collection Time: 05/05/24 12:00 AM   Urine  Result Value Ref Range   WBC, UA 0-5 0 - 5 /hpf   RBC, Urine 11-30 (A) 0 - 2 /hpf   Epithelial Cells (non renal) 0-10 0 - 10 /hpf   Bacteria, UA Few None seen/Few  Urinalysis, Routine w reflex microscopic   Collection Time: 05/05/24 12:00 AM  Result Value Ref Range   Specific Gravity, UA 1.015 1.005 - 1.030   pH, UA 7.0 5.0 - 7.5   Color, UA Yellow Yellow   Appearance Ur Clear Clear   Leukocytes,UA Negative Negative   Protein,UA Negative Negative/Trace   Glucose, UA Negative Negative   Ketones, UA Negative Negative   RBC, UA 2+ (A) Negative   Bilirubin, UA Negative Negative   Urobilinogen, Ur 0.2 0.2 - 1.0 mg/dL   Nitrite, UA Negative Negative   Microscopic Examination See below:        [1]  Allergies Allergen Reactions   Gabapentin  Other (See Comments)    Crazy dreams  [2]  Social History Tobacco Use   Smoking status: Never   Smokeless tobacco: Never  Vaping Use   Vaping status: Never Used  Substance Use Topics   Alcohol use: Not Currently    Alcohol/week: 3.0 standard drinks of alcohol    Types: 3 Standard drinks or equivalent per week   Drug use: Never   "

## 2024-05-07 ENCOUNTER — Other Ambulatory Visit: Payer: Self-pay

## 2024-05-07 ENCOUNTER — Other Ambulatory Visit (HOSPITAL_COMMUNITY): Payer: Self-pay

## 2024-05-10 ENCOUNTER — Encounter: Payer: Self-pay | Admitting: Infectious Disease

## 2024-05-10 DIAGNOSIS — R12 Heartburn: Secondary | ICD-10-CM

## 2024-05-11 ENCOUNTER — Other Ambulatory Visit (HOSPITAL_COMMUNITY): Payer: Self-pay

## 2024-05-11 ENCOUNTER — Other Ambulatory Visit: Payer: Self-pay | Admitting: Infectious Disease

## 2024-05-11 NOTE — Telephone Encounter (Signed)
 Spoke with Dr. Fleeta Rothman and received verbal orders for Prevacid 30 mg PO daily.   Lilton Pare, BSN, RN

## 2024-05-17 ENCOUNTER — Telehealth: Payer: Self-pay

## 2024-05-17 NOTE — Telephone Encounter (Signed)
 LVM for patient, in regards to rescheduling 2/3 visit. Delayed office opening at the moment due to weather.

## 2024-05-18 ENCOUNTER — Encounter: Payer: Self-pay | Admitting: Urology

## 2024-05-18 ENCOUNTER — Telehealth: Payer: Self-pay | Admitting: Urology

## 2024-05-18 ENCOUNTER — Ambulatory Visit: Admitting: Family Medicine

## 2024-05-19 NOTE — Progress Notes (Unsigned)
" ° °  Acute Office Visit  Subjective:     Patient ID: Perry Zamora, male    DOB: 24-Sep-1960, 64 y.o.   MRN: [id number]  No chief complaint on file.   HPI  Discussed the use of AI scribe software for clinical note transcription with the patient, who gave verbal consent to proceed.  History of Present Illness      ROS Per HPI      Objective:    There were no vitals taken for this visit.   Physical Exam Vitals and nursing note reviewed.  Constitutional:      General: He is not in acute distress.    Appearance: Normal appearance.  HENT:     Head: Normocephalic and atraumatic.     Right Ear: External ear normal.     Left Ear: External ear normal.     Nose: Nose normal.     Mouth/Throat:     Mouth: Mucous membranes are moist.     Pharynx: Oropharynx is clear.  Eyes:     Extraocular Movements: Extraocular movements intact.  Cardiovascular:     Rate and Rhythm: Normal rate and regular rhythm.     Pulses: Normal pulses.     Heart sounds: Normal heart sounds.  Pulmonary:     Effort: Pulmonary effort is normal. No respiratory distress.     Breath sounds: Normal breath sounds. No wheezing, rhonchi or rales.  Musculoskeletal:        General: Normal range of motion.     Cervical back: Normal range of motion.     Right lower leg: No edema.     Left lower leg: No edema.  Lymphadenopathy:     Cervical: No cervical adenopathy.  Skin:    General: Skin is warm and dry.  Neurological:     General: No focal deficit present.     Mental Status: He is alert and oriented to person, place, and time.  Psychiatric:        Mood and Affect: Mood normal.        Behavior: Behavior normal.     No results found for any visits on 05/20/24.      Assessment & Plan:   Assessment and Plan Assessment & Plan      No orders of the defined types were placed in this encounter.    No orders of the defined types were placed in this encounter.   No follow-ups on file.  Corean LITTIE Ku, FNP  "

## 2024-05-20 ENCOUNTER — Encounter: Payer: Self-pay | Admitting: Family Medicine

## 2024-05-20 ENCOUNTER — Other Ambulatory Visit (HOSPITAL_COMMUNITY): Payer: Self-pay

## 2024-05-20 ENCOUNTER — Other Ambulatory Visit (HOSPITAL_BASED_OUTPATIENT_CLINIC_OR_DEPARTMENT_OTHER): Payer: Self-pay

## 2024-05-20 ENCOUNTER — Ambulatory Visit: Admitting: Family Medicine

## 2024-05-20 ENCOUNTER — Other Ambulatory Visit: Payer: Self-pay

## 2024-05-20 ENCOUNTER — Ambulatory Visit: Admitting: Emergency Medicine

## 2024-05-20 VITALS — BP 112/82 | HR 85 | Temp 99.0°F | Ht 63.0 in | Wt 182.4 lb

## 2024-05-20 DIAGNOSIS — A319 Mycobacterial infection, unspecified: Secondary | ICD-10-CM

## 2024-05-20 DIAGNOSIS — K219 Gastro-esophageal reflux disease without esophagitis: Secondary | ICD-10-CM

## 2024-05-20 DIAGNOSIS — M2559 Pain in other specified joint: Secondary | ICD-10-CM

## 2024-05-20 MED ORDER — FAMOTIDINE 20 MG PO TABS
20.0000 mg | ORAL_TABLET | Freq: Two times a day (BID) | ORAL | 1 refills | Status: AC
  Filled 2024-05-20: qty 60, 30d supply, fill #0

## 2024-05-20 MED ORDER — DICLOFENAC SODIUM 75 MG PO TBEC
75.0000 mg | DELAYED_RELEASE_TABLET | Freq: Two times a day (BID) | ORAL | 1 refills | Status: AC | PRN
  Filled 2024-05-20 (×2): qty 30, 15d supply, fill #0

## 2024-05-20 NOTE — Patient Instructions (Addendum)
 Diclofenac  twice a day with food.   Famotidine  20mg  twice a day.  Referral to physical therapy to help with joint pain.   Follow up with infectious disease as scheduled.   Follow-up with me for new or worsening symptoms.

## 2024-05-28 ENCOUNTER — Ambulatory Visit: Admitting: Physical Therapy

## 2024-06-28 ENCOUNTER — Ambulatory Visit: Payer: Self-pay | Admitting: Infectious Disease

## 2024-11-01 ENCOUNTER — Ambulatory Visit: Admitting: Urology

## 2025-02-08 ENCOUNTER — Ambulatory Visit: Admitting: Physician Assistant
